# Patient Record
Sex: Female | Born: 1970
Health system: Southern US, Community
[De-identification: ages and names within clinical notes are randomized; demographics above are authoritative.]

## PROBLEM LIST (undated history)

## (undated) ENCOUNTER — Emergency Department: Payer: Commercial Managed Care - PPO

## (undated) DIAGNOSIS — G8929 Other chronic pain: Secondary | ICD-10-CM

## (undated) DIAGNOSIS — G35D Multiple sclerosis, unspecified: Secondary | ICD-10-CM

## (undated) DIAGNOSIS — R635 Abnormal weight gain: Secondary | ICD-10-CM

## (undated) DIAGNOSIS — R5383 Other fatigue: Secondary | ICD-10-CM

## (undated) DIAGNOSIS — J302 Other seasonal allergic rhinitis: Secondary | ICD-10-CM

## (undated) DIAGNOSIS — I499 Cardiac arrhythmia, unspecified: Secondary | ICD-10-CM

## (undated) DIAGNOSIS — G43909 Migraine, unspecified, not intractable, without status migrainosus: Secondary | ICD-10-CM

## (undated) DIAGNOSIS — R0683 Snoring: Secondary | ICD-10-CM

## (undated) DIAGNOSIS — I10 Essential (primary) hypertension: Secondary | ICD-10-CM

## (undated) DIAGNOSIS — R519 Headache, unspecified: Secondary | ICD-10-CM

## (undated) DIAGNOSIS — G35 Multiple sclerosis: Secondary | ICD-10-CM

## (undated) DIAGNOSIS — Z780 Asymptomatic menopausal state: Secondary | ICD-10-CM

## (undated) HISTORY — DX: Snoring: R06.83

## (undated) HISTORY — DX: Other chronic pain: G89.29

## (undated) HISTORY — PX: TONSILLECTOMY: SUR1361

## (undated) HISTORY — DX: Asymptomatic menopausal state: Z78.0

## (undated) HISTORY — DX: Headache, unspecified: R51.9

## (undated) HISTORY — DX: Other fatigue: R53.83

## (undated) HISTORY — DX: Abnormal weight gain: R63.5

## (undated) HISTORY — DX: Multiple sclerosis, unspecified: G35.D

## (undated) HISTORY — DX: Multiple sclerosis: G35

## (undated) HISTORY — PX: TONSILLECTOMY AND ADENOIDECTOMY: SHX28

## (undated) HISTORY — DX: Cardiac arrhythmia, unspecified: I49.9

---

## 2001-08-25 ENCOUNTER — Emergency Department (HOSPITAL_COMMUNITY): Admission: EM | Admit: 2001-08-25 | Discharge: 2001-08-25 | Payer: Self-pay

## 2001-09-17 ENCOUNTER — Emergency Department (HOSPITAL_COMMUNITY): Admission: EM | Admit: 2001-09-17 | Discharge: 2001-09-18 | Payer: Self-pay | Admitting: Emergency Medicine

## 2002-04-02 ENCOUNTER — Other Ambulatory Visit: Admission: RE | Admit: 2002-04-02 | Discharge: 2002-04-02 | Payer: Self-pay | Admitting: Family Medicine

## 2006-12-10 ENCOUNTER — Emergency Department (HOSPITAL_COMMUNITY): Admission: EM | Admit: 2006-12-10 | Discharge: 2006-12-10 | Payer: Self-pay | Admitting: *Deleted

## 2007-09-17 ENCOUNTER — Ambulatory Visit: Payer: Self-pay | Admitting: Gastroenterology

## 2007-09-23 ENCOUNTER — Encounter: Payer: Self-pay | Admitting: Internal Medicine

## 2007-09-23 ENCOUNTER — Ambulatory Visit: Payer: Self-pay | Admitting: Internal Medicine

## 2007-09-30 ENCOUNTER — Ambulatory Visit: Payer: Self-pay | Admitting: Gastroenterology

## 2007-10-08 ENCOUNTER — Ambulatory Visit: Payer: Self-pay | Admitting: Gastroenterology

## 2007-10-15 ENCOUNTER — Ambulatory Visit: Payer: Self-pay | Admitting: Gastroenterology

## 2007-10-23 ENCOUNTER — Ambulatory Visit: Payer: Self-pay | Admitting: Gastroenterology

## 2007-11-06 ENCOUNTER — Ambulatory Visit: Payer: Self-pay | Admitting: Gastroenterology

## 2007-12-30 ENCOUNTER — Ambulatory Visit: Payer: Self-pay | Admitting: Gastroenterology

## 2008-04-06 ENCOUNTER — Emergency Department (HOSPITAL_COMMUNITY): Admission: EM | Admit: 2008-04-06 | Discharge: 2008-04-06 | Payer: Self-pay | Admitting: Family Medicine

## 2008-08-16 ENCOUNTER — Emergency Department (HOSPITAL_COMMUNITY): Admission: EM | Admit: 2008-08-16 | Discharge: 2008-08-16 | Payer: Self-pay | Admitting: Emergency Medicine

## 2011-04-25 LAB — POCT I-STAT CREATININE: Creatinine, Ser: 0.9

## 2011-04-25 LAB — I-STAT 8, (EC8 V) (CONVERTED LAB)
Acid-Base Excess: 2
Bicarbonate: 26.2 — ABNORMAL HIGH
HCT: 47 — ABNORMAL HIGH
Operator id: 208821
TCO2: 27
pCO2, Ven: 39.1 — ABNORMAL LOW

## 2011-06-06 ENCOUNTER — Other Ambulatory Visit: Payer: Self-pay | Admitting: Family Medicine

## 2011-06-06 ENCOUNTER — Ambulatory Visit
Admission: RE | Admit: 2011-06-06 | Discharge: 2011-06-06 | Disposition: A | Discharge status: Home / Self Care | Payer: 59 | Source: Ambulatory Visit | Attending: Family Medicine | Admitting: Family Medicine

## 2011-06-06 DIAGNOSIS — M79673 Pain in unspecified foot: Secondary | ICD-10-CM

## 2012-05-11 ENCOUNTER — Other Ambulatory Visit: Payer: Self-pay | Admitting: Family Medicine

## 2012-05-11 DIAGNOSIS — Z1231 Encounter for screening mammogram for malignant neoplasm of breast: Secondary | ICD-10-CM

## 2012-06-12 ENCOUNTER — Ambulatory Visit
Admission: RE | Admit: 2012-06-12 | Discharge: 2012-06-12 | Disposition: A | Discharge status: Home / Self Care | Payer: 59 | Source: Ambulatory Visit | Attending: Family Medicine | Admitting: Family Medicine

## 2012-06-12 DIAGNOSIS — Z1231 Encounter for screening mammogram for malignant neoplasm of breast: Secondary | ICD-10-CM

## 2013-07-29 ENCOUNTER — Encounter (HOSPITAL_BASED_OUTPATIENT_CLINIC_OR_DEPARTMENT_OTHER): Payer: Self-pay | Admitting: Emergency Medicine

## 2013-07-29 ENCOUNTER — Emergency Department (HOSPITAL_BASED_OUTPATIENT_CLINIC_OR_DEPARTMENT_OTHER)
Admission: EM | Admit: 2013-07-29 | Discharge: 2013-07-29 | Disposition: A | Discharge status: Home / Self Care | Payer: 59 | Attending: Emergency Medicine | Admitting: Emergency Medicine

## 2013-07-29 DIAGNOSIS — G43909 Migraine, unspecified, not intractable, without status migrainosus: Secondary | ICD-10-CM | POA: Insufficient documentation

## 2013-07-29 DIAGNOSIS — Z8709 Personal history of other diseases of the respiratory system: Secondary | ICD-10-CM | POA: Insufficient documentation

## 2013-07-29 HISTORY — DX: Other seasonal allergic rhinitis: J30.2

## 2013-07-29 HISTORY — DX: Migraine, unspecified, not intractable, without status migrainosus: G43.909

## 2013-07-29 MED ORDER — DEXAMETHASONE SODIUM PHOSPHATE 10 MG/ML IJ SOLN
10.0000 mg | Freq: Once | INTRAMUSCULAR | Status: AC
  Administered 2013-07-29: 10 mg via INTRAVENOUS
  Filled 2013-07-29: qty 1

## 2013-07-29 MED ORDER — METOCLOPRAMIDE HCL 5 MG/ML IJ SOLN
10.0000 mg | Freq: Once | INTRAMUSCULAR | Status: AC
  Administered 2013-07-29: 10 mg via INTRAVENOUS
  Filled 2013-07-29: qty 2

## 2013-07-29 MED ORDER — SODIUM CHLORIDE 0.9 % IV BOLUS (SEPSIS)
1000.0000 mL | Freq: Once | INTRAVENOUS | Status: AC
  Administered 2013-07-29: 1000 mL via INTRAVENOUS

## 2013-07-29 MED ORDER — DIPHENHYDRAMINE HCL 50 MG/ML IJ SOLN
25.0000 mg | Freq: Once | INTRAMUSCULAR | Status: AC
  Administered 2013-07-29: 25 mg via INTRAVENOUS
  Filled 2013-07-29: qty 1

## 2013-07-29 NOTE — ED Provider Notes (Addendum)
CSN: [id number]     Arrival date & time 07/29/13  2007 History  This chart was scribed for Blanchie Dessert, MD by Zettie Pho, ED Scribe. This patient was seen in room MH05/MH05 and the patient's care was started at 8:33 PM.    Chief Complaint  Patient presents with  . Migraine   The history is provided by the patient. No language interpreter was used.   HPI Comments: Jamie Watson is a 43 y.o. Female with a history of migraines who presents to the Emergency Department complaining of a constant headache to the left side of her head with associated nausea and multiple episodes of emesis onset about 8.5 hours ago. She states her current symptoms feel similar to her past migraines, but is not as severe. She reports taking Excedrin at home, which she states is usually effective, but was not able to provide relief today. She denies photophobia,  fever, congestion, numbness, paresthesias. Patient has no other pertinent medical history.   Past Medical History  Diagnosis Date  . Migraine   . Seasonal allergies    Past Surgical History  Procedure Laterality Date  . Tonsillectomy     No family history on file. History  Substance Use Topics  . Smoking status: Never Smoker   . Smokeless tobacco: Not on file  . Alcohol Use: Yes   OB History   Grav Para Term Preterm Abortions TAB SAB Ect Mult Living                 Review of Systems  A complete 10 system review of systems was obtained and all systems are negative except as noted in the HPI and PMH.   Allergies  Review of patient's allergies indicates no known allergies.  Home Medications  No current outpatient prescriptions on file.  Triage Vitals: BP 151/111  Pulse 91  Temp(Src) 98.8 F (37.1 C) (Oral)  Resp 20  Ht 5\' 6"  (1.676 m)  Wt 190 lb (86.183 kg)  BMI 30.68 kg/m2  SpO2 100%  LMP 04/28/2013  Physical Exam  Nursing note and vitals reviewed. Constitutional: She is oriented to person, place, and time. She appears  well-developed and well-nourished. No distress.  HENT:  Head: Normocephalic and atraumatic.  Eyes: Conjunctivae and EOM are normal. Pupils are equal, round, and reactive to light.  Mild photophobia.   Neck: Normal range of motion. Neck supple.  Pulmonary/Chest: Effort normal. No respiratory distress.  Abdominal: She exhibits no distension.  Musculoskeletal: Normal range of motion.  Neurological: She is alert and oriented to person, place, and time.  Skin: Skin is warm and dry.  Psychiatric: She has a normal mood and affect. Her behavior is normal.    ED Course  Procedures (including critical care time)  DIAGNOSTIC STUDIES: Oxygen Saturation is 100% on room air, normal by my interpretation.    COORDINATION OF CARE: 8:35 PM- Will order a migraine cocktail (IV fluids, Reglan, Benadryl, and Decadron) to manage symptoms. Discussed treatment plan with patient at bedside and patient verbalized agreement.     Labs Review Labs Reviewed - No data to display Imaging Review No results found.  EKG Interpretation   None       MDM   1. Migraine     Pt with typical migraine HA without sx suggestive of SAH(sudden onset, worst of life, or deficits), infection, or cavernous vein thrombosis.  Normal neuro exam and vital signs. Will give HA cocktail and on re-eval.  9:54 PM Pt feeling much  better and ready to go home. I personally performed the services described in this documentation, which was scribed in my presence.  The recorded information has been reviewed and considered.     Blanchie Dessert, MD 07/29/13 0370  Blanchie Dessert, MD 07/29/13 2155

## 2013-07-29 NOTE — Discharge Instructions (Signed)
Migraine Headache A migraine headache is an intense, throbbing pain on one or both sides of your head. A migraine can last for 30 minutes to several hours. CAUSES  The exact cause of a migraine headache is not always known. However, a migraine may be caused when nerves in the brain become irritated and release chemicals that cause inflammation. This causes pain. Certain things may also trigger migraines, such as:  Alcohol.  Smoking.  Stress.  Menstruation.  Aged cheeses.  Foods or drinks that contain nitrates, glutamate, aspartame, or tyramine.  Lack of sleep.  Chocolate.  Caffeine.  Hunger.  Physical exertion.  Fatigue.  Medicines used to treat chest pain (nitroglycerine), birth control pills, estrogen, and some blood pressure medicines. SIGNS AND SYMPTOMS  Pain on one or both sides of your head.  Pulsating or throbbing pain.  Severe pain that prevents daily activities.  Pain that is aggravated by any physical activity.  Nausea, vomiting, or both.  Dizziness.  Pain with exposure to bright lights, loud noises, or activity.  General sensitivity to bright lights, loud noises, or smells. Before you get a migraine, you may get warning signs that a migraine is coming (aura). An aura may include:  Seeing flashing lights.  Seeing bright spots, halos, or zig-zag lines.  Having tunnel vision or blurred vision.  Having feelings of numbness or tingling.  Having trouble talking.  Having muscle weakness. DIAGNOSIS  A migraine headache is often diagnosed based on:  Symptoms.  Physical exam.  A CT scan or MRI of your head. These imaging tests cannot diagnose migraines, but they can help rule out other causes of headaches. TREATMENT Medicines may be given for pain and nausea. Medicines can also be given to help prevent recurrent migraines.  HOME CARE INSTRUCTIONS  Only take over-the-counter or prescription medicines for pain or discomfort as directed by your  health care provider. The use of long-term narcotics is not recommended.  Lie down in a dark, quiet room when you have a migraine.  Keep a journal to find out what may trigger your migraine headaches. For example, write down:  What you eat and drink.  How much sleep you get.  Any change to your diet or medicines.  Limit alcohol consumption.  Quit smoking if you smoke.  Get 7 9 hours of sleep, or as recommended by your health care provider.  Limit stress.  Keep lights dim if bright lights bother you and make your migraines worse. SEEK IMMEDIATE MEDICAL CARE IF:   Your migraine becomes severe.  You have a fever.  You have a stiff neck.  You have vision loss.  You have muscular weakness or loss of muscle control.  You start losing your balance or have trouble walking.  You feel faint or pass out.  You have severe symptoms that are different from your first symptoms. MAKE SURE YOU:   Understand these instructions.  Will watch your condition.  Will get help right away if you are not doing well or get worse. Document Released: 06/24/2005 Document Revised: 04/14/2013 Document Reviewed: 03/01/2013 [employer] Patient Information 2014 [employer].  

## 2013-07-29 NOTE — ED Notes (Signed)
Migraine, vomiting since 12pm

## 2014-03-07 ENCOUNTER — Other Ambulatory Visit: Payer: Self-pay

## 2014-03-07 DIAGNOSIS — Z1231 Encounter for screening mammogram for malignant neoplasm of breast: Secondary | ICD-10-CM

## 2014-03-16 ENCOUNTER — Ambulatory Visit
Admission: RE | Admit: 2014-03-16 | Discharge: 2014-03-16 | Disposition: A | Discharge status: Home / Self Care | Payer: 59 | Source: Ambulatory Visit

## 2014-03-16 DIAGNOSIS — Z1231 Encounter for screening mammogram for malignant neoplasm of breast: Secondary | ICD-10-CM

## 2014-03-18 ENCOUNTER — Other Ambulatory Visit: Payer: Self-pay | Admitting: Obstetrics and Gynecology

## 2014-03-18 DIAGNOSIS — R928 Other abnormal and inconclusive findings on diagnostic imaging of breast: Secondary | ICD-10-CM

## 2014-03-30 ENCOUNTER — Other Ambulatory Visit: Payer: 59

## 2014-04-07 ENCOUNTER — Ambulatory Visit
Admission: RE | Admit: 2014-04-07 | Discharge: 2014-04-07 | Disposition: A | Discharge status: Home / Self Care | Payer: 59 | Source: Ambulatory Visit | Attending: Obstetrics and Gynecology | Admitting: Obstetrics and Gynecology

## 2014-04-07 DIAGNOSIS — R928 Other abnormal and inconclusive findings on diagnostic imaging of breast: Secondary | ICD-10-CM

## 2015-04-07 ENCOUNTER — Encounter (HOSPITAL_COMMUNITY): Payer: Self-pay

## 2015-04-07 ENCOUNTER — Emergency Department (HOSPITAL_COMMUNITY): Payer: 59

## 2015-04-07 ENCOUNTER — Emergency Department (HOSPITAL_COMMUNITY)
Admission: EM | Admit: 2015-04-07 | Discharge: 2015-04-07 | Disposition: A | Discharge status: Home / Self Care | Payer: 59 | Attending: Emergency Medicine | Admitting: Emergency Medicine

## 2015-04-07 DIAGNOSIS — S29019D Strain of muscle and tendon of unspecified wall of thorax, subsequent encounter: Secondary | ICD-10-CM

## 2015-04-07 DIAGNOSIS — S29012D Strain of muscle and tendon of back wall of thorax, subsequent encounter: Secondary | ICD-10-CM | POA: Diagnosis not present

## 2015-04-07 DIAGNOSIS — Z8679 Personal history of other diseases of the circulatory system: Secondary | ICD-10-CM | POA: Insufficient documentation

## 2015-04-07 DIAGNOSIS — X58XXXD Exposure to other specified factors, subsequent encounter: Secondary | ICD-10-CM | POA: Insufficient documentation

## 2015-04-07 DIAGNOSIS — M542 Cervicalgia: Secondary | ICD-10-CM | POA: Diagnosis not present

## 2015-04-07 DIAGNOSIS — M546 Pain in thoracic spine: Secondary | ICD-10-CM | POA: Diagnosis present

## 2015-04-07 MED ORDER — METHOCARBAMOL 500 MG PO TABS
1000.0000 mg | ORAL_TABLET | Freq: Once | ORAL | Status: AC
  Administered 2015-04-07: 1000 mg via ORAL
  Filled 2015-04-07: qty 2

## 2015-04-07 MED ORDER — TRAMADOL HCL 50 MG PO TABS: 50.0000 mg | ORAL_TABLET | Freq: Four times a day (QID) | ORAL | Status: DC | PRN

## 2015-04-07 MED ORDER — METHOCARBAMOL 500 MG PO TABS: 1000.0000 mg | ORAL_TABLET | Freq: Three times a day (TID) | ORAL | Status: DC | PRN

## 2015-04-07 MED ORDER — KETOROLAC TROMETHAMINE 60 MG/2ML IM SOLN
60.0000 mg | Freq: Once | INTRAMUSCULAR | Status: AC
  Administered 2015-04-07: 60 mg via INTRAMUSCULAR
  Filled 2015-04-07: qty 2

## 2015-04-07 NOTE — ED Notes (Signed)
Per Pt she started having severe back and neck pain two weeks ago; pt states she has seen PCP and was given muscle relaxers and Motrin; Pt states it has not helped; pt denies injury; Pt a&o on arrival

## 2015-04-07 NOTE — ED Provider Notes (Signed)
CSN: [id number]     Arrival date & time 04/07/15  0504 History   First MD Initiated Contact with Patient 04/07/15 0515     Chief Complaint  Patient presents with  . Back Pain  . Neck Pain     (Consider location/radiation/quality/duration/timing/severity/associated sxs/prior Treatment) HPI Patient presents with several weeks of thoracic back pain. States she has had pain in the right trapezius muscle which is improved but now is having pain medial to the right scapula. Pain is worse with movement and palpation. Describes spasms. She denies any known heavy lifting or injury. Has been seen by her primary physician's who is treated her with ibuprofen and a muscle relaxant but is of little help. No fever or chills. No shortness of breath. No weakness or numbness. Past Medical History  Diagnosis Date  . Migraine   . Seasonal allergies    Past Surgical History  Procedure Laterality Date  . Tonsillectomy     No family history on file. Social History  Substance Use Topics  . Smoking status: Never Smoker   . Smokeless tobacco: None  . Alcohol Use: Yes   OB History    No data available     Review of Systems  Constitutional: Negative for fever and chills.  Respiratory: Negative for shortness of breath.   Cardiovascular: Negative for chest pain.  Gastrointestinal: Negative for nausea, vomiting and abdominal pain.  Musculoskeletal: Positive for myalgias, back pain and neck pain. Negative for neck stiffness.  Skin: Negative for rash and wound.  Neurological: Negative for dizziness, weakness, light-headedness, numbness and headaches.  All other systems reviewed and are negative.     Allergies  Review of patient's allergies indicates no known allergies.  Home Medications   Prior to Admission medications   Medication Sig Start Date End Date Taking? Authorizing Provider  methocarbamol (ROBAXIN) 500 MG tablet Take 2 tablets (1,000 mg total) by mouth every 8 (eight) hours as needed  for muscle spasms. 04/07/15   Julianne Rice, MD  traMADol (ULTRAM) 50 MG tablet Take 1 tablet (50 mg total) by mouth every 6 (six) hours as needed. 04/07/15   Julianne Rice, MD   BP 154/107 mmHg  Pulse 84  Temp(Src) 98 F (36.7 C) (Oral)  Resp 17  Ht 5\' 6"  (1.676 m)  Wt 200 lb (90.719 kg)  BMI 32.30 kg/m2  SpO2 98% Physical Exam  Constitutional: She is oriented to person, place, and time. She appears well-developed and well-nourished. No distress.  HENT:  Head: Normocephalic and atraumatic.  Mouth/Throat: Oropharynx is clear and moist.  Eyes: EOM are normal. Pupils are equal, round, and reactive to light.  Neck: Normal range of motion. Neck supple.  No midline cervical tenderness to palpation.  Cardiovascular: Normal rate and regular rhythm.   Pulmonary/Chest: Effort normal and breath sounds normal. No respiratory distress. She has no wheezes. She has no rales.  Abdominal: Soft. Bowel sounds are normal.  Musculoskeletal: Normal range of motion. She exhibits tenderness. She exhibits no edema.  Patient with tenderness to palpation over the right trapezius and right rhomboid muscles. No midline thoracic tenderness  Neurological: She is alert and oriented to person, place, and time.  5/5 grip strength bilaterally. Sensation is fully intact.  Skin: Skin is warm and dry. No rash noted. No erythema.  Psychiatric: She has a normal mood and affect. Her behavior is normal.  Nursing note and vitals reviewed.   ED Course  Procedures (including critical care time) Labs Review Labs Reviewed - No  data to display  Imaging Review Dg Thoracic Spine 2 View  04/07/2015   CLINICAL DATA:  Thoracic back pain for 2 weeks.  No known injury.  EXAM: THORACIC SPINE 2 VIEWS  COMPARISON:  None.  FINDINGS: No evidence of fracture, subluxation, endplate erosion, or focal bone lesion. No notable degenerative change.  IMPRESSION: Negative.   Electronically Signed   By: Monte Fantasia M.D.   On: 04/07/2015  06:07   I have personally reviewed and evaluated these images and lab results as part of my medical decision-making.   EKG Interpretation None      MDM   Final diagnoses:  Thoracic myofascial strain, subsequent encounter    Patient with ongoing musculoskeletal thoracic back pain. No definite injuries. Concern that thoracic strain may be due to large breast size. Advised more supportive undergarments and consult with general surgery as needed.    Julianne Rice, MD 04/07/15 [phone]

## 2015-04-07 NOTE — Discharge Instructions (Signed)

## 2015-04-07 NOTE — ED Notes (Signed)
Patient transported to X-ray 

## 2015-04-07 NOTE — ED Notes (Signed)
MD at bedside. 

## 2015-08-11 DIAGNOSIS — J329 Chronic sinusitis, unspecified: Secondary | ICD-10-CM | POA: Diagnosis not present

## 2015-08-13 DIAGNOSIS — B9689 Other specified bacterial agents as the cause of diseases classified elsewhere: Secondary | ICD-10-CM | POA: Diagnosis not present

## 2015-08-13 DIAGNOSIS — J22 Unspecified acute lower respiratory infection: Secondary | ICD-10-CM | POA: Diagnosis not present

## 2015-08-13 DIAGNOSIS — J019 Acute sinusitis, unspecified: Secondary | ICD-10-CM | POA: Diagnosis not present

## 2015-11-13 DIAGNOSIS — Z1231 Encounter for screening mammogram for malignant neoplasm of breast: Secondary | ICD-10-CM | POA: Diagnosis not present

## 2015-11-13 DIAGNOSIS — Z3A34 34 weeks gestation of pregnancy: Secondary | ICD-10-CM | POA: Diagnosis not present

## 2015-11-13 DIAGNOSIS — Z01419 Encounter for gynecological examination (general) (routine) without abnormal findings: Secondary | ICD-10-CM | POA: Diagnosis not present

## 2015-11-13 DIAGNOSIS — Z1151 Encounter for screening for human papillomavirus (HPV): Secondary | ICD-10-CM | POA: Diagnosis not present

## 2015-12-20 DIAGNOSIS — H524 Presbyopia: Secondary | ICD-10-CM | POA: Diagnosis not present

## 2015-12-20 DIAGNOSIS — D3122 Benign neoplasm of left retina: Secondary | ICD-10-CM | POA: Diagnosis not present

## 2015-12-20 DIAGNOSIS — D3121 Benign neoplasm of right retina: Secondary | ICD-10-CM | POA: Diagnosis not present

## 2016-01-16 DIAGNOSIS — J209 Acute bronchitis, unspecified: Secondary | ICD-10-CM | POA: Diagnosis not present

## 2016-01-16 DIAGNOSIS — R05 Cough: Secondary | ICD-10-CM | POA: Diagnosis not present

## 2016-01-16 MED FILL — AZITHROMYCIN 250 MG TABLET: 250 | 5 days supply | Qty: 6 | Fill #0

## 2016-04-18 DIAGNOSIS — R3915 Urgency of urination: Secondary | ICD-10-CM | POA: Diagnosis not present

## 2016-06-01 DIAGNOSIS — J069 Acute upper respiratory infection, unspecified: Secondary | ICD-10-CM | POA: Diagnosis not present

## 2016-08-22 DIAGNOSIS — M542 Cervicalgia: Secondary | ICD-10-CM | POA: Diagnosis not present

## 2016-08-22 DIAGNOSIS — E785 Hyperlipidemia, unspecified: Secondary | ICD-10-CM | POA: Diagnosis not present

## 2016-08-22 DIAGNOSIS — I10 Essential (primary) hypertension: Secondary | ICD-10-CM | POA: Diagnosis not present

## 2016-08-22 MED FILL — traMADol HCL 50 MG TABS: 50 | 30 days supply | Qty: 30 | Fill #0

## 2016-10-08 DIAGNOSIS — R05 Cough: Secondary | ICD-10-CM | POA: Diagnosis not present

## 2016-10-08 DIAGNOSIS — J028 Acute pharyngitis due to other specified organisms: Secondary | ICD-10-CM | POA: Diagnosis not present

## 2016-10-08 MED FILL — AZITHROMYCIN 250 MG TABLET: 250 | 5 days supply | Qty: 6 | Fill #0

## 2017-03-31 DIAGNOSIS — R05 Cough: Secondary | ICD-10-CM | POA: Diagnosis not present

## 2017-03-31 DIAGNOSIS — J988 Other specified respiratory disorders: Secondary | ICD-10-CM | POA: Diagnosis not present

## 2017-05-27 DIAGNOSIS — Z1151 Encounter for screening for human papillomavirus (HPV): Secondary | ICD-10-CM | POA: Diagnosis not present

## 2017-05-27 DIAGNOSIS — Z1231 Encounter for screening mammogram for malignant neoplasm of breast: Secondary | ICD-10-CM | POA: Diagnosis not present

## 2017-05-27 DIAGNOSIS — Z01419 Encounter for gynecological examination (general) (routine) without abnormal findings: Secondary | ICD-10-CM | POA: Diagnosis not present

## 2017-05-27 DIAGNOSIS — Z6833 Body mass index (BMI) 33.0-33.9, adult: Secondary | ICD-10-CM | POA: Diagnosis not present

## 2017-10-08 MED FILL — TINIDAZOLE 500 MG TABS: 500 | 2 days supply | Qty: 8 | Fill #0

## 2018-03-02 DIAGNOSIS — D3121 Benign neoplasm of right retina: Secondary | ICD-10-CM | POA: Diagnosis not present

## 2018-03-02 DIAGNOSIS — D3122 Benign neoplasm of left retina: Secondary | ICD-10-CM | POA: Diagnosis not present

## 2018-03-02 DIAGNOSIS — H5203 Hypermetropia, bilateral: Secondary | ICD-10-CM | POA: Diagnosis not present

## 2018-05-15 DIAGNOSIS — Z79899 Other long term (current) drug therapy: Secondary | ICD-10-CM | POA: Diagnosis not present

## 2018-05-15 DIAGNOSIS — I10 Essential (primary) hypertension: Secondary | ICD-10-CM | POA: Diagnosis not present

## 2018-05-15 DIAGNOSIS — N912 Amenorrhea, unspecified: Secondary | ICD-10-CM | POA: Diagnosis not present

## 2018-06-02 MED FILL — TINIDAZOLE 500 MG TABLET: 500 | 2 days supply | Qty: 8 | Fill #0

## 2018-07-28 DIAGNOSIS — Z6836 Body mass index (BMI) 36.0-36.9, adult: Secondary | ICD-10-CM | POA: Diagnosis not present

## 2018-07-28 DIAGNOSIS — Z01419 Encounter for gynecological examination (general) (routine) without abnormal findings: Secondary | ICD-10-CM | POA: Diagnosis not present

## 2018-07-28 DIAGNOSIS — Z1231 Encounter for screening mammogram for malignant neoplasm of breast: Secondary | ICD-10-CM | POA: Diagnosis not present

## 2018-07-28 DIAGNOSIS — Z1151 Encounter for screening for human papillomavirus (HPV): Secondary | ICD-10-CM | POA: Diagnosis not present

## 2018-07-28 DIAGNOSIS — N898 Other specified noninflammatory disorders of vagina: Secondary | ICD-10-CM | POA: Diagnosis not present

## 2018-08-03 DIAGNOSIS — I10 Essential (primary) hypertension: Secondary | ICD-10-CM | POA: Diagnosis not present

## 2018-08-03 DIAGNOSIS — Z0130 Encounter for examination of blood pressure without abnormal findings: Secondary | ICD-10-CM | POA: Diagnosis not present

## 2018-10-10 DIAGNOSIS — R197 Diarrhea, unspecified: Secondary | ICD-10-CM | POA: Diagnosis not present

## 2018-10-10 DIAGNOSIS — R1084 Generalized abdominal pain: Secondary | ICD-10-CM | POA: Diagnosis not present

## 2018-10-10 DIAGNOSIS — Z9189 Other specified personal risk factors, not elsewhere classified: Secondary | ICD-10-CM | POA: Diagnosis not present

## 2018-12-08 DIAGNOSIS — N915 Oligomenorrhea, unspecified: Secondary | ICD-10-CM | POA: Diagnosis not present

## 2018-12-08 DIAGNOSIS — Z0000 Encounter for general adult medical examination without abnormal findings: Secondary | ICD-10-CM | POA: Diagnosis not present

## 2018-12-08 DIAGNOSIS — R0602 Shortness of breath: Secondary | ICD-10-CM | POA: Diagnosis not present

## 2018-12-08 DIAGNOSIS — E559 Vitamin D deficiency, unspecified: Secondary | ICD-10-CM | POA: Diagnosis not present

## 2018-12-08 DIAGNOSIS — Z114 Encounter for screening for human immunodeficiency virus [HIV]: Secondary | ICD-10-CM | POA: Diagnosis not present

## 2018-12-08 DIAGNOSIS — M129 Arthropathy, unspecified: Secondary | ICD-10-CM | POA: Diagnosis not present

## 2018-12-08 DIAGNOSIS — R5383 Other fatigue: Secondary | ICD-10-CM | POA: Diagnosis not present

## 2018-12-09 ENCOUNTER — Other Ambulatory Visit: Payer: Self-pay | Admitting: Nurse Practitioner

## 2018-12-09 ENCOUNTER — Other Ambulatory Visit: Payer: Self-pay | Admitting: Specialist

## 2018-12-09 ENCOUNTER — Other Ambulatory Visit (HOSPITAL_COMMUNITY): Payer: Self-pay | Admitting: Nurse Practitioner

## 2018-12-09 DIAGNOSIS — M7989 Other specified soft tissue disorders: Secondary | ICD-10-CM

## 2018-12-09 DIAGNOSIS — E049 Nontoxic goiter, unspecified: Secondary | ICD-10-CM

## 2018-12-16 DIAGNOSIS — Z7251 High risk heterosexual behavior: Secondary | ICD-10-CM | POA: Diagnosis not present

## 2018-12-16 DIAGNOSIS — M129 Arthropathy, unspecified: Secondary | ICD-10-CM | POA: Diagnosis not present

## 2018-12-16 DIAGNOSIS — R0602 Shortness of breath: Secondary | ICD-10-CM | POA: Diagnosis not present

## 2018-12-16 DIAGNOSIS — R9431 Abnormal electrocardiogram [ECG] [EKG]: Secondary | ICD-10-CM | POA: Diagnosis not present

## 2018-12-23 ENCOUNTER — Ambulatory Visit (HOSPITAL_COMMUNITY): Payer: 59 | Attending: Nurse Practitioner

## 2018-12-23 ENCOUNTER — Encounter (HOSPITAL_COMMUNITY): Payer: Self-pay

## 2018-12-23 ENCOUNTER — Ambulatory Visit (HOSPITAL_COMMUNITY): Admission: RE | Admit: 2018-12-23 | Payer: 59 | Source: Ambulatory Visit

## 2019-01-12 ENCOUNTER — Other Ambulatory Visit: Payer: Self-pay | Admitting: *Deleted

## 2019-01-12 DIAGNOSIS — R7303 Prediabetes: Secondary | ICD-10-CM | POA: Diagnosis not present

## 2019-01-12 DIAGNOSIS — R945 Abnormal results of liver function studies: Secondary | ICD-10-CM | POA: Diagnosis not present

## 2019-01-12 DIAGNOSIS — E559 Vitamin D deficiency, unspecified: Secondary | ICD-10-CM | POA: Diagnosis not present

## 2019-01-12 DIAGNOSIS — Z79899 Other long term (current) drug therapy: Secondary | ICD-10-CM | POA: Diagnosis not present

## 2019-01-12 DIAGNOSIS — F419 Anxiety disorder, unspecified: Secondary | ICD-10-CM | POA: Diagnosis not present

## 2019-01-12 DIAGNOSIS — M109 Gout, unspecified: Secondary | ICD-10-CM | POA: Diagnosis not present

## 2019-01-12 DIAGNOSIS — Z20822 Contact with and (suspected) exposure to covid-19: Secondary | ICD-10-CM

## 2019-01-12 DIAGNOSIS — I10 Essential (primary) hypertension: Secondary | ICD-10-CM | POA: Diagnosis not present

## 2019-01-16 NOTE — Addendum Note (Signed)
Addended by: Brigitte Pulse on: 01/16/2019 10:05 AM   Modules accepted: Orders

## 2019-01-25 ENCOUNTER — Ambulatory Visit (HOSPITAL_COMMUNITY)
Admission: RE | Admit: 2019-01-25 | Discharge: 2019-01-25 | Disposition: A | Discharge status: Home / Self Care | Payer: 59 | Source: Ambulatory Visit | Attending: Nurse Practitioner | Admitting: Nurse Practitioner

## 2019-01-25 ENCOUNTER — Other Ambulatory Visit: Payer: Self-pay

## 2019-01-25 DIAGNOSIS — R2230 Localized swelling, mass and lump, unspecified upper limb: Secondary | ICD-10-CM | POA: Insufficient documentation

## 2019-01-25 DIAGNOSIS — E049 Nontoxic goiter, unspecified: Secondary | ICD-10-CM

## 2019-01-25 DIAGNOSIS — E0789 Other specified disorders of thyroid: Secondary | ICD-10-CM | POA: Diagnosis not present

## 2019-01-25 DIAGNOSIS — R2231 Localized swelling, mass and lump, right upper limb: Secondary | ICD-10-CM | POA: Diagnosis not present

## 2019-01-25 DIAGNOSIS — M7989 Other specified soft tissue disorders: Secondary | ICD-10-CM

## 2019-01-28 DIAGNOSIS — R945 Abnormal results of liver function studies: Secondary | ICD-10-CM | POA: Diagnosis not present

## 2019-01-28 DIAGNOSIS — M79642 Pain in left hand: Secondary | ICD-10-CM | POA: Diagnosis not present

## 2019-02-23 DIAGNOSIS — R7303 Prediabetes: Secondary | ICD-10-CM | POA: Diagnosis not present

## 2019-02-23 DIAGNOSIS — I10 Essential (primary) hypertension: Secondary | ICD-10-CM | POA: Diagnosis not present

## 2019-02-23 DIAGNOSIS — R945 Abnormal results of liver function studies: Secondary | ICD-10-CM | POA: Diagnosis not present

## 2019-02-23 DIAGNOSIS — E559 Vitamin D deficiency, unspecified: Secondary | ICD-10-CM | POA: Diagnosis not present

## 2019-03-12 ENCOUNTER — Other Ambulatory Visit: Payer: Self-pay | Admitting: Nurse Practitioner

## 2019-03-25 ENCOUNTER — Other Ambulatory Visit: Payer: Self-pay | Admitting: Nurse Practitioner

## 2019-03-25 DIAGNOSIS — M7989 Other specified soft tissue disorders: Secondary | ICD-10-CM

## 2019-04-12 ENCOUNTER — Encounter (HOSPITAL_BASED_OUTPATIENT_CLINIC_OR_DEPARTMENT_OTHER): Payer: Self-pay | Admitting: Emergency Medicine

## 2019-04-12 ENCOUNTER — Emergency Department (HOSPITAL_BASED_OUTPATIENT_CLINIC_OR_DEPARTMENT_OTHER)
Admission: EM | Admit: 2019-04-12 | Discharge: 2019-04-12 | Disposition: A | Discharge status: Home / Self Care | Payer: 59 | Attending: Emergency Medicine | Admitting: Emergency Medicine

## 2019-04-12 ENCOUNTER — Emergency Department (HOSPITAL_BASED_OUTPATIENT_CLINIC_OR_DEPARTMENT_OTHER): Payer: 59

## 2019-04-12 ENCOUNTER — Other Ambulatory Visit: Payer: Self-pay

## 2019-04-12 DIAGNOSIS — M25562 Pain in left knee: Secondary | ICD-10-CM

## 2019-04-12 DIAGNOSIS — S80212A Abrasion, left knee, initial encounter: Secondary | ICD-10-CM | POA: Diagnosis not present

## 2019-04-12 NOTE — ED Provider Notes (Signed)
[facility] Provider Note   CSN: [id number] Arrival date & time: 04/12/19  1053     History   Chief Complaint Chief Complaint  Patient presents with  . Knee Injury    HPI Jamie Watson is a 48 y.o. female who presents to the ED today planing of gradual onset, constant, achy, left knee pain status post mechanical fall that occurred 2 weeks ago.  Per triage report patient states that her fall was this morning but she is adamant that she fell 2 weeks ago while taking a walk.  She states she landed on both of her knees but is only been having continued pain to the left knee.  Has a small abrasion to the left knee which she has been applying Neosporin to.  Denies erythema, edema, drainage.  She is not a diabetic.  She has been taking over-the-counter Excedrin with mild relief is applying ice and elevating her knee.  No previous injuries to the knee.  No previous surgeries to the knee.  No other complaints at this time.        Past Medical History:  Diagnosis Date  . Migraine   . Seasonal allergies     There are no active problems to display for this patient.   Past Surgical History:  Procedure Laterality Date  . TONSILLECTOMY       OB History   No obstetric history on file.      Home Medications    Prior to Admission medications   Medication Sig Start Date End Date Taking? Authorizing Provider  methocarbamol (ROBAXIN) 500 MG tablet Take 2 tablets (1,000 mg total) by mouth every 8 (eight) hours as needed for muscle spasms. 04/07/15   Julianne Rice, MD  traMADol (ULTRAM) 50 MG tablet Take 1 tablet (50 mg total) by mouth every 6 (six) hours as needed. 04/07/15   Julianne Rice, MD    Family History No family history on file.  Social History Social History   Tobacco Use  . Smoking status: Never Smoker  Substance Use Topics  . Alcohol use: Yes  . Drug use: No     Allergies   Patient has no known allergies.   Review of  Systems Review of Systems  Constitutional: Negative for chills and fever.  Musculoskeletal: Positive for arthralgias.  Skin: Positive for wound.  Neurological: Negative for weakness and numbness.     Physical Exam Updated Vital Signs BP (!) 142/93 (BP Location: Left Arm)   Temp 98.4 F (36.9 C) (Oral)   Resp 17   Ht 5\' 7"  (1.702 m)   Wt 93 kg   SpO2 100%   BMI 32.11 kg/m   Physical Exam Vitals signs and nursing note reviewed.  Constitutional:      Appearance: She is not ill-appearing.  HENT:     Head: Normocephalic and atraumatic.  Eyes:     Conjunctiva/sclera: Conjunctivae normal.  Cardiovascular:     Rate and Rhythm: Normal rate and regular rhythm.     Pulses: Normal pulses.  Pulmonary:     Effort: Pulmonary effort is normal.     Breath sounds: Normal breath sounds. No wheezing, rhonchi or rales.  Musculoskeletal:     Comments: No obvious swelling noted to left knee compared to right.  Mild abrasion noted to the lateral aspect of the knee.  No erythema, edema, drainage around the abrasion.  Patient does have tenderness to palpation along the lateral aspect of her knee as well as  the patellar tendon.  Mild crepitus range of motion.  Range of motion is intact with knee flexion and knee extension.  Strength 5 out of 5 bilaterally.  Sensation intact throughout.  No tenderness to the left hip or the left ankle.  2+ PT and DP pulse.  Negative anterior and posterior drawer test.  No varus or valgus laxity.   Skin:    General: Skin is warm and dry.     Coloration: Skin is not jaundiced.  Neurological:     Mental Status: She is alert.      ED Treatments / Results  Labs (all labs ordered are listed, but only abnormal results are displayed) Labs Reviewed - No data to display  EKG None  Radiology Dg Knee Complete 4 Views Left  Result Date: 04/12/2019 CLINICAL DATA:  Patient states that she fell 2 weeks ago, having left anterior knee pains with abrasion, no other  complaints EXAM: LEFT KNEE - COMPLETE 4+ VIEW COMPARISON:  None. FINDINGS: No evidence of fracture, dislocation, or joint effusion. Normal mineralization. No evidence of arthropathy or other focal bone abnormality. Soft tissues are unremarkable. IMPRESSION: Negative left knee radiographs. Electronically Signed   By: Audie Pinto M.D.   On: 04/12/2019 11:50    Procedures Procedures (including critical care time)  Medications Ordered in ED Medications - No data to display   Initial Impression / Assessment and Plan / ED Course  I have reviewed the triage vital signs and the nursing notes.  Pertinent labs & imaging results that were available during my care of the patient were reviewed by me and considered in my medical decision making (see chart for details).    48 year old female who presents to the ED today complaining of left knee pain status post mechanical fall that occurred 2 weeks ago.  No obvious swelling noted to the knee but patient does have some tenderness to palpation.  No Ligamentous injury present on exam.  Will obtain x-ray at this time reevaluate.  Patient will likely need to follow-up with sports medicine.   X ray negative at this time. Have ordered knee sleeve for patient for comfort. She is advised to wear while she is on her feet. RICE therapy discussed. Pt advised to take Ibuprofen and Tylenol PRN for pain. She is to follow up with sports medicine for further evaluation. Strict return precautions have been discussed. Pt is in agreement with plan at this time and stable for discharge home.   This note was prepared using Dragon voice recognition software and may include unintentional dictation errors due to the inherent limitations of voice recognition software.       Final Clinical Impressions(s) / ED Diagnoses   Final diagnoses:  Acute pain of left knee    ED Discharge Orders    None       Eustaquio Maize, PA-C 04/12/19 1232    Virgel Manifold, MD  04/12/19 1406

## 2019-04-12 NOTE — Discharge Instructions (Signed)
Your xray was negative for any breaks today Please wear knee sleeve for comfort. While at home I recommend resting it, icing the knee, and elevating to reduce swelling/inflammation You can take Ibuprofen and Tylenol as needed for the pain. I would recommend 600 mg Ibuprofen every 6-8 hours and 1,000 mg Tylenol every 8 hours.  Please follow up with Dr. Raeford Razor with sports medicine for further evaluation.

## 2019-04-12 NOTE — ED Triage Notes (Addendum)
Pt presents with c/o left knee pain after a fall this morning. Pt was out on morning walk when she tripped and feel landing on both knees. Pt reports pain to left shin and left inner thigh. Abrasion to left knee. No difficulty walking.

## 2019-04-16 ENCOUNTER — Ambulatory Visit
Admission: RE | Admit: 2019-04-16 | Discharge: 2019-04-16 | Disposition: A | Discharge status: Home / Self Care | Payer: 59 | Source: Ambulatory Visit | Attending: Nurse Practitioner | Admitting: Nurse Practitioner

## 2019-04-16 ENCOUNTER — Other Ambulatory Visit: Payer: Self-pay

## 2019-04-16 DIAGNOSIS — M7989 Other specified soft tissue disorders: Secondary | ICD-10-CM

## 2019-04-19 ENCOUNTER — Other Ambulatory Visit: Payer: Self-pay

## 2019-04-19 ENCOUNTER — Ambulatory Visit (INDEPENDENT_AMBULATORY_CARE_PROVIDER_SITE_OTHER): Payer: 59 | Admitting: Family Medicine

## 2019-04-19 ENCOUNTER — Encounter: Payer: Self-pay | Admitting: Family Medicine

## 2019-04-19 ENCOUNTER — Ambulatory Visit: Payer: Self-pay

## 2019-04-19 VITALS — BP 125/90 | HR 98 | Ht 66.0 in | Wt 205.0 lb

## 2019-04-19 DIAGNOSIS — M25562 Pain in left knee: Secondary | ICD-10-CM

## 2019-04-19 DIAGNOSIS — M79601 Pain in right arm: Secondary | ICD-10-CM

## 2019-04-19 HISTORY — DX: Pain in right arm: M79.601

## 2019-04-19 HISTORY — DX: Pain in left knee: M25.562

## 2019-04-19 NOTE — Assessment & Plan Note (Signed)
Received a flu shot and had a nodule appreciated in this area.  Appears to be a change in the subcutaneous fat.  Does not appear to be involving the muscle and no vascular uptake in this area. -Can monitor. -Has a MRI scheduled by her primary care.

## 2019-04-19 NOTE — Assessment & Plan Note (Signed)
Findings on ultrasound would suggest a medial plateau fracture or a proximal medial tibia fracture.  Appears nondisplaced and was observed on x-ray.  No significant effusion in the joint. -Counseled on nonweightbearing. -MRI to evaluate for fracture.  Open MRI as she does not tolerate closed MRI. -Counseled on vitamin D, calcium, and vitamin K2. -Rollator. -Would consider physical therapy at follow-up.

## 2019-04-19 NOTE — Patient Instructions (Signed)
Nice to meet you Please try ice and tylenol  Please try to avoid weightbearing  Please try vitamin K2 Please take vitamin D and calcium  Please send me a message in MyChart with any questions or updates.  We will schedule an appointment after the MRI.   --Dr. Raeford Razor

## 2019-04-19 NOTE — Progress Notes (Signed)
Jamie Watson - 48 y.o. female MRN [id number]  Date of birth: 1970/07/18  SUBJECTIVE:  Including CC & ROS.  Chief Complaint  Patient presents with  . Knee Injury    left knee x 3 weeks ago    Jamie Watson is a 48 y.o. female that is presenting with acute left knee pain.  She fell about 3 weeks ago onto each knee.  She denies much improvement up into this point.  She is having significant pain to touch in the medial joint space.  Denies any mechanical symptoms.  The pain is worse at the end of the day.  The pain is severe.  Seems to be localized to the knee.  Has not had any improvement with medications.  No prior history of knee injury or surgery.  Denies any numbness or tingling.  Pain is becoming more constant..  Independent review of the left knee x-ray from 10/5 shows no acute abnormality.   Review of Systems  Constitutional: Negative for fever.  HENT: Negative for congestion.   Respiratory: Negative for cough.   Cardiovascular: Negative for chest pain.  Gastrointestinal: Negative for abdominal pain.  Musculoskeletal: Positive for gait problem.  Skin: Negative for color change.  Neurological: Negative for weakness.  Hematological: Negative for adenopathy.    HISTORY: Past Medical, Surgical, Social, and Family History Reviewed & Updated per EMR.   Pertinent Historical Findings include:  Past Medical History:  Diagnosis Date  . Migraine   . Seasonal allergies     Past Surgical History:  Procedure Laterality Date  . TONSILLECTOMY      No Known Allergies  No family history on file.   Social History   Socioeconomic History  . Marital status: Legally Separated    Spouse name: Not on file  . Number of children: Not on file  . Years of education: Not on file  . Highest education level: Not on file  Occupational History  . Not on file  Social Needs  . Financial resource strain: Not on file  . Food insecurity    Worry: Not on file    Inability: Not on file  .  Transportation needs    Medical: Not on file    Non-medical: Not on file  Tobacco Use  . Smoking status: Never Smoker  . Smokeless tobacco: Never Used  Substance and Sexual Activity  . Alcohol use: Yes  . Drug use: No  . Sexual activity: Not on file  Lifestyle  . Physical activity    Days per week: Not on file    Minutes per session: Not on file  . Stress: Not on file  Relationships  . Social Herbalist on phone: Not on file    Gets together: Not on file    Attends religious service: Not on file    Active member of club or organization: Not on file    Attends meetings of clubs or organizations: Not on file    Relationship status: Not on file  . Intimate partner violence    Fear of current or ex partner: Not on file    Emotionally abused: Not on file    Physically abused: Not on file    Forced sexual activity: Not on file  Other Topics Concern  . Not on file  Social History Narrative  . Not on file     PHYSICAL EXAM:  VS: BP 125/90   Pulse 98   Ht 5\' 6"  (1.676 m)  Wt 205 lb (93 kg)   BMI 33.09 kg/m  Physical Exam Gen: NAD, alert, cooperative with exam, well-appearing ENT: normal lips, normal nasal mucosa,  Eye: normal EOM, normal conjunctiva and lids CV:  no edema, +2 pedal pulses   Resp: no accessory muscle use, non-labored,  GI: no masses or tenderness, no hernia  Skin: no rashes, no areas of induration  Neuro: normal tone, normal sensation to touch Psych:  normal insight, alert and oriented MSK:  Left knee: No obvious swelling. Tenderness to palpation over the medial joint line and proximal tibia. Normal range of motion. No instability with valgus or varus stress testing. No pain with patellar grind. Negative McMurray's test. Neurovascular intact  Limited ultrasound: Left knee, right arm:  Left knee: Normal appearing quadricep and patellar tendon. No effusion within the suprapatellar pouch. Normal joint space of the medial joint line  with mild degenerative changes of the medial meniscus. There is significant vascular uptake in the medial tibial plateau which would suggest a fracture.  Right arm: There is a finding suggestive of a loculation with homogeneous tissue at the site where she sees her flu shot.  No vascularity of this area.  No involvement of the muscle or fascia  Summary: Findings suggestive of medial plateau fracture.  Right arm appears to be fat necrosis from a flu shot.  Ultrasound and interpretation by Clearance Coots, MD      ASSESSMENT & PLAN:   Acute pain of left knee Findings on ultrasound would suggest a medial plateau fracture or a proximal medial tibia fracture.  Appears nondisplaced and was observed on x-ray.  No significant effusion in the joint. -Counseled on nonweightbearing. -MRI to evaluate for fracture.  Open MRI as she does not tolerate closed MRI. -Counseled on vitamin D, calcium, and vitamin K2. -Rollator. -Would consider physical therapy at follow-up.  Right arm pain Received a flu shot and had a nodule appreciated in this area.  Appears to be a change in the subcutaneous fat.  Does not appear to be involving the muscle and no vascular uptake in this area. -Can monitor. -Has a MRI scheduled by her primary care.

## 2019-04-21 MED FILL — VIT D2 1.25 MG (50,000 UNIT: 1.25 MG | 84 days supply | Qty: 12 | Fill #0

## 2019-04-26 ENCOUNTER — Other Ambulatory Visit: Payer: Self-pay | Admitting: Nurse Practitioner

## 2019-04-26 ENCOUNTER — Telehealth: Payer: Self-pay | Admitting: Family Medicine

## 2019-04-26 DIAGNOSIS — M7989 Other specified soft tissue disorders: Secondary | ICD-10-CM

## 2019-04-26 MED ORDER — DIAZEPAM 5 MG PO TABS: ORAL_TABLET | ORAL | 0 refills | Status: DC

## 2019-04-26 MED FILL — diazePAM 5 MG TABS: 5 | 1 days supply | Qty: 2 | Fill #0

## 2019-04-26 NOTE — Telephone Encounter (Signed)
Provided valium for MRI.   Rosemarie Ax, MD [facility] 04/26/2019, 1:25 PM

## 2019-04-26 NOTE — Telephone Encounter (Signed)
Patient is requesting medication to take while getting her MRI done to help with her claustrophobia

## 2019-05-06 MED FILL — diazePAM 5 MG TABS: 5 | 1 days supply | Qty: 2 | Fill #0

## 2019-05-07 MED FILL — HYDROCHLOROTHIAZIDE 25 MG T: 25 | 90 days supply | Qty: 90 | Fill #0

## 2019-05-14 ENCOUNTER — Ambulatory Visit
Admission: RE | Admit: 2019-05-14 | Discharge: 2019-05-14 | Disposition: A | Discharge status: Home / Self Care | Payer: 59 | Source: Ambulatory Visit | Attending: Family Medicine | Admitting: Family Medicine

## 2019-05-14 ENCOUNTER — Ambulatory Visit
Admission: RE | Admit: 2019-05-14 | Discharge: 2019-05-14 | Disposition: A | Discharge status: Home / Self Care | Payer: 59 | Source: Ambulatory Visit | Attending: Nurse Practitioner | Admitting: Nurse Practitioner

## 2019-05-14 ENCOUNTER — Other Ambulatory Visit: Payer: Self-pay

## 2019-05-14 DIAGNOSIS — S8002XA Contusion of left knee, initial encounter: Secondary | ICD-10-CM | POA: Diagnosis not present

## 2019-05-14 DIAGNOSIS — M7989 Other specified soft tissue disorders: Secondary | ICD-10-CM

## 2019-05-14 DIAGNOSIS — R2231 Localized swelling, mass and lump, right upper limb: Secondary | ICD-10-CM | POA: Diagnosis not present

## 2019-05-14 DIAGNOSIS — M25562 Pain in left knee: Secondary | ICD-10-CM

## 2019-05-14 MED ORDER — GADOBENATE DIMEGLUMINE 529 MG/ML IV SOLN
19.0000 mL | Freq: Once | INTRAVENOUS | Status: AC | PRN
  Administered 2019-05-14: 19 mL via INTRAVENOUS

## 2019-05-17 ENCOUNTER — Other Ambulatory Visit: Payer: Self-pay

## 2019-05-17 ENCOUNTER — Ambulatory Visit (INDEPENDENT_AMBULATORY_CARE_PROVIDER_SITE_OTHER): Payer: 59 | Admitting: Family Medicine

## 2019-05-17 DIAGNOSIS — M25562 Pain in left knee: Secondary | ICD-10-CM | POA: Diagnosis not present

## 2019-05-17 NOTE — Progress Notes (Signed)
Virtual Visit via Video Note  I connected with ADELLA PINKOS on 05/17/19 at  1:30 PM EST by a video enabled telemedicine application and verified that I am speaking with the correct person using two identifiers.   I discussed the limitations of evaluation and management by telemedicine and the availability of in person appointments. The patient expressed understanding and agreed to proceed.  History of Present Illness:  Ms. Germani is a 48 year old female that is following up for her left knee MRI.  She is still experiencing pain.  She initially fell on her knee.  The MRI was demonstrating an osseous contusion at the mid anterior tibia which is likely the source of her pain.  There are other degenerative changes within the knee that may also be contributing.  She still has pain but it has improved since being seen previously.   Observations/Objective:  Gen: NAD, alert, cooperative with exam, well-appearing ENT: normal lips, normal nasal mucosa,  Eye: normal EOM, normal conjunctiva and lids Resp: no accessory muscle use, non-labored,  Psych:  normal insight, alert and oriented  Assessment and Plan:  Left knee pain:  Pain has improved but still ongoing.  MRI was revealing for likely a contusion which is likely the main source of her pain.  She does have degenerative changes and cystic changes observed on the MRI. -Counseled on home exercise therapy and supportive care. -Could consider injection or physical therapy.  Follow Up Instructions:    I discussed the assessment and treatment plan with the patient. The patient was provided an opportunity to ask questions and all were answered. The patient agreed with the plan and demonstrated an understanding of the instructions.   The patient was advised to call back or seek an in-person evaluation if the symptoms worsen or if the condition fails to improve as anticipated.   Clearance Coots, MD

## 2019-05-17 NOTE — Assessment & Plan Note (Signed)
Pain has improved but still ongoing.  MRI was revealing for likely a contusion which is likely the main source of her pain.  She does have degenerative changes and cystic changes observed on the MRI. -Counseled on home exercise therapy and supportive care. -Could consider injection or physical therapy.

## 2019-05-25 ENCOUNTER — Telehealth: Payer: Self-pay | Admitting: Family Medicine

## 2019-05-25 NOTE — Telephone Encounter (Signed)
Left VM for patient. If she calls back please have her speak with a nurse/CMA and ask when is good to call her back. I am trying to fill out her FMLA and have some questions.   If any questions then please take the best time and phone number to call and I will try to call her back.   Rosemarie Ax, MD [facility] 05/25/2019, 10:37 AM

## 2019-05-26 ENCOUNTER — Telehealth: Payer: Self-pay | Admitting: Family Medicine

## 2019-05-26 DIAGNOSIS — M25562 Pain in left knee: Secondary | ICD-10-CM | POA: Diagnosis not present

## 2019-05-26 NOTE — Telephone Encounter (Signed)
Patient requesting letter stating she can return to work on November 23rd including any restrictions that may be needed.

## 2019-05-27 NOTE — Telephone Encounter (Signed)
Notes completed.   Rosemarie Ax, MD [facility] 05/27/2019, 10:41 AM

## 2019-05-27 NOTE — Telephone Encounter (Signed)
Patient was informed that notes have been completed and faxed as requested

## 2019-06-11 DIAGNOSIS — Z03818 Encounter for observation for suspected exposure to other biological agents ruled out: Secondary | ICD-10-CM | POA: Diagnosis not present

## 2019-09-06 DIAGNOSIS — H5203 Hypermetropia, bilateral: Secondary | ICD-10-CM | POA: Diagnosis not present

## 2019-09-06 DIAGNOSIS — D3121 Benign neoplasm of right retina: Secondary | ICD-10-CM | POA: Diagnosis not present

## 2019-09-06 DIAGNOSIS — D3122 Benign neoplasm of left retina: Secondary | ICD-10-CM | POA: Diagnosis not present

## 2019-09-08 ENCOUNTER — Encounter: Payer: Self-pay | Admitting: Cardiology

## 2019-09-08 DIAGNOSIS — R065 Mouth breathing: Secondary | ICD-10-CM | POA: Diagnosis not present

## 2019-09-08 DIAGNOSIS — R42 Dizziness and giddiness: Secondary | ICD-10-CM | POA: Diagnosis not present

## 2019-09-08 DIAGNOSIS — R635 Abnormal weight gain: Secondary | ICD-10-CM | POA: Diagnosis not present

## 2019-09-08 DIAGNOSIS — Z833 Family history of diabetes mellitus: Secondary | ICD-10-CM | POA: Diagnosis not present

## 2019-09-08 MED FILL — VALSARTAN-HCTZ 80-12.5 MG T: 80-12.5 | 90 days supply | Qty: 90 | Fill #0

## 2019-09-08 MED FILL — CITALOPRAM HBR 10 MG TABLET: 10 | 30 days supply | Qty: 55 | Fill #0

## 2019-09-14 ENCOUNTER — Telehealth: Payer: Self-pay | Admitting: *Deleted

## 2019-09-14 ENCOUNTER — Other Ambulatory Visit: Payer: Self-pay | Admitting: *Deleted

## 2019-09-14 DIAGNOSIS — R002 Palpitations: Secondary | ICD-10-CM

## 2019-09-14 DIAGNOSIS — R42 Dizziness and giddiness: Secondary | ICD-10-CM

## 2019-09-14 NOTE — Telephone Encounter (Signed)
Patient enrolled for [employer] to ship a 30 day Cardiac event monitor to her home.  Instructions sent to patient via My Chart message and will also be included in her monitor kit.

## 2019-09-15 MED FILL — POTASSIUM CHLORIDE CRYS ER: 20 | 30 days supply | Qty: 30 | Fill #0

## 2019-09-27 ENCOUNTER — Encounter: Payer: Self-pay | Admitting: Cardiology

## 2019-10-11 ENCOUNTER — Ambulatory Visit: Payer: 59 | Admitting: Internal Medicine

## 2019-10-14 ENCOUNTER — Ambulatory Visit (INDEPENDENT_AMBULATORY_CARE_PROVIDER_SITE_OTHER): Payer: 59

## 2019-10-14 DIAGNOSIS — R42 Dizziness and giddiness: Secondary | ICD-10-CM | POA: Diagnosis not present

## 2019-10-14 DIAGNOSIS — R002 Palpitations: Secondary | ICD-10-CM

## 2019-10-21 ENCOUNTER — Encounter: Payer: Self-pay | Admitting: Cardiology

## 2019-10-21 ENCOUNTER — Other Ambulatory Visit: Payer: Self-pay

## 2019-10-21 ENCOUNTER — Ambulatory Visit: Payer: 59 | Admitting: Cardiology

## 2019-10-21 VITALS — BP 126/88 | HR 92 | Ht 66.0 in | Wt 209.4 lb

## 2019-10-21 DIAGNOSIS — G4719 Other hypersomnia: Secondary | ICD-10-CM

## 2019-10-21 DIAGNOSIS — R42 Dizziness and giddiness: Secondary | ICD-10-CM

## 2019-10-21 DIAGNOSIS — R002 Palpitations: Secondary | ICD-10-CM | POA: Diagnosis not present

## 2019-10-21 NOTE — Addendum Note (Signed)
Addended by: Antonieta Iba on: 10/21/2019 03:27 PM   Modules accepted: Orders

## 2019-10-21 NOTE — Progress Notes (Addendum)
Cardiology Consult Note    Date:  10/21/2019   ID:  CHAO HOWDESHELL, DOB 06-05-1971, MRN [id number]  PCP:  Lucianne Lei, MD  Cardiologist:  Fransico Him, MD   Chief Complaint  Patient presents with  . New Patient (Initial Visit)    palpitations    History of Present Illness:  Jamie Watson is a 49 y.o. female who is being seen today for the evaluation of Palpitations at the request of Lucianne Lei, MD.  This is a 49yo AAF with a hx of chronic HAs, fatigue and seasonal allergies.  She recently has been complaining of dizziness and is now referred by her PCP. She tells me that she had an episode where she thinks she was moving too fast and tripped and caught herself but then tripped again and fell and fractured her tibia. She denies having any lightheadeness or syncope and says that she only felt a little woozy and off balance.  She has had issues recently with frequent stumbling but no lightheadedness or syncope.    She also has been having problems with palpitations for the past few years which really are about the same.  They occur 1-2 times monthly and only last a minutes and are not associated with any other symptoms.  She denies any chest pain but dose have some mild dyspnea at times but she says that usually occurs with her panic attacks and if she can talk herself down from a panic attack she the SOB resolves.  She denies any LE edema except from when she broker her leg.  She denies any PND, orthopnea.  She used to smoke but quit 7-8 yrs ago.  There is no family hx of CAD or SCD.   Her PCP documented in her OV notes that there was possible QT prolongation on her EKG but the tracing sent is poor and EKG done today showed NSR with normal QTc at 435msec and PVCs.      Past Medical History:  Diagnosis Date  . Acute pain of left knee 04/19/2019  . Chronic headaches   . Fatigue   . Irregular heart beat   . Migraine   . Right arm pain 04/19/2019  . Seasonal allergies   . Snoring     . Weight gain     Past Surgical History:  Procedure Laterality Date  . TONSILLECTOMY      Current Medications: Current Meds  Medication Sig  . valsartan-hydrochlorothiazide (DIOVAN-HCT) 80-12.5 MG tablet Take 1 tablet by mouth daily.    Allergies:   Patient has no known allergies.   Social History   Socioeconomic History  . Marital status: Legally Separated    Spouse name: Not on file  . Number of children: Not on file  . Years of education: Not on file  . Highest education level: Not on file  Occupational History  . Not on file  Tobacco Use  . Smoking status: Never Smoker  . Smokeless tobacco: Never Used  Substance and Sexual Activity  . Alcohol use: Yes  . Drug use: No  . Sexual activity: Not on file  Other Topics Concern  . Not on file  Social History Narrative  . Not on file   Social Determinants of Health   Financial Resource Strain:   . Difficulty of Paying Living Expenses:   Food Insecurity:   . Worried About Charity fundraiser in the Last Year:   . [place] in the Last Year:  Transportation Needs:   . Film/video editor (Medical):   Marland Kitchen Lack of Transportation (Non-Medical):   Physical Activity:   . Days of Exercise per Week:   . Minutes of Exercise per Session:   Stress:   . Feeling of Stress :   Social Connections:   . Frequency of Communication with Friends and Family:   . Frequency of Social Gatherings with Friends and Family:   . Attends Religious Services:   . Active Member of Clubs or Organizations:   . Attends Archivist Meetings:   Marland Kitchen Marital Status:      Family History:  The patient's family history is not on file.   ROS:   Please see the history of present illness.    ROS All other systems reviewed and are negative.  No flowsheet data found.     PHYSICAL EXAM:   VS:  BP 126/88   Pulse 92   Ht 5\' 6"  (1.676 m)   Wt 209 lb 6.4 oz (95 kg)   BMI 33.80 kg/m    GEN: Well nourished, well developed, in no  acute distress  HEENT: normal  Neck: no JVD, carotid bruits, or masses Cardiac: RRR; no murmurs, rubs, or gallops,no edema.  Intact distal pulses bilaterally.  Respiratory:  clear to auscultation bilaterally, normal work of breathing GI: soft, nontender, nondistended, + BS MS: no deformity or atrophy  Skin: warm and dry, no rash Neuro:  Alert and Oriented x 3, Strength and sensation are intact Psych: euthymic mood, full affect  Wt Readings from Last 3 Encounters:  10/21/19 209 lb 6.4 oz (95 kg)  04/19/19 205 lb (93 kg)  04/12/19 205 lb (93 kg)      Studies/Labs Reviewed:   EKG:  EKG is not ordered today.   Recent Labs: No results found for requested labs within last 8760 hours.   Lipid Panel No results found for: CHOL, TRIG, HDL, CHOLHDL, VLDL, LDLCALC, LDLDIRECT  Additional studies/ records that were reviewed today include:  OV notes from PCP    ASSESSMENT:    1. Dizziness   2. Palpitations   3. Excessive daytime sleepiness      PLAN:  In order of problems listed above:  1. Dizziness -the patient does not really describe dizziness and says she does not feel lightheaded or like she is going to pass out - it is more of a woozy and clumsy feeling -she is wearing a heart monitor to rule out arrhythmia -Neuro exam by PCP was abnormal and she has been referred to Neuro -check 2D echo  2.  Palpitations -unclear etiology but suspect PVCs which were noted on her EKG today -PCP reported prolonged QTc on EKG done in her office but EKG today is normal -she is wearing an event monitor -I will try to get another better quality copy of the EKG from PCP  3. Excessive daytime fatigue -sleep study is pending    Medication Adjustments/Labs and Tests Ordered: Current medicines are reviewed at length with the patient today.  Concerns regarding medicines are outlined above.  Medication changes, Labs and Tests ordered today are listed in the Patient Instructions  below.  There are no Patient Instructions on file for this visit.   Signed, Fransico Him, MD  10/21/2019 3:16 PM    [facility] [address], [place], [place]  [address] Phone: [phone]; Fax: [phone]

## 2019-10-21 NOTE — Patient Instructions (Signed)
Medication Instructions:  Your physician recommends that you continue on your current medications as directed. Please refer to the Current Medication list given to you today.  *If you need a refill on your cardiac medications before your next appointment, please call your pharmacy*  Testing/Procedures: Your physician has requested that you have an echocardiogram. Echocardiography is a painless test that uses sound waves to create images of your heart. It provides your doctor with information about the size and shape of your heart and how well your heart's chambers and valves are working. This procedure takes approximately one hour. There are no restrictions for this procedure.    Follow-Up: At [facility], you and your health needs are our priority.  As part of our continuing mission to provide you with exceptional heart care, we have created designated Provider Care Teams.  These Care Teams include your primary Cardiologist (physician) and Advanced Practice Providers (APPs -  Physician Assistants and Nurse Practitioners) who all work together to provide you with the care you need, when you need it.  We recommend signing up for the patient portal called "MyChart".  Sign up information is provided on this After Visit Summary.  MyChart is used to connect with patients for Virtual Visits (Telemedicine).  Patients are able to view lab/test results, encounter notes, upcoming appointments, etc.  Non-urgent messages can be sent to your provider as well.   To learn more about what you can do with MyChart, go to [email or web address].    Follow up as needed based on the results of testing.

## 2019-10-26 ENCOUNTER — Telehealth: Payer: Self-pay

## 2019-10-26 NOTE — Telephone Encounter (Signed)
Attempted to call pt's PCP. No answer.

## 2019-10-26 NOTE — Telephone Encounter (Signed)
-----   Message from Sueanne Margarita, MD sent at 10/21/2019  7:12 PM EDT ----- Please see if you can get a better quality copy of the EKG done at her PCP office  Traci

## 2019-11-18 ENCOUNTER — Other Ambulatory Visit (HOSPITAL_COMMUNITY): Payer: 59

## 2019-12-08 ENCOUNTER — Other Ambulatory Visit (HOSPITAL_COMMUNITY): Payer: 59

## 2019-12-15 ENCOUNTER — Telehealth: Payer: Self-pay

## 2019-12-15 NOTE — Telephone Encounter (Signed)
Left message for patient to call back and reschedule her echocardiogram.

## 2019-12-16 ENCOUNTER — Encounter (HOSPITAL_COMMUNITY): Payer: Self-pay | Admitting: Internal Medicine

## 2019-12-27 ENCOUNTER — Telehealth (HOSPITAL_COMMUNITY): Payer: Self-pay | Admitting: Internal Medicine

## 2019-12-27 NOTE — Telephone Encounter (Signed)
Just an FYI. We have made several attempts to contact this patient including sending a letter to schedule or reschedule their echocardiogram. We will be removing the patient from the echo WQ.  Mailed letter  12/16/19 [facility] to reschedule NS Echo @ 10:35/[facility]W  12/13/2019 Called pt and LVM to call office @ 10am/LBW  12/08/19 Pt No Showed echo      Thank you

## 2019-12-31 NOTE — Telephone Encounter (Signed)
Spoke with the patient and she does not wish to proceed with echocardiogram at this time.

## 2020-01-20 DIAGNOSIS — R635 Abnormal weight gain: Secondary | ICD-10-CM | POA: Diagnosis not present

## 2020-01-20 DIAGNOSIS — F411 Generalized anxiety disorder: Secondary | ICD-10-CM | POA: Diagnosis not present

## 2020-01-20 DIAGNOSIS — E782 Mixed hyperlipidemia: Secondary | ICD-10-CM | POA: Diagnosis not present

## 2020-01-20 DIAGNOSIS — I10 Essential (primary) hypertension: Secondary | ICD-10-CM | POA: Diagnosis not present

## 2020-01-20 DIAGNOSIS — Z833 Family history of diabetes mellitus: Secondary | ICD-10-CM | POA: Diagnosis not present

## 2020-01-20 DIAGNOSIS — Z72821 Inadequate sleep hygiene: Secondary | ICD-10-CM | POA: Diagnosis not present

## 2020-01-20 MED FILL — CITALOPRAM HBR 10 MG TABLET: 10 | 30 days supply | Qty: 55 | Fill #0

## 2020-01-20 MED FILL — VALSARTAN-HCTZ 80-12.5 MG T: 80-12.5 | 90 days supply | Qty: 90 | Fill #0

## 2020-02-03 DIAGNOSIS — E782 Mixed hyperlipidemia: Secondary | ICD-10-CM | POA: Diagnosis not present

## 2020-02-03 DIAGNOSIS — F411 Generalized anxiety disorder: Secondary | ICD-10-CM | POA: Diagnosis not present

## 2020-02-03 DIAGNOSIS — I10 Essential (primary) hypertension: Secondary | ICD-10-CM | POA: Diagnosis not present

## 2020-02-03 DIAGNOSIS — E669 Obesity, unspecified: Secondary | ICD-10-CM | POA: Diagnosis not present

## 2020-02-03 MED FILL — CITALOPRAM HBR 20 MG TABLET: 20 | 90 days supply | Qty: 90 | Fill #0

## 2020-02-03 MED FILL — AMLODIPINE BESYLATE 5 MG TA: 5 | 30 days supply | Qty: 30 | Fill #0

## 2020-02-18 DIAGNOSIS — Z1231 Encounter for screening mammogram for malignant neoplasm of breast: Secondary | ICD-10-CM | POA: Diagnosis not present

## 2020-06-05 ENCOUNTER — Other Ambulatory Visit (HOSPITAL_COMMUNITY): Payer: Self-pay | Admitting: Nurse Practitioner

## 2020-06-05 DIAGNOSIS — I10 Essential (primary) hypertension: Secondary | ICD-10-CM | POA: Diagnosis not present

## 2020-06-05 DIAGNOSIS — E782 Mixed hyperlipidemia: Secondary | ICD-10-CM | POA: Diagnosis not present

## 2020-06-05 DIAGNOSIS — R635 Abnormal weight gain: Secondary | ICD-10-CM | POA: Diagnosis not present

## 2020-06-05 DIAGNOSIS — E669 Obesity, unspecified: Secondary | ICD-10-CM | POA: Diagnosis not present

## 2020-06-05 DIAGNOSIS — Z7189 Other specified counseling: Secondary | ICD-10-CM | POA: Diagnosis not present

## 2020-06-05 DIAGNOSIS — F411 Generalized anxiety disorder: Secondary | ICD-10-CM | POA: Diagnosis not present

## 2020-06-05 MED FILL — VALSARTAN-HCTZ 80-12.5 MG T: 80-12.5 | 90 days supply | Qty: 90 | Fill #0

## 2020-06-05 MED FILL — AMLODIPINE 2.5 MG TABLET: 2.5 | 90 days supply | Qty: 90 | Fill #0

## 2020-09-18 DIAGNOSIS — M25572 Pain in left ankle and joints of left foot: Secondary | ICD-10-CM | POA: Diagnosis not present

## 2020-09-18 DIAGNOSIS — M25571 Pain in right ankle and joints of right foot: Secondary | ICD-10-CM | POA: Diagnosis not present

## 2020-09-18 DIAGNOSIS — M79671 Pain in right foot: Secondary | ICD-10-CM | POA: Diagnosis not present

## 2020-09-18 DIAGNOSIS — M65872 Other synovitis and tenosynovitis, left ankle and foot: Secondary | ICD-10-CM | POA: Diagnosis not present

## 2020-09-18 DIAGNOSIS — M65871 Other synovitis and tenosynovitis, right ankle and foot: Secondary | ICD-10-CM | POA: Diagnosis not present

## 2020-09-18 DIAGNOSIS — M21611 Bunion of right foot: Secondary | ICD-10-CM | POA: Diagnosis not present

## 2020-09-18 DIAGNOSIS — M79672 Pain in left foot: Secondary | ICD-10-CM | POA: Diagnosis not present

## 2020-09-18 DIAGNOSIS — M21612 Bunion of left foot: Secondary | ICD-10-CM | POA: Diagnosis not present

## 2020-09-19 ENCOUNTER — Other Ambulatory Visit (HOSPITAL_COMMUNITY): Payer: Self-pay | Admitting: Podiatry

## 2020-10-13 ENCOUNTER — Other Ambulatory Visit (HOSPITAL_BASED_OUTPATIENT_CLINIC_OR_DEPARTMENT_OTHER): Payer: Self-pay

## 2020-10-13 ENCOUNTER — Emergency Department (HOSPITAL_BASED_OUTPATIENT_CLINIC_OR_DEPARTMENT_OTHER)
Admission: EM | Admit: 2020-10-13 | Discharge: 2020-10-13 | Disposition: A | Discharge status: Home / Self Care | Payer: 59 | Attending: Emergency Medicine | Admitting: Emergency Medicine

## 2020-10-13 ENCOUNTER — Other Ambulatory Visit (HOSPITAL_COMMUNITY): Payer: Self-pay

## 2020-10-13 ENCOUNTER — Other Ambulatory Visit: Payer: Self-pay

## 2020-10-13 ENCOUNTER — Encounter (HOSPITAL_BASED_OUTPATIENT_CLINIC_OR_DEPARTMENT_OTHER): Payer: Self-pay | Admitting: Emergency Medicine

## 2020-10-13 DIAGNOSIS — I10 Essential (primary) hypertension: Secondary | ICD-10-CM | POA: Insufficient documentation

## 2020-10-13 DIAGNOSIS — J3489 Other specified disorders of nose and nasal sinuses: Secondary | ICD-10-CM | POA: Insufficient documentation

## 2020-10-13 DIAGNOSIS — Z20822 Contact with and (suspected) exposure to covid-19: Secondary | ICD-10-CM | POA: Diagnosis not present

## 2020-10-13 DIAGNOSIS — R059 Cough, unspecified: Secondary | ICD-10-CM | POA: Diagnosis not present

## 2020-10-13 DIAGNOSIS — Q752 Hypertelorism: Secondary | ICD-10-CM

## 2020-10-13 DIAGNOSIS — R519 Headache, unspecified: Secondary | ICD-10-CM | POA: Diagnosis present

## 2020-10-13 LAB — SARS CORONAVIRUS 2 (TAT 6-24 HRS): SARS Coronavirus 2: NEGATIVE

## 2020-10-13 MED ORDER — IBUPROFEN 400 MG PO TABS
400.0000 mg | ORAL_TABLET | Freq: Four times a day (QID) | ORAL | 0 refills | Status: DC | PRN
  Filled 2020-10-13 (×2): qty 12, 3d supply, fill #0

## 2020-10-13 MED ORDER — IBUPROFEN 800 MG PO TABS
800.0000 mg | ORAL_TABLET | Freq: Once | ORAL | Status: AC
  Administered 2020-10-13: 800 mg via ORAL
  Filled 2020-10-13: qty 1

## 2020-10-13 MED ORDER — ACETAMINOPHEN 500 MG PO TABS
1000.0000 mg | ORAL_TABLET | Freq: Once | ORAL | Status: AC
  Administered 2020-10-13: 1000 mg via ORAL
  Filled 2020-10-13: qty 2

## 2020-10-13 MED ORDER — HYDROCHLOROTHIAZIDE 25 MG PO TABS
25.0000 mg | ORAL_TABLET | Freq: Once | ORAL | Status: AC
  Administered 2020-10-13: 25 mg via ORAL
  Filled 2020-10-13: qty 1

## 2020-10-13 MED ORDER — PREDNISONE 20 MG PO TABS
ORAL_TABLET | ORAL | 0 refills | Status: DC
Start: 2020-10-13 — End: 2022-01-28
  Filled 2020-10-13: qty 6, 3d supply, fill #0

## 2020-10-13 MED ORDER — BENZONATATE 100 MG PO CAPS
100.0000 mg | ORAL_CAPSULE | Freq: Three times a day (TID) | ORAL | 0 refills | Status: DC
Start: 2020-10-13 — End: 2022-01-28
  Filled 2020-10-13 (×2): qty 21, 7d supply, fill #0

## 2020-10-13 NOTE — ED Provider Notes (Signed)
[facility] DEPART[facility] Provider Note   CSN: [id number] Arrival date & time: 10/13/20  4098     History Chief Complaint  Patient presents with  . Facial Pain  . Nasal Congestion    Jamie Watson is a 50 y.o. female.  The history is provided by the patient.  URI Presenting symptoms: congestion, cough, facial pain and rhinorrhea   Presenting symptoms: no fatigue and no fever   Presenting symptoms comment:  Sneezing Severity:  Moderate Onset quality:  Gradual Duration:  2 days Timing:  Constant Progression:  Worsening Chronicity:  New Relieved by:  Nothing Worsened by:  Nothing Ineffective treatments:  None tried Associated symptoms: sneezing   Associated symptoms: no arthralgias   Risk factors: not elderly, no chronic cardiac disease, no chronic kidney disease, no chronic respiratory disease, no diabetes mellitus, no immunosuppression, no recent illness, no recent travel and no sick contacts   Patient with HTN and seasonal allergies presents with URI symptoms for 2 days.  No f/c/r.  No HA.  Patient reports she takes zyrtec for allergies but cannot take nasal sprays.      Past Medical History:  Diagnosis Date  . Acute pain of left knee 04/19/2019  . Chronic headaches   . Fatigue   . Irregular heart beat   . Migraine   . Right arm pain 04/19/2019  . Seasonal allergies   . Snoring   . Weight gain     Patient Active Problem List   Diagnosis Date Noted  . Acute pain of left knee 04/19/2019  . Right arm pain 04/19/2019    Past Surgical History:  Procedure Laterality Date  . TONSILLECTOMY       OB History   No obstetric history on file.     History reviewed. No pertinent family history.  Social History   Tobacco Use  . Smoking status: Never Smoker  . Smokeless tobacco: Never Used  Vaping Use  . Vaping Use: Never used  Substance Use Topics  . Alcohol use: Yes  . Drug use: No    Home Medications Prior to Admission medications    Medication Sig Start Date End Date Taking? Authorizing Provider  benzonatate (TESSALON) 100 MG capsule Take 1 capsule (100 mg total) by mouth every 8 (eight) hours. 10/13/20  Yes Kadejah Sandiford, MD  ibuprofen (ADVIL) 400 MG tablet Take 1 tablet (400 mg total) by mouth every 6 (six) hours as needed. 10/13/20  Yes Kimbra Marcelino, MD  predniSONE (DELTASONE) 20 MG tablet 2 tabs po daily x 3 days 10/13/20  Yes Monty Mccarrell, MD  valsartan-hydrochlorothiazide (DIOVAN-HCT) 80-12.5 MG tablet Take 1 tablet by mouth daily. 09/08/19   [provider]    Allergies    Patient has no known allergies.  Review of Systems   Review of Systems  Constitutional: Negative for fatigue and fever.  HENT: Positive for congestion, rhinorrhea and sneezing.   Eyes: Negative for visual disturbance.  Respiratory: Positive for cough. Negative for shortness of breath.   Cardiovascular: Negative for chest pain.  Gastrointestinal: Negative for abdominal pain.  Genitourinary: Negative for difficulty urinating.  Musculoskeletal: Negative for arthralgias.  Neurological: Negative for facial asymmetry.  Psychiatric/Behavioral: Negative for agitation.  All other systems reviewed and are negative.   Physical Exam Updated Vital Signs BP (!) 164/115 (BP Location: Right Arm)   Pulse 90   Temp 98.2 F (36.8 C) (Oral)   Ht 5\' 6"  (1.676 m)   Wt 96.2 kg   SpO2  100%   BMI 34.22 kg/m   Physical Exam Vitals and nursing note reviewed.  Constitutional:      General: She is not in acute distress.    Appearance: Normal appearance. She is not toxic-appearing or diaphoretic.  HENT:     Head: Normocephalic and atraumatic.     Nose: Nose normal.  Eyes:     Extraocular Movements: Extraocular movements intact.     Conjunctiva/sclera: Conjunctivae normal.     Pupils: Pupils are equal, round, and reactive to light.  Cardiovascular:     Rate and Rhythm: Normal rate and regular rhythm.     Pulses: Normal pulses.     Heart  sounds: Normal heart sounds.  Pulmonary:     Effort: Pulmonary effort is normal.     Breath sounds: Normal breath sounds.  Abdominal:     General: Abdomen is flat. Bowel sounds are normal.     Palpations: Abdomen is soft.     Tenderness: There is no abdominal tenderness. There is no guarding.  Musculoskeletal:        General: Normal range of motion.     Cervical back: Normal range of motion and neck supple.  Skin:    General: Skin is warm and dry.     Capillary Refill: Capillary refill takes less than 2 seconds.  Neurological:     General: No focal deficit present.     Mental Status: She is alert and oriented to person, place, and time.     Deep Tendon Reflexes: Reflexes normal.  Psychiatric:        Mood and Affect: Mood normal.        Behavior: Behavior normal.     ED Results / Procedures / Treatments   Labs (all labs ordered are listed, but only abnormal results are displayed) Labs Reviewed  SARS CORONAVIRUS 2 (TAT 6-24 HRS)    EKG None  Radiology No results found.  Procedures Procedures   Medications Ordered in ED Medications  acetaminophen (TYLENOL) tablet 1,000 mg (has no administration in time range)  ibuprofen (ADVIL) tablet 800 mg (has no administration in time range)  hydrochlorothiazide (HYDRODIURIL) tablet 25 mg (has no administration in time range)    ED Course  I have reviewed the triage vital signs and the nursing notes.  Pertinent labs & imaging results that were available during my care of the patient were reviewed by me and considered in my medical decision making (see chart for details).   This is not a bacterial sinus infection and does not require antibiotics.  There is no fever, no nasal drainage and symptoms have only been going on for 2 days.  I am concerned for covid given the cough and nasal symptoms.  I have sent symptomatic relief to the patient's pharmacy, you may also take tylenol for pain as well.  COVID swab obtained.  Please check  mychart in 24 hours for results.  Please quarantine strictly pending results and for 7 days if positive.  Patient is given BP medication in the ED as she has not had her morning medication.    Jamie Watson was evaluated in Emergency Department on 10/13/2020 for the symptoms described in the history of present illness. She was evaluated in the context of the global COVID-19 pandemic, which necessitated consideration that the patient might be at risk for infection with the SARS-CoV-2 virus that causes COVID-19. Institutional protocols and algorithms that pertain to the evaluation of patients at risk for COVID-19 are in a  state of rapid change based on information released by regulatory bodies including the CDC and federal and state organizations. These policies and algorithms were followed during the patient's care in the ED.  Final Clinical Impression(s) / ED Diagnoses Final diagnoses:  Person under investigation for COVID-19  Hypertelorism   Return for intractable cough, coughing up blood, fevers >100.4 unrelieved by medication, shortness of breath, intractable vomiting, chest pain, shortness of breath, weakness, numbness, changes in speech, facial asymmetry, abdominal pain, passing out, Inability to tolerate liquids or food, cough, altered mental status or any concerns. No signs of systemic illness or infection. The patient is nontoxic-appearing on exam and vital signs are within normal limits.  I have reviewed the triage vital signs and the nursing notes. Pertinent labs & imaging results that were available during my care of the patient were reviewed by me and considered in my medical decision making (see chart for details). After history, exam, and medical workup I feel the patient has been appropriately medically screened and is safe for discharge home. Pertinent diagnoses were discussed with the patient. Patient was given return precautions. Rx / DC Orders ED Discharge Orders         Ordered     benzonatate (TESSALON) 100 MG capsule  Every 8 hours        10/13/20 0633    predniSONE (DELTASONE) 20 MG tablet        10/13/20 0633    ibuprofen (ADVIL) 400 MG tablet  Every 6 hours PRN        10/13/20 0633           Cloteal Isaacson, MD 10/13/20 [id number]

## 2020-10-13 NOTE — ED Triage Notes (Signed)
Pt via POV. Patient reports coughing, facial pain, congestion and sinus problems x2 days. A&Ox4. Respirations regular/unlabored. Patient able to speak full sentences. MD at bedside.

## 2020-10-13 NOTE — Discharge Instructions (Signed)
Person Under Monitoring Name: Jamie Watson  Location: [address] [place] [address]   Infection Prevention Recommendations for Individuals Confirmed to have, or Being Evaluated for, 2019 Novel Coronavirus (COVID-19) Infection Who Receive Care at Home  Individuals who are confirmed to have, or are being evaluated for, COVID-19 should follow the prevention steps below until a healthcare provider or local or state health department says they can return to normal activities.  Stay home except to get medical care You should restrict activities outside your home, except for getting medical care. Do not go to work, school, or public areas, and do not use public transportation or taxis.  Call ahead before visiting your doctor Before your medical appointment, call the healthcare provider and tell them that you have, or are being evaluated for, COVID-19 infection. This will help the healthcare provider's office take steps to keep other people from getting infected. Ask your healthcare provider to call the local or state health department.  Monitor your symptoms Seek prompt medical attention if your illness is worsening (e.g., difficulty breathing). Before going to your medical appointment, call the healthcare provider and tell them that you have, or are being evaluated for, COVID-19 infection. Ask your healthcare provider to call the local or state health department.  Wear a facemask You should wear a facemask that covers your nose and mouth when you are in the same room with other people and when you visit a healthcare provider. People who live with or visit you should also wear a facemask while they are in the same room with you.  Separate yourself from other people in your home As much as possible, you should stay in a different room from other people in your home. Also, you should use a separate bathroom, if available.  Avoid sharing household items You  should not share dishes, drinking glasses, cups, eating utensils, towels, bedding, or other items with other people in your home. After using these items, you should wash them thoroughly with soap and water.  Cover your coughs and sneezes Cover your mouth and nose with a tissue when you cough or sneeze, or you can cough or sneeze into your sleeve. Throw used tissues in a lined trash can, and immediately wash your hands with soap and water for at least 20 seconds or use an alcohol-based hand rub.  Wash your [facility] your hands often and thoroughly with soap and water for at least 20 seconds. You can use an alcohol-based hand sanitizer if soap and water are not available and if your hands are not visibly dirty. Avoid touching your eyes, nose, and mouth with unwashed hands.   Prevention Steps for Caregivers and Household Members of Individuals Confirmed to have, or Being Evaluated for, COVID-19 Infection Being Cared for in the Home  If you live with, or provide care at home for, a person confirmed to have, or being evaluated for, COVID-19 infection please follow these guidelines to prevent infection:  Follow healthcare provider's instructions Make sure that you understand and can help the patient follow any healthcare provider instructions for all care.  Provide for the patient's basic needs You should help the patient with basic needs in the home and provide support for getting groceries, prescriptions, and other personal needs.  Monitor the patient's symptoms If they are getting sicker, call his or her medical provider and tell them that the patient has, or is being evaluated for, COVID-19 infection. This will help the  healthcare provider's office take steps to keep other people from getting infected. Ask the healthcare provider to call the local or state health department.  Limit the number of people who have contact with the patient If possible, have only one caregiver for the  patient. Other household members should stay in another home or place of residence. If this is not possible, they should stay in another room, or be separated from the patient as much as possible. Use a separate bathroom, if available. Restrict visitors who do not have an essential need to be in the home.  Keep older adults, very young children, and other sick people away from the patient Keep older adults, very young children, and those who have compromised immune systems or chronic health conditions away from the patient. This includes people with chronic heart, lung, or kidney conditions, diabetes, and cancer.  Ensure good ventilation Make sure that shared spaces in the home have good air flow, such as from an air conditioner or an opened window, weather permitting.  Wash your hands often Wash your hands often and thoroughly with soap and water for at least 20 seconds. You can use an alcohol based hand sanitizer if soap and water are not available and if your hands are not visibly dirty. Avoid touching your eyes, nose, and mouth with unwashed hands. Use disposable paper towels to dry your hands. If not available, use dedicated cloth towels and replace them when they become wet.  Wear a facemask and gloves Wear a disposable facemask at all times in the room and gloves when you touch or have contact with the patient's blood, body fluids, and/or secretions or excretions, such as sweat, saliva, sputum, nasal mucus, vomit, urine, or feces.  Ensure the mask fits over your nose and mouth tightly, and do not touch it during use. Throw out disposable facemasks and gloves after using them. Do not reuse. Wash your hands immediately after removing your facemask and gloves. If your personal clothing becomes contaminated, carefully remove clothing and launder. Wash your hands after handling contaminated clothing. Place all used disposable facemasks, gloves, and other waste in a lined container before  disposing them with other household waste. Remove gloves and wash your hands immediately after handling these items.  Do not share dishes, glasses, or other household items with the patient Avoid sharing household items. You should not share dishes, drinking glasses, cups, eating utensils, towels, bedding, or other items with a patient who is confirmed to have, or being evaluated for, COVID-19 infection. After the person uses these items, you should wash them thoroughly with soap and water.  Wash laundry thoroughly Immediately remove and wash clothes or bedding that have blood, body fluids, and/or secretions or excretions, such as sweat, saliva, sputum, nasal mucus, vomit, urine, or feces, on them. Wear gloves when handling laundry from the patient. Read and follow directions on labels of laundry or clothing items and detergent. In general, wash and dry with the warmest temperatures recommended on the label.  Clean all areas the individual has used often Clean all touchable surfaces, such as counters, tabletops, doorknobs, bathroom fixtures, toilets, phones, keyboards, tablets, and bedside tables, every day. Also, clean any surfaces that may have blood, body fluids, and/or secretions or excretions on them. Wear gloves when cleaning surfaces the patient has come in contact with. Use a diluted bleach solution (e.g., dilute bleach with 1 part bleach and 10 parts water) or a household disinfectant with a label that says EPA-registered for coronaviruses. To  make a bleach solution at home, add 1 tablespoon of bleach to 1 quart (4 cups) of water. For a larger supply, add  cup of bleach to 1 gallon (16 cups) of water. Read labels of cleaning products and follow recommendations provided on product labels. Labels contain instructions for safe and effective use of the cleaning product including precautions you should take when applying the product, such as wearing gloves or eye protection and making sure you  have good ventilation during use of the product. Remove gloves and wash hands immediately after cleaning.  Monitor yourself for signs and symptoms of illness Caregivers and household members are considered close contacts, should monitor their health, and will be asked to limit movement outside of the home to the extent possible. Follow the monitoring steps for close contacts listed on the symptom monitoring form.   ? If you have additional questions, contact your local health department or call the epidemiologist on call at [phone] (available 24/7). ? This guidance is subject to change. For the most up-to-date guidance from [facility], please refer to their website: YouBlogs.pl

## 2020-11-11 ENCOUNTER — Other Ambulatory Visit (HOSPITAL_COMMUNITY): Payer: Self-pay

## 2020-11-11 DIAGNOSIS — E782 Mixed hyperlipidemia: Secondary | ICD-10-CM | POA: Diagnosis not present

## 2020-11-11 DIAGNOSIS — Z72821 Inadequate sleep hygiene: Secondary | ICD-10-CM | POA: Diagnosis not present

## 2020-11-11 DIAGNOSIS — R7303 Prediabetes: Secondary | ICD-10-CM | POA: Diagnosis not present

## 2020-11-11 DIAGNOSIS — F411 Generalized anxiety disorder: Secondary | ICD-10-CM | POA: Diagnosis not present

## 2020-11-11 DIAGNOSIS — I10 Essential (primary) hypertension: Secondary | ICD-10-CM | POA: Diagnosis not present

## 2020-11-11 DIAGNOSIS — E669 Obesity, unspecified: Secondary | ICD-10-CM | POA: Diagnosis not present

## 2020-11-11 DIAGNOSIS — Z7189 Other specified counseling: Secondary | ICD-10-CM | POA: Diagnosis not present

## 2020-11-11 MED ORDER — VALSARTAN-HYDROCHLOROTHIAZIDE 160-12.5 MG PO TABS
1.0000 | ORAL_TABLET | Freq: Every day | ORAL | 0 refills | Status: DC
  Filled 2020-11-11: qty 90, 90d supply, fill #0

## 2020-11-11 MED ORDER — METOPROLOL SUCCINATE ER 25 MG PO TB24
25.0000 mg | ORAL_TABLET | Freq: Every morning | ORAL | 0 refills | Status: DC
  Filled 2020-11-11: qty 30, 30d supply, fill #0

## 2020-11-11 MED ORDER — AMLODIPINE BESYLATE 5 MG PO TABS
5.0000 mg | ORAL_TABLET | Freq: Every day | ORAL | 0 refills | Status: DC
  Filled 2020-11-11: qty 90, 90d supply, fill #0

## 2020-11-13 ENCOUNTER — Other Ambulatory Visit (HOSPITAL_COMMUNITY): Payer: Self-pay

## 2020-11-29 ENCOUNTER — Other Ambulatory Visit (HOSPITAL_COMMUNITY): Payer: Self-pay

## 2020-11-29 DIAGNOSIS — Z01419 Encounter for gynecological examination (general) (routine) without abnormal findings: Secondary | ICD-10-CM | POA: Diagnosis not present

## 2020-11-29 DIAGNOSIS — Z780 Asymptomatic menopausal state: Secondary | ICD-10-CM | POA: Diagnosis not present

## 2020-11-29 DIAGNOSIS — N952 Postmenopausal atrophic vaginitis: Secondary | ICD-10-CM | POA: Diagnosis not present

## 2020-11-29 DIAGNOSIS — N898 Other specified noninflammatory disorders of vagina: Secondary | ICD-10-CM | POA: Diagnosis not present

## 2020-11-29 MED ORDER — ESTRADIOL 10 MCG VA TABS
1.0000 | ORAL_TABLET | VAGINAL | 11 refills | Status: DC
  Filled 2020-11-29: qty 30, 74d supply, fill #0

## 2020-12-06 ENCOUNTER — Other Ambulatory Visit (HOSPITAL_COMMUNITY): Payer: Self-pay

## 2020-12-06 DIAGNOSIS — R7303 Prediabetes: Secondary | ICD-10-CM | POA: Diagnosis not present

## 2020-12-06 DIAGNOSIS — I10 Essential (primary) hypertension: Secondary | ICD-10-CM | POA: Diagnosis not present

## 2020-12-06 DIAGNOSIS — Z7189 Other specified counseling: Secondary | ICD-10-CM | POA: Diagnosis not present

## 2020-12-06 DIAGNOSIS — F411 Generalized anxiety disorder: Secondary | ICD-10-CM | POA: Diagnosis not present

## 2020-12-06 DIAGNOSIS — R065 Mouth breathing: Secondary | ICD-10-CM | POA: Diagnosis not present

## 2020-12-06 DIAGNOSIS — Z72821 Inadequate sleep hygiene: Secondary | ICD-10-CM | POA: Diagnosis not present

## 2020-12-06 DIAGNOSIS — E782 Mixed hyperlipidemia: Secondary | ICD-10-CM | POA: Diagnosis not present

## 2020-12-06 DIAGNOSIS — E669 Obesity, unspecified: Secondary | ICD-10-CM | POA: Diagnosis not present

## 2020-12-08 ENCOUNTER — Other Ambulatory Visit (HOSPITAL_COMMUNITY): Payer: Self-pay

## 2020-12-15 IMAGING — CR DG KNEE COMPLETE 4+V*L*
4 series · 4 of 4 positions shown · non-contrast
Comparison: None.

CLINICAL DATA: Patient states that she fell 2 weeks ago, having
left anterior knee pains with abrasion, no other complaints

EXAM:
LEFT KNEE - COMPLETE 4+ VIEW

[t knee ap left]
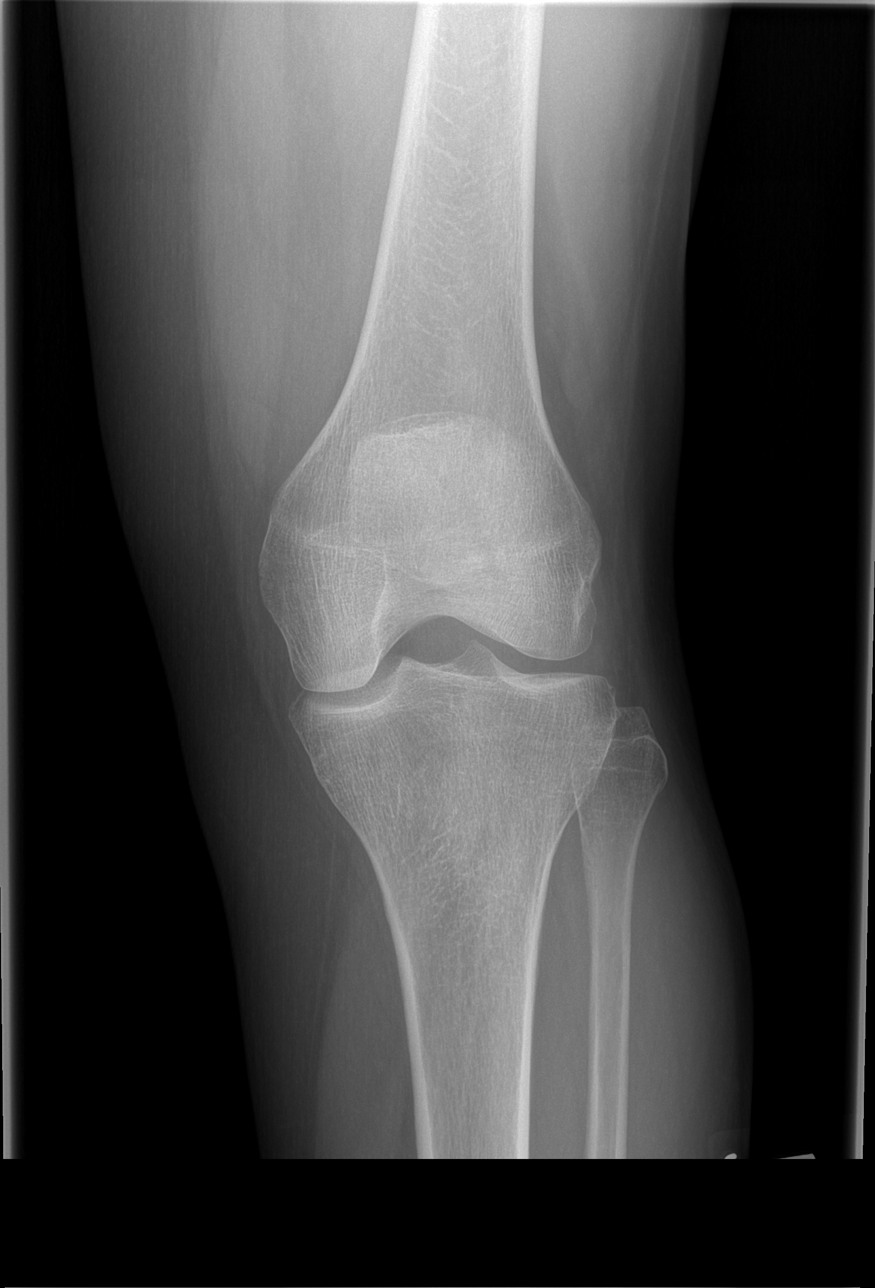

[t knee oblique left (1 of 2)]
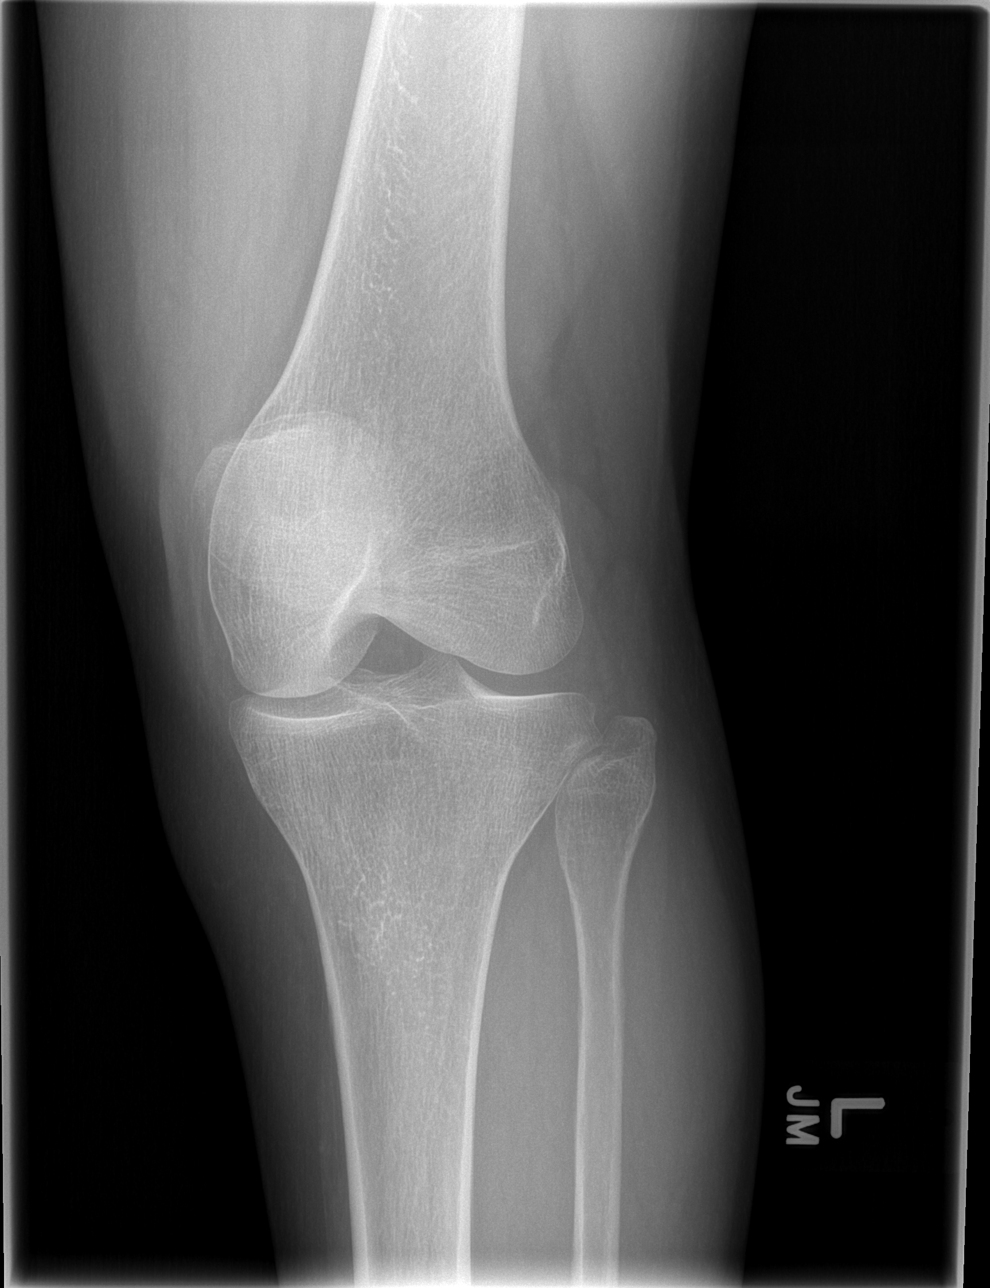

[t knee oblique left (2 of 2)]
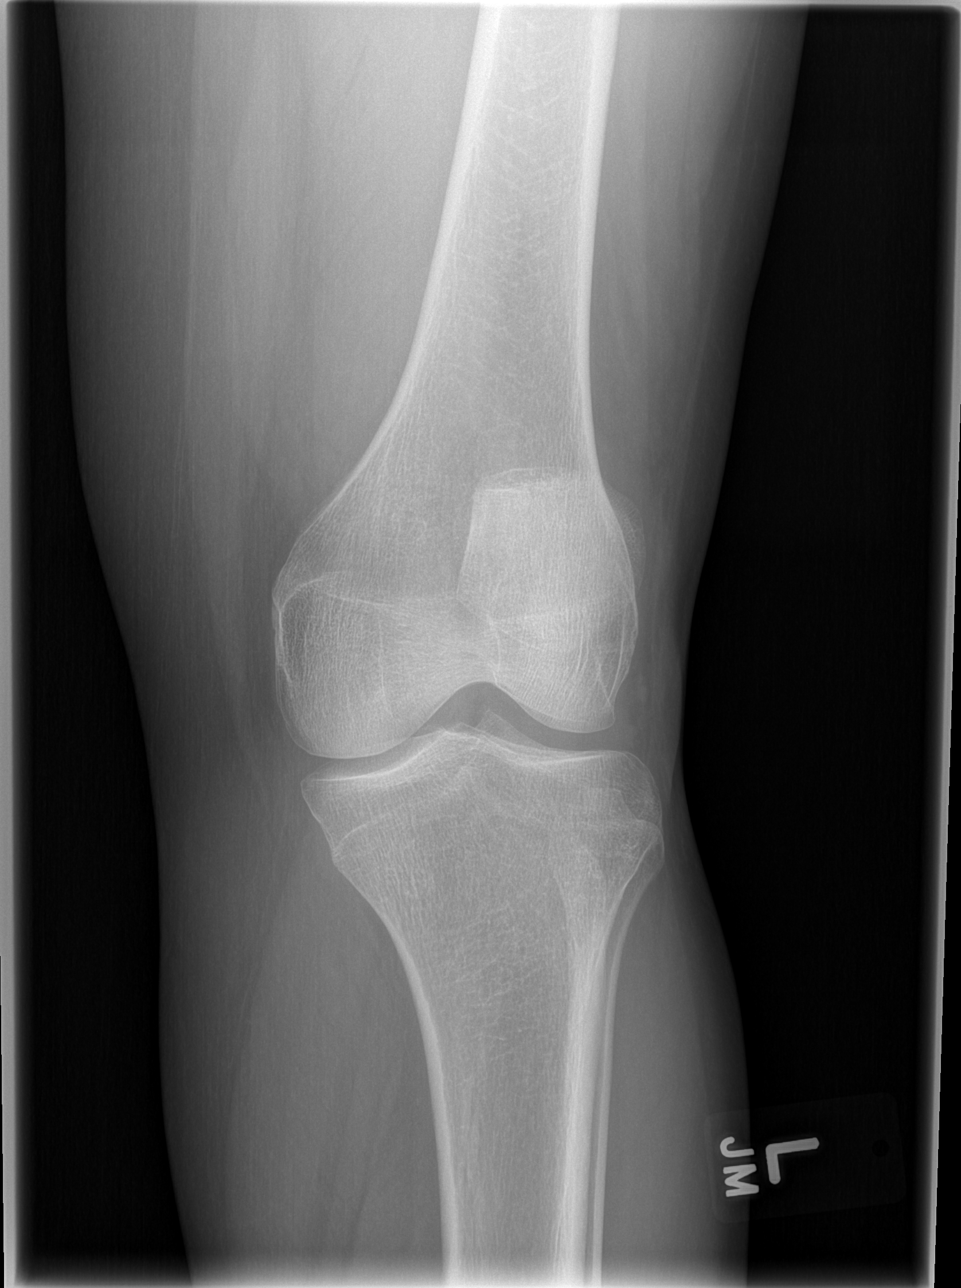

[t knee lat left]
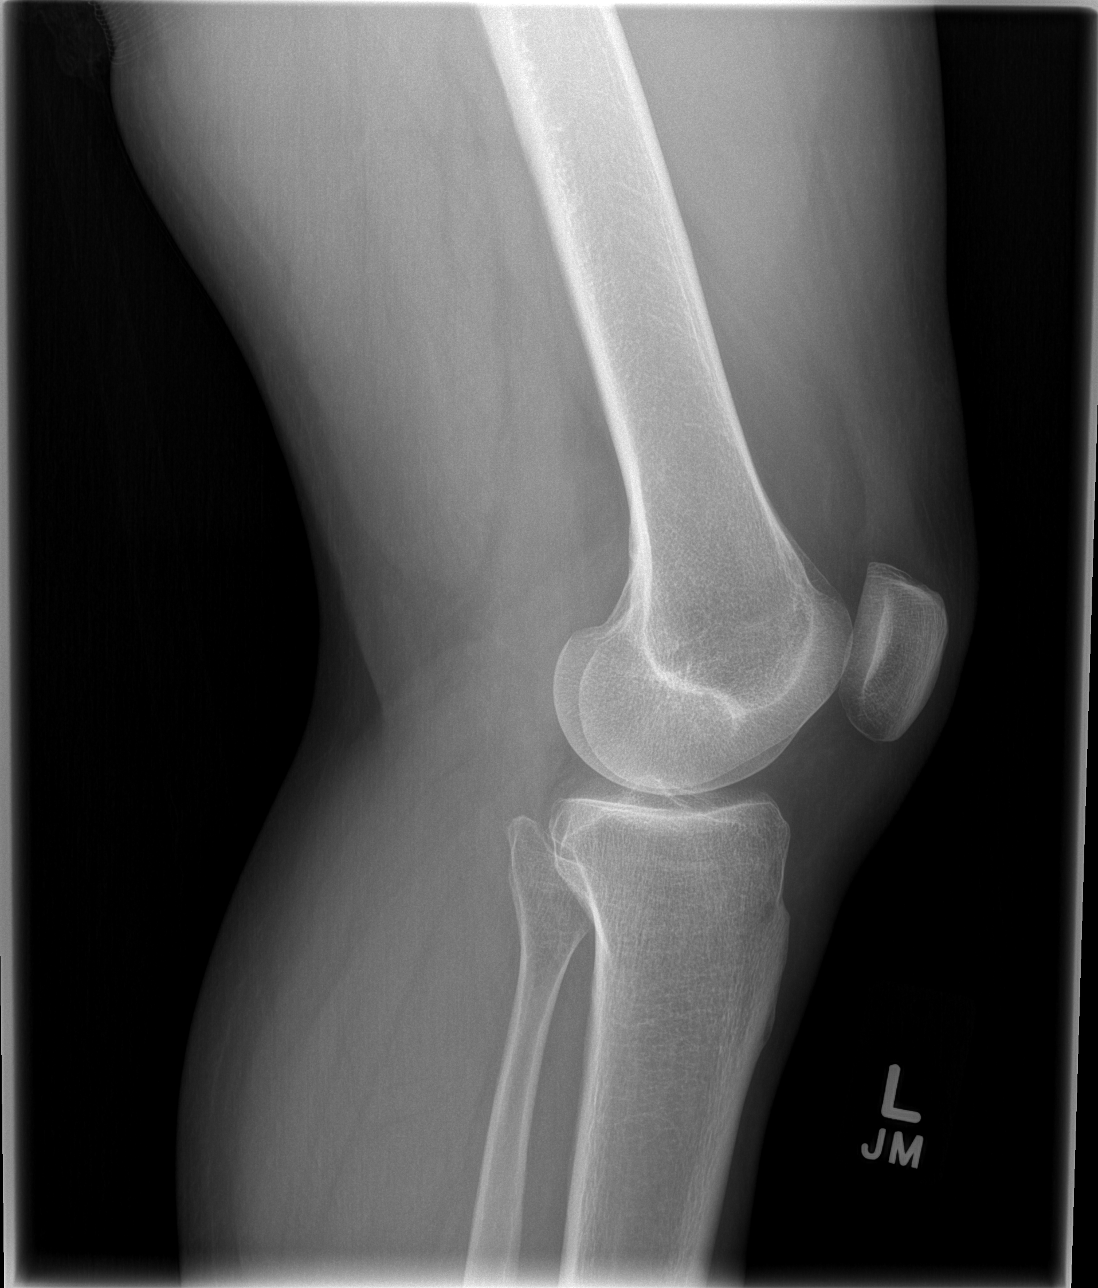

[4 of 4 positions shown; findings below may reference images not displayed]

FINDINGS: No evidence of fracture, dislocation, or joint effusion. Normal
mineralization. No evidence of arthropathy or other focal bone
abnormality. Soft tissues are unremarkable.
IMPRESSION: Negative left knee radiographs.

## 2021-01-16 IMAGING — MR MR HUMERUS*R* WO/W CM
5 of 10 series · 21 of 40 positions shown · IV contrast (multihance)
Comparison: None.

CLINICAL DATA: Soft tissue mass along the right upper arm. Areas
marked with skin markers and confirmed by the patient.

EXAM:
MRI OF THE RIGHT HUMERUS WITHOUT AND WITH CONTRAST
TECHNIQUE: Multiplanar, multisequence MR imaging of the right humerus was
performed before and after the administration of intravenous
contrast.
CONTRAST:  19mL MULTIHANCE GADOBENATE DIMEGLUMINE 529 MG/ML IV SOLN

[Series 5: T1 · axial · right · 4.0mm · 0.40mm/px · z∈[-21,+189]mm · 6 of 53 slices shown (1 of 2)]
[im 1/53]
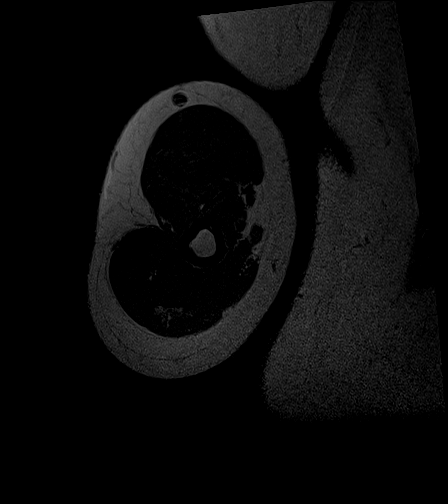
[im 11/53]
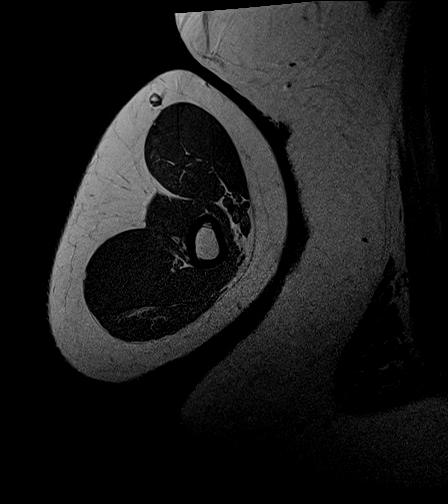
[im 21/53]
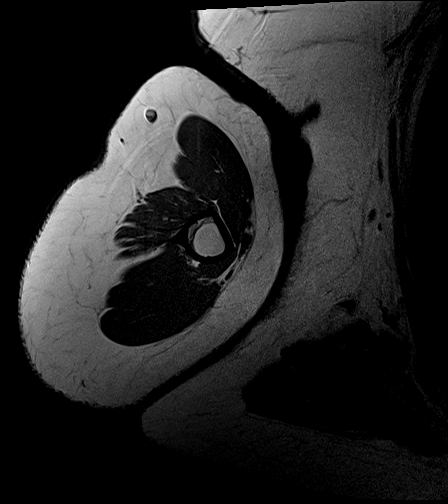
[im 32/53]
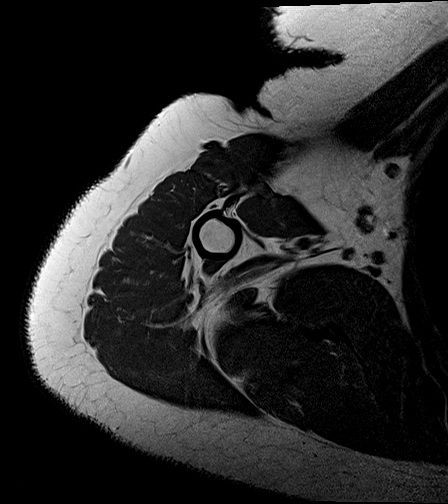
[im 42/53]
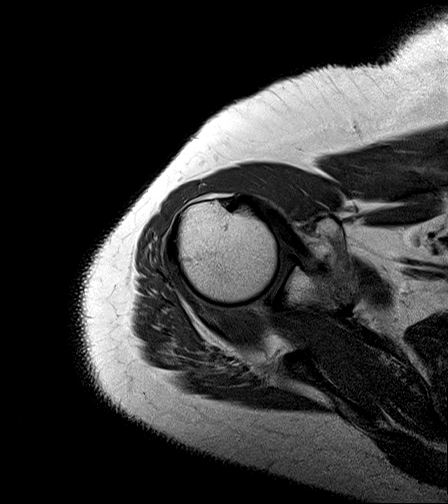
[im 53/53]
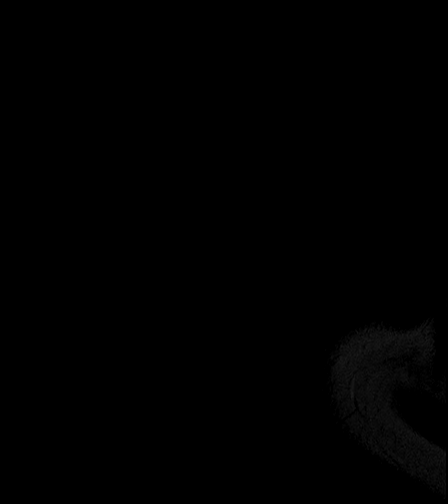

[Series 6: T2 fat-sat · axial · right · 4.0mm · 0.56mm/px · z∈[-21,+189]mm · 6 of 53 slices shown (1 of 2)]
[im 1/53]
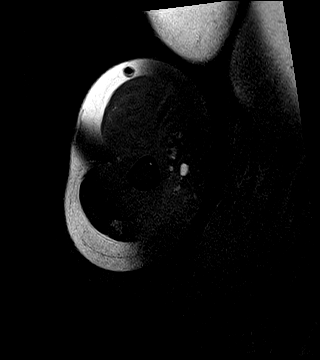
[im 11/53]
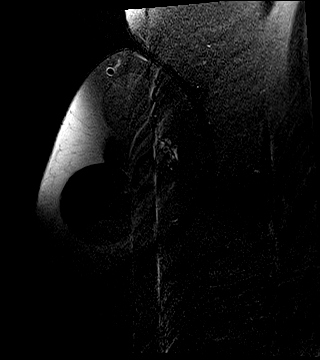
[im 21/53]
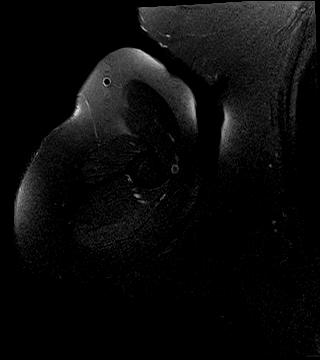
[im 32/53]
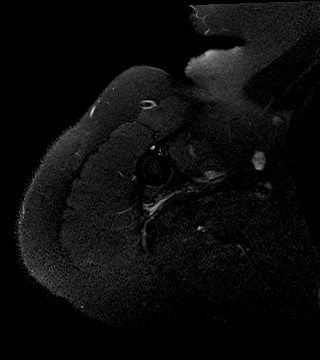
[im 42/53]
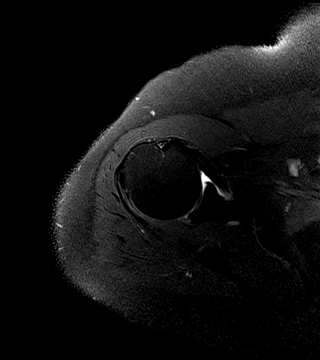
[im 53/53]
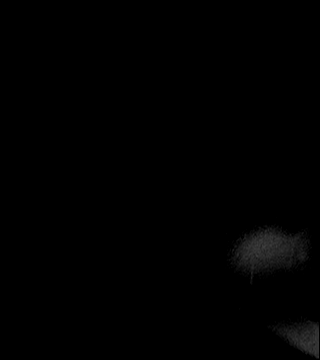

[Series 7: T1 fat-sat · axial · right · 4.0mm · 0.40mm/px · z∈[-21,+189]mm · 5 of 53 slices shown]
[im 1/53]
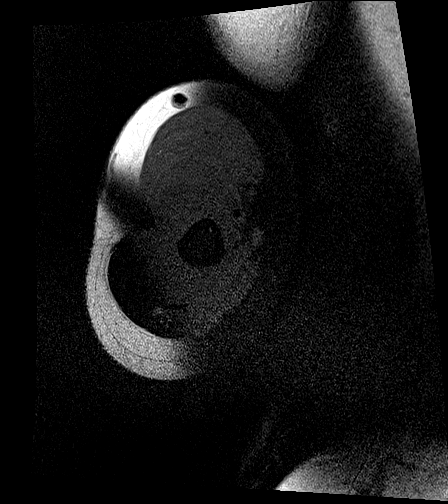
[im 14/53]
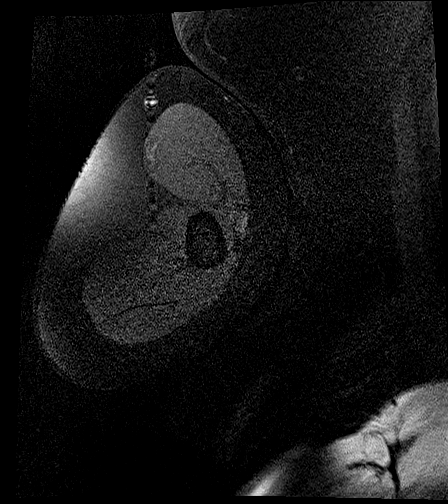
[im 27/53]
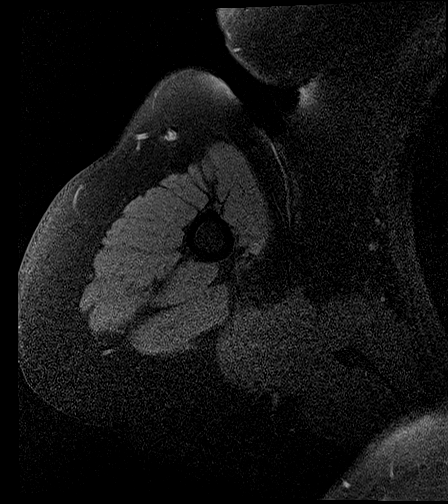
[im 40/53]
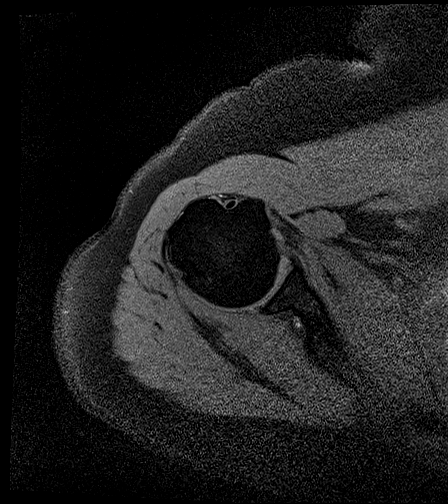
[im 53/53]
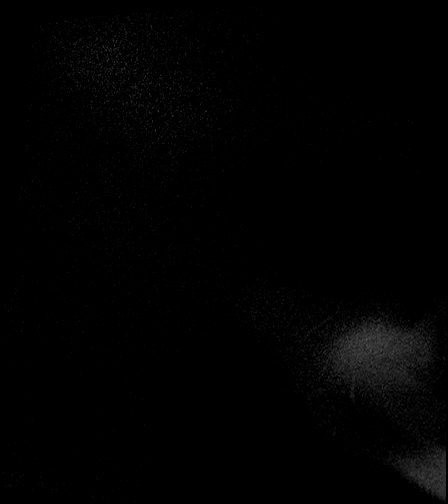

[Series 8: T1 · oblique · right · 4.0mm · 0.29mm/px · 1 of 35 slices shown (2 of 2)]
[im 1/35]
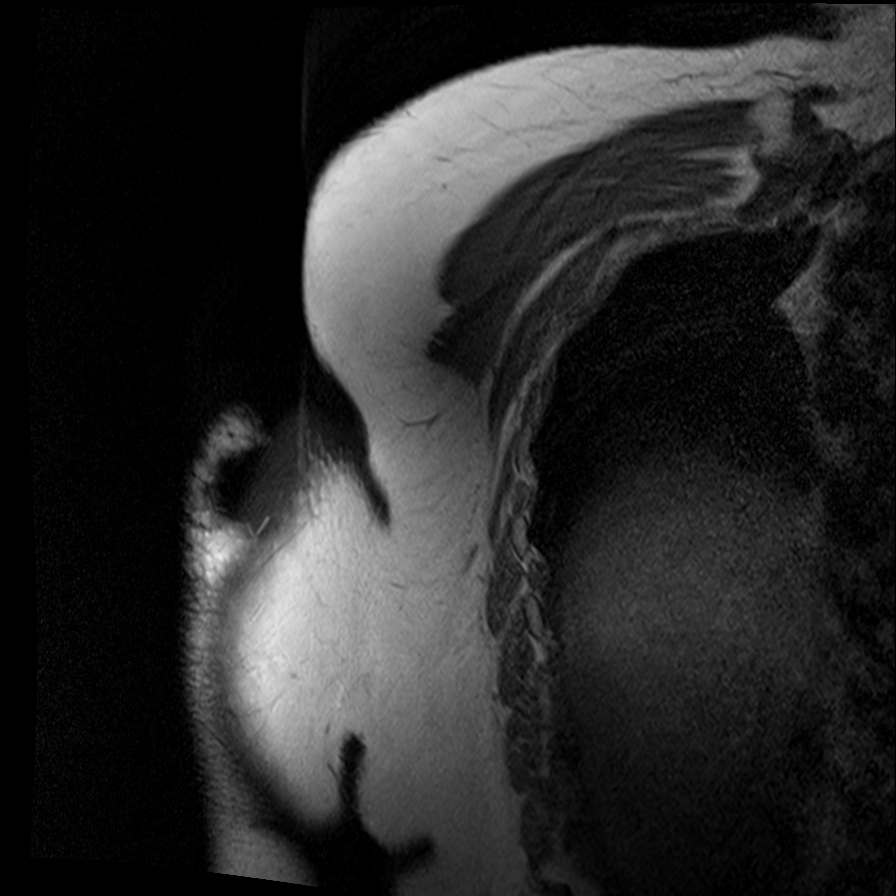

[Series 9: T2 fat-sat · oblique · right · 4.0mm · 0.58mm/px · 3 of 35 slices shown (2 of 2)]
[im 1/35]
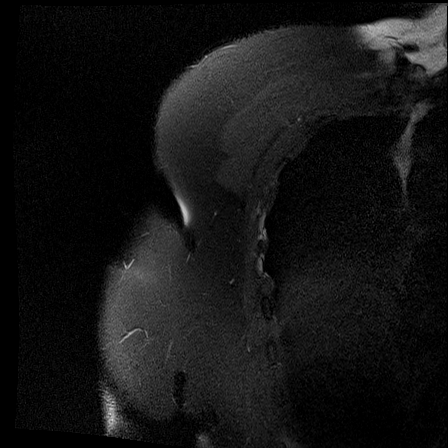
[im 18/35]
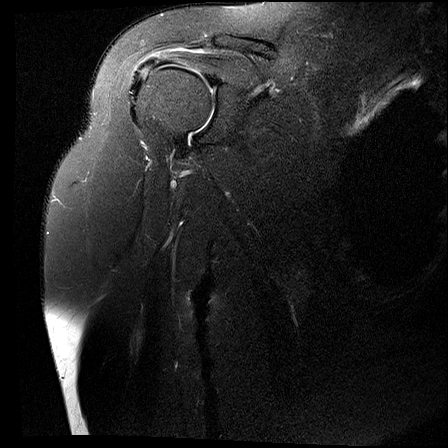
[im 35/35]
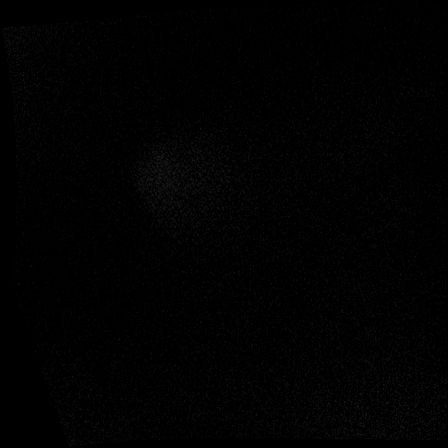

[21 of 40 positions shown; findings below may reference images not displayed]

FINDINGS: Bones/Joint/Cartilage

No marrow signal abnormality. No fracture or dislocation. Normal
alignment. No joint effusion.

Ligaments, Muscles and Tendons
Muscles are normal. No intramuscular fluid collection or hematoma.
Severe tendinosis of the supraspinatus tendon with a insertional
interstitial tear with possible articular surface extension. Mild
tendinosis of the infraspinatus tendon.

Soft tissue
No fluid collection or hematoma.

Irregularly marginated T1 intermediate signal, mildly T2
hyperintense with central T2 hypointensity, and enhancement on
postcontrast imaging with the overall area measuring approximately
13 x 8 x 12 mm within the subcutaneous fat overlying the deltoid
muscle most consistent with an area of fat necrosis. Slightly more
posterior to this area there is a 8 x 8 x 9 mm T1 mildly
hyperintense well-circumscribed mass with complete signal dropout on
fat saturated images and a small area of low signal posteriorly most
consistent with a secondary a fat necrosis or oil cyst.

No other soft tissue mass.
IMPRESSION: 1. At the site of clinical palpable abnormality in the upper lateral
right arm there there is a soft tissue area of abnormality most
consistent with an area of fat necrosis. There is a second area
slightly more posteriorly which may reflect an adjacent area of fat
necrosis versus an oil cyst.
2. Severe tendinosis of the supraspinatus tendon with a insertional
interstitial tear with possible articular surface extension.
3. Mild tendinosis of the infraspinatus tendon.

## 2021-03-08 ENCOUNTER — Other Ambulatory Visit (HOSPITAL_COMMUNITY): Payer: Self-pay

## 2021-03-08 DIAGNOSIS — E782 Mixed hyperlipidemia: Secondary | ICD-10-CM | POA: Diagnosis not present

## 2021-03-08 DIAGNOSIS — R7303 Prediabetes: Secondary | ICD-10-CM | POA: Diagnosis not present

## 2021-03-08 DIAGNOSIS — Z72821 Inadequate sleep hygiene: Secondary | ICD-10-CM | POA: Diagnosis not present

## 2021-03-08 DIAGNOSIS — Z7189 Other specified counseling: Secondary | ICD-10-CM | POA: Diagnosis not present

## 2021-03-08 DIAGNOSIS — E669 Obesity, unspecified: Secondary | ICD-10-CM | POA: Diagnosis not present

## 2021-03-08 DIAGNOSIS — I10 Essential (primary) hypertension: Secondary | ICD-10-CM | POA: Diagnosis not present

## 2021-03-08 DIAGNOSIS — F411 Generalized anxiety disorder: Secondary | ICD-10-CM | POA: Diagnosis not present

## 2021-03-08 MED ORDER — VALSARTAN-HYDROCHLOROTHIAZIDE 160-12.5 MG PO TABS
1.0000 | ORAL_TABLET | Freq: Every day | ORAL | 0 refills | Status: DC
  Filled 2021-03-08: qty 27, 27d supply, fill #0
  Filled 2021-03-08: qty 63, 63d supply, fill #0
  Filled 2021-05-10: qty 90, 90d supply, fill #0

## 2021-03-16 ENCOUNTER — Other Ambulatory Visit (HOSPITAL_COMMUNITY): Payer: Self-pay

## 2021-04-10 DIAGNOSIS — M2042 Other hammer toe(s) (acquired), left foot: Secondary | ICD-10-CM | POA: Diagnosis not present

## 2021-04-10 DIAGNOSIS — M65872 Other synovitis and tenosynovitis, left ankle and foot: Secondary | ICD-10-CM | POA: Diagnosis not present

## 2021-04-10 DIAGNOSIS — M25572 Pain in left ankle and joints of left foot: Secondary | ICD-10-CM | POA: Diagnosis not present

## 2021-05-10 ENCOUNTER — Other Ambulatory Visit (HOSPITAL_COMMUNITY): Payer: Self-pay

## 2021-05-15 DIAGNOSIS — Z1231 Encounter for screening mammogram for malignant neoplasm of breast: Secondary | ICD-10-CM | POA: Diagnosis not present

## 2021-05-15 LAB — HM MAMMOGRAPHY

## 2021-09-11 ENCOUNTER — Telehealth: Payer: 59 | Admitting: Physician Assistant

## 2021-09-11 ENCOUNTER — Other Ambulatory Visit (HOSPITAL_COMMUNITY): Payer: Self-pay

## 2021-09-11 DIAGNOSIS — I10 Essential (primary) hypertension: Secondary | ICD-10-CM

## 2021-09-11 DIAGNOSIS — U071 COVID-19: Secondary | ICD-10-CM | POA: Diagnosis not present

## 2021-09-11 MED ORDER — VALSARTAN-HYDROCHLOROTHIAZIDE 160-12.5 MG PO TABS
1.0000 | ORAL_TABLET | Freq: Every day | ORAL | 0 refills | Status: DC
  Filled 2021-09-11: qty 90, 90d supply, fill #0

## 2021-09-11 MED ORDER — CYCLOBENZAPRINE HCL 10 MG PO TABS
5.0000 mg | ORAL_TABLET | Freq: Three times a day (TID) | ORAL | 0 refills | Status: DC | PRN
  Filled 2021-09-11: qty 30, 10d supply, fill #0

## 2021-09-11 MED ORDER — ONDANSETRON HCL 4 MG PO TABS
4.0000 mg | ORAL_TABLET | Freq: Three times a day (TID) | ORAL | 0 refills | Status: DC | PRN
  Filled 2021-09-11: qty 20, 7d supply, fill #0

## 2021-09-11 NOTE — Patient Instructions (Signed)
Maurine Minister, thank you for joining Mar Daring, PA-C for today's virtual visit.  While this provider is not your primary care provider (PCP), if your PCP is located in our provider database this encounter information will be shared with them immediately following your visit.  Consent: (Patient) Jamie Watson provided verbal consent for this virtual visit at the beginning of the encounter.  Current Medications:  Current Outpatient Medications:    cyclobenzaprine (FLEXERIL) 10 MG tablet, Take 0.5-1 tablets (5-10 mg total) by mouth 3 (three) times daily as needed for muscle spasms., Disp: 30 tablet, Rfl: 0   ondansetron (ZOFRAN) 4 MG tablet, Take 1 tablet (4 mg total) by mouth every 8 (eight) hours as needed for nausea or vomiting., Disp: 20 tablet, Rfl: 0   amLODipine (NORVASC) 2.5 MG tablet, TAKE 1 TABLET BY MOUTH AT BEDTIME, Disp: 90 tablet, Rfl: 0   amLODipine (NORVASC) 5 MG tablet, Take 1 tablet (5 mg total) by mouth at bedtime., Disp: 90 tablet, Rfl: 0   benzonatate (TESSALON) 100 MG capsule, Take 1 capsule (100 mg total) by mouth every 8 (eight) hours., Disp: 21 capsule, Rfl: 0   Estradiol 10 MCG TABS vaginal tablet, Insert 1 Tablet as directed  vaginally daily for two weeks then twice a week, Disp: 30 tablet, Rfl: 11   ibuprofen (ADVIL) 400 MG tablet, Take 1 tablet (400 mg total) by mouth every 6 (six) hours as needed., Disp: 12 tablet, Rfl: 0   metoprolol succinate (TOPROL-XL) 25 MG 24 hr tablet, Take 1 tablet (25 mg total) by mouth every morning., Disp: 30 tablet, Rfl: 0   naproxen (NAPROSYN) 500 MG tablet, TAKE 1 TABLET BY MOUTH TWICE DAILY, Disp: 60 tablet, Rfl: 0   predniSONE (DELTASONE) 20 MG tablet, take 2 tablets by mouth daily for 3 days, Disp: 6 tablet, Rfl: 0   valsartan-hydrochlorothiazide (DIOVAN-HCT) 160-12.5 MG tablet, Take 1 tablet by mouth daily., Disp: 90 tablet, Rfl: 0   Medications ordered in this encounter:  Meds ordered this encounter  Medications    ondansetron (ZOFRAN) 4 MG tablet    Sig: Take 1 tablet (4 mg total) by mouth every 8 (eight) hours as needed for nausea or vomiting.    Dispense:  20 tablet    Refill:  0    Order Specific Question:   Supervising Provider    Answer:   MILLER, BRIAN [[id number]]   valsartan-hydrochlorothiazide (DIOVAN-HCT) 160-12.5 MG tablet    Sig: Take 1 tablet by mouth daily.    Dispense:  90 tablet    Refill:  0    Order Specific Question:   Supervising Provider    Answer:   [name] Heck, BRIAN [[id number]]   cyclobenzaprine (FLEXERIL) 10 MG tablet    Sig: Take 0.5-1 tablets (5-10 mg total) by mouth 3 (three) times daily as needed for muscle spasms.    Dispense:  30 tablet    Refill:  0    Order Specific Question:   Supervising Provider    Answer:   [name] Heck, BRIAN [[id number]]     *If you need refills on other medications prior to your next appointment, please contact your pharmacy*  Follow-Up: Call back or seek an in-person evaluation if the symptoms worsen or if the condition fails to improve as anticipated.  Other Instructions COVID-19: Quarantine and Isolation Quarantine If you were exposed Quarantine and stay away from others when you have been in close contact with someone who has COVID-19. Isolate If you are sick or test positive  Isolate when you are sick or when you have COVID-19, even if you don't have symptoms. When to stay home Calculating quarantine The date of your exposure is considered day 0. Day 1 is the first full day after your last contact with a person who has had COVID-19. Stay home and away from other people for at least 5 days. Learn why CDC updated guidance for the general public. IF YOU were exposed to COVID-19 and are NOT  up to dateIF YOU were exposed to COVID-19 and are NOT on COVID-19 vaccinations Quarantine for at least 5 days Stay home Stay home and quarantine for at least 5 full days. Wear a well-fitting mask if you must be around others in your home. Do not travel. Get  tested Even if you don't develop symptoms, get tested at least 5 days after you last had close contact with someone with COVID-19. After quarantine Watch for symptoms Watch for symptoms until 10 days after you last had close contact with someone with COVID-19. Avoid travel It is best to avoid travel until a full 10 days after you last had close contact with someone with COVID-19. If you develop symptoms Isolate immediately and get tested. Continue to stay home until you know the results. Wear a well-fitting mask around others. Take precautions until day 10 Wear a well-fitting mask Wear a well-fitting mask for 10 full days any time you are around others inside your home or in public. Do not go to places where you are unable to wear a well-fitting mask. If you must travel during days 6-10, take precautions. Avoid being around people who are more likely to get very sick from COVID-19. IF YOU were exposed to COVID-19 and are  up to dateIF YOU were exposed to COVID-19 and are on COVID-19 vaccinations No quarantine You do not need to stay home unless you develop symptoms. Get tested Even if you don't develop symptoms, get tested at least 5 days after you last had close contact with someone with COVID-19. Watch for symptoms Watch for symptoms until 10 days after you last had close contact with someone with COVID-19. If you develop symptoms Isolate immediately and get tested. Continue to stay home until you know the results. Wear a well-fitting mask around others. Take precautions until day 10 Wear a well-fitting mask Wear a well-fitting mask for 10 full days any time you are around others inside your home or in public. Do not go to places where you are unable to wear a well-fitting mask. Take precautions if traveling Avoid being around people who are more likely to get very sick from COVID-19. IF YOU were exposed to COVID-19 and had confirmed COVID-19 within the past 90 days (you tested  positive using a viral test) No quarantine You do not need to stay home unless you develop symptoms. Watch for symptoms Watch for symptoms until 10 days after you last had close contact with someone with COVID-19. If you develop symptoms Isolate immediately and get tested. Continue to stay home until you know the results. Wear a well-fitting mask around others. Take precautions until day 10 Wear a well-fitting mask Wear a well-fitting mask for 10 full days any time you are around others inside your home or in public. Do not go to places where you are unable to wear a well-fitting mask. Take precautions if traveling Avoid being around people who are more likely to get very sick from COVID-19. Calculating isolation Day 0 is your first day of symptoms  or a positive viral test. Day 1 is the first full day after your symptoms developed or your test specimen was collected. If you have COVID-19 or have symptoms, isolate for at least 5 days. IF YOU tested positive for COVID-19 or have symptoms, regardless of vaccination status Stay home for at least 5 days Stay home for 5 days and isolate from others in your home. Wear a well-fitting mask if you must be around others in your home. Do not travel. Ending isolation if you had symptoms End isolation after 5 full days if you are fever-free for 24 hours (without the use of fever-reducing medication) and your symptoms are improving. Ending isolation if you did NOT have symptoms End isolation after at least 5 full days after your positive test. If you got very sick from COVID-19 or have a weakened immune system You should isolate for at least 10 days. Consult your doctor before ending isolation. Take precautions until day 10 Wear a well-fitting mask Wear a well-fitting mask for 10 full days any time you are around others inside your home or in public. Do not go to places where you are unable to wear a well-fitting mask. Do not travel Do not travel until  a full 10 days after your symptoms started or the date your positive test was taken if you had no symptoms. Avoid being around people who are more likely to get very sick from COVID-19. Definitions Exposure Contact with someone infected with SARS-CoV-2, the virus that causes COVID-19, in a way that increases the likelihood of getting infected with the virus. Close contact A close contact is someone who was less than 6 feet away from an infected person (laboratory-confirmed or a clinical diagnosis) for a cumulative total of 15 minutes or more over a 24-hour period. For example, three individual 5-minute exposures for a total of 15 minutes. People who are exposed to someone with COVID-19 after they completed at least 5 days of isolation are not considered close contacts. Julio Sicks is a strategy used to prevent transmission of COVID-19 by keeping people who have been in close contact with someone with COVID-19 apart from others. Who does not need to quarantine? If you had close contact with someone with COVID-19 and you are in one of the following groups, you do not need to quarantine. You are up to date with your COVID-19 vaccines. You had confirmed COVID-19 within the last 90 days (meaning you tested positive using a viral test). If you are up to date with COVID-19 vaccines, you should wear a well-fitting mask around others for 10 days from the date of your last close contact with someone with COVID-19 (the date of last close contact is considered day 0). Get tested at least 5 days after you last had close contact with someone with COVID-19. If you test positive or develop COVID-19 symptoms, isolate from other people and follow recommendations in the Isolation section below. If you tested positive for COVID-19 with a viral test within the previous 90 days and subsequently recovered and remain without COVID-19 symptoms, you do not need to quarantine or get tested after close contact. You  should wear a well-fitting mask around others for 10 days from the date of your last close contact with someone with COVID-19 (the date of last close contact is considered day 0). If you have COVID-19 symptoms, get tested and isolate from other people and follow recommendations in the Isolation section below. Who should quarantine? If you come into close  contact with someone with COVID-19, you should quarantine if you are not up to date on COVID-19 vaccines. This includes people who are not vaccinated. What to do for quarantine Stay home and away from other people for at least 5 days (day 0 through day 5) after your last contact with a person who has COVID-19. The date of your exposure is considered day 0. Wear a well-fitting mask when around others at home, if possible. For 10 days after your last close contact with someone with COVID-19, watch for fever (100.67F or greater), cough, shortness of breath, or other COVID-19 symptoms. If you develop symptoms, get tested immediately and isolate until you receive your test results. If you test positive, follow isolation recommendations. If you do not develop symptoms, get tested at least 5 days after you last had close contact with someone with COVID-19. If you test negative, you can leave your home, but continue to wear a well-fitting mask when around others at home and in public until 10 days after your last close contact with someone with COVID-19. If you test positive, you should isolate for at least 5 days from the date of your positive test (if you do not have symptoms). If you do develop COVID-19 symptoms, isolate for at least 5 days from the date your symptoms began (the date the symptoms started is day 0). Follow recommendations in the isolation section below. If you are unable to get a test 5 days after last close contact with someone with COVID-19, you can leave your home after day 5 if you have been without COVID-19 symptoms throughout the 5-day  period. Wear a well-fitting mask for 10 days after your date of last close contact when around others at home and in public. Avoid people who are have weakened immune systems or are more likely to get very sick from COVID-19, and nursing homes and other high-risk settings, until after at least 10 days. If possible, stay away from people you live with, especially people who are at higher risk for getting very sick from COVID-19, as well as others outside your home throughout the full 10 days after your last close contact with someone with COVID-19. If you are unable to quarantine, you should wear a well-fitting mask for 10 days when around others at home and in public. If you are unable to wear a mask when around others, you should continue to quarantine for 10 days. Avoid people who have weakened immune systems or are more likely to get very sick from COVID-19, and nursing homes and other high-risk settings, until after at least 10 days. See additional information about travel. Do not go to places where you are unable to wear a mask, such as restaurants and some gyms, and avoid eating around others at home and at work until after 10 days after your last close contact with someone with COVID-19. After quarantine Watch for symptoms until 10 days after your last close contact with someone with COVID-19. If you have symptoms, isolate immediately and get tested. Quarantine in high-risk congregate settings In certain congregate settings that have high risk of secondary transmission (such as Systems analyst and detention facilities, homeless shelters, or cruise ships), CDC recommends a 10-day quarantine for residents, regardless of vaccination and booster status. During periods of critical staffing shortages, facilities may consider shortening the quarantine period for staff to ensure continuity of operations. Decisions to shorten quarantine in these settings should be made in consultation with state, local,  tribal, or territorial health  departments and should take into consideration the context and characteristics of the facility. CDC's setting-specific guidance provides additional recommendations for these settings. Isolation Isolation is used to separate people with confirmed or suspected COVID-19 from those without COVID-19. People who are in isolation should stay home until it's safe for them to be around others. At home, anyone sick or infected should separate from others, or wear a well-fitting mask when they need to be around others. People in isolation should stay in a specific "sick room" or area and use a separate bathroom if available. Everyone who has presumed or confirmed COVID-19 should stay home and isolate from other people for at least 5 full days (day 0 is the first day of symptoms or the date of the day of the positive viral test for asymptomatic persons). They should wear a mask when around others at home and in public for an additional 5 days. People who are confirmed to have COVID-19 or are showing symptoms of COVID-19 need to isolate regardless of their vaccination status. This includes: People who have a positive viral test for COVID-19, regardless of whether or not they have symptoms. People with symptoms of COVID-19, including people who are awaiting test results or have not been tested. People with symptoms should isolate even if they do not know if they have been in close contact with someone with COVID-19. What to do for isolation Monitor your symptoms. If you have an emergency warning sign (including trouble breathing), seek emergency medical care immediately. Stay in a separate room from other household members, if possible. Use a separate bathroom, if possible. Take steps to improve ventilation at home, if possible. Avoid contact with other members of the household and pets. Don't share personal household items, like cups, towels, and utensils. Wear a well-fitting mask when  you need to be around other people. Learn more about what to do if you are sick and how to notify your contacts. Ending isolation for people who had COVID-19 and had symptoms If you had COVID-19 and had symptoms, isolate for at least 5 days. To calculate your 5-day isolation period, day 0 is your first day of symptoms. Day 1 is the first full day after your symptoms developed. You can leave isolation after 5 full days. You can end isolation after 5 full days if you are fever-free for 24 hours without the use of fever-reducing medication and your other symptoms have improved (Loss of taste and smell may persist for weeks or months after recovery and need not delay the end of isolation). You should continue to wear a well-fitting mask around others at home and in public for 5 additional days (day 6 through day 10) after the end of your 5-day isolation period. If you are unable to wear a mask when around others, you should continue to isolate for a full 10 days. Avoid people who have weakened immune systems or are more likely to get very sick from COVID-19, and nursing homes and other high-risk settings, until after at least 10 days. If you continue to have fever or your other symptoms have not improved after 5 days of isolation, you should wait to end your isolation until you are fever-free for 24 hours without the use of fever-reducing medication and your other symptoms have improved. Continue to wear a well-fitting mask through day 10. Contact your healthcare provider if you have questions. See additional information about travel. Do not go to places where you are unable to wear a mask,  such as restaurants and some gyms, and avoid eating around others at home and at work until a full 10 days after your first day of symptoms. If an individual has access to a test and wants to test, the best approach is to use an antigen test1 towards the end of the 5-day isolation period. Collect the test sample only if you  are fever-free for 24 hours without the use of fever-reducing medication and your other symptoms have improved (loss of taste and smell may persist for weeks or months after recovery and need not delay the end of isolation). If your test result is positive, you should continue to isolate until day 10. If your test result is negative, you can end isolation, but continue to wear a well-fitting mask around others at home and in public until day 10. Follow additional recommendations for masking and avoiding travel as described above. 1As noted in the labeling for authorized over-the counter antigen tests: Negative results should be treated as presumptive. Negative results do not rule out SARS-CoV-2 infection and should not be used as the sole basis for treatment or patient management decisions, including infection control decisions. To improve results, antigen tests should be used twice over a three-day period with at least 24 hours and no more than 48 hours between tests. Note that these recommendations on ending isolation do not apply to people who are moderately ill or very sick from COVID-19 or have weakened immune systems. See section below for recommendations for when to end isolation for these groups. Ending isolation for people who tested positive for COVID-19 but had no symptoms If you test positive for COVID-19 and never develop symptoms, isolate for at least 5 days. Day 0 is the day of your positive viral test (based on the date you were tested) and day 1 is the first full day after the specimen was collected for your positive test. You can leave isolation after 5 full days. If you continue to have no symptoms, you can end isolation after at least 5 days. You should continue to wear a well-fitting mask around others at home and in public until day 10 (day 6 through day 10). If you are unable to wear a mask when around others, you should continue to isolate for 10 days. Avoid people who have weakened  immune systems or are more likely to get very sick from COVID-19, and nursing homes and other high-risk settings, until after at least 10 days. If you develop symptoms after testing positive, your 5-day isolation period should start over. Day 0 is your first day of symptoms. Follow the recommendations above for ending isolation for people who had COVID-19 and had symptoms. See additional information about travel. Do not go to places where you are unable to wear a mask, such as restaurants and some gyms, and avoid eating around others at home and at work until 10 days after the day of your positive test. If an individual has access to a test and wants to test, the best approach is to use an antigen test1 towards the end of the 5-day isolation period. If your test result is positive, you should continue to isolate until day 10. If your test result is positive, you can also choose to test daily and if your test result is negative, you can end isolation, but continue to wear a well-fitting mask around others at home and in public until day 10. Follow additional recommendations for masking and avoiding travel as described above.  1As noted in the labeling for authorized over-the counter antigen tests: Negative results should be treated as presumptive. Negative results do not rule out SARS-CoV-2 infection and should not be used as the sole basis for treatment or patient management decisions, including infection control decisions. To improve results, antigen tests should be used twice over a three-day period with at least 24 hours and no more than 48 hours between tests. Ending isolation for people who were moderately or very sick from COVID-19 or have a weakened immune system People who are moderately ill from COVID-19 (experiencing symptoms that affect the lungs like shortness of breath or difficulty breathing) should isolate for 10 days and follow all other isolation precautions. To calculate your 10-day isolation  period, day 0 is your first day of symptoms. Day 1 is the first full day after your symptoms developed. If you are unsure if your symptoms are moderate, talk to a healthcare provider for further guidance. People who are very sick from COVID-19 (this means people who were hospitalized or required intensive care or ventilation support) and people who have weakened immune systems might need to isolate at home longer. They may also require testing with a viral test to determine when they can be around others. CDC recommends an isolation period of at least 10 and up to 20 days for people who were very sick from COVID-19 and for people with weakened immune systems. Consult with your healthcare provider about when you can resume being around other people. If you are unsure if your symptoms are severe or if you have a weakened immune system, talk to a healthcare provider for further guidance. People who have a weakened immune system should talk to their healthcare provider about the potential for reduced immune responses to COVID-19 vaccines and the need to continue to follow current prevention measures (including wearing a well-fitting mask and avoiding crowds and poorly ventilated indoor spaces) to protect themselves against COVID-19 until advised otherwise by their healthcare provider. Close contacts of immunocompromised people--including household members--should also be encouraged to receive all recommended COVID-19 vaccine doses to help protect these people. Isolation in high-risk congregate settings In certain high-risk congregate settings that have high risk of secondary transmission and where it is not feasible to cohort people (such as Systems analyst and detention facilities, homeless shelters, and cruise ships), CDC recommends a 10-day isolation period for residents. During periods of critical staffing shortages, facilities may consider shortening the isolation period for staff to ensure continuity of  operations. Decisions to shorten isolation in these settings should be made in consultation with state, local, tribal, or territorial health departments and should take into consideration the context and characteristics of the facility. CDC's setting-specific guidance provides additional recommendations for these settings. This CDC guidance is meant to supplement--not replace--any federal, state, local, territorial, or tribal health and safety laws, rules, and regulations. Recommendations for specific settings These recommendations do not apply to healthcare professionals. For guidance specific to these settings, see Healthcare professionals: Interim Guidance for Optician, dispensing with SARS-CoV-2 Infection or Exposure to SARS-CoV-2 Patients, residents, and visitors to healthcare settings: Interim Infection Prevention and Control Recommendations for Healthcare Personnel During the [place] 2019 (COVID-19) Pandemic Additional setting-specific guidance and recommendations are available. These recommendations on quarantine and isolation do apply to [place] settings. Additional guidance is available here: Overview of COVID-19 Quarantine for [employer] Travelers: Travel information and recommendations Congregate facilities and other settings: [employer] for community, work, and school settings Ongoing COVID-19 exposure FAQs  I live with someone with COVID-19, but I cannot be separated from them. How do we manage quarantine in this situation? It is very important for people with COVID-19 to remain apart from other people, if possible, even if they are living together. If separation of the person with COVID-19 from others that they live with is not possible, the other people that they live with will have ongoing exposure, meaning they will be repeatedly exposed until that person is no longer able to spread the virus to other people. In this situation, there are precautions you can  take to limit the spread of COVID-19: The person with COVID-19 and everyone they live with should wear a well-fitting mask inside the home. If possible, one person should care for the person with COVID-19 to limit the number of people who are in close contact with the infected person. Take steps to protect yourself and others to reduce transmission in the home: Quarantine if you are not up to date with your COVID-19 vaccines. Isolate if you are sick or tested positive for COVID-19, even if you don't have symptoms. Learn more about the public health recommendations for testing, mask use and quarantine of close contacts, like yourself, who have ongoing exposure. These recommendations differ depending on your vaccination status. What should I do if I have ongoing exposure to COVID-19 from someone I live with? Recommendations for this situation depend on your vaccination status: If you are not up to date on COVID-19 vaccines and have ongoing exposure to COVID-19, you should: Begin quarantine immediately and continue to quarantine throughout the isolation period of the person with COVID-19. Continue to quarantine for an additional 5 days starting the day after the end of isolation for the person with COVID-19. Get tested at least 5 days after the end of isolation of the infected person that lives with them. If you test negative, you can leave the home but should continue to wear a well-fitting mask when around others at home and in public until 10 days after the end of isolation for the person with COVID-19. Isolate immediately if you develop symptoms of COVID-19 or test positive. If you are up to date with COVID-19 vaccines and have ongoing exposure to COVID-19, you should: Get tested at least 5 days after your first exposure. A person with COVID-19 is considered infectious starting 2 days before they develop symptoms, or 2 days before the date of their positive test if they do not have symptoms. Get  tested again at least 5 days after the end of isolation for the person with COVID-19. Wear a well-fitting mask when you are around the person with COVID-19, and do this throughout their isolation period. Wear a well-fitting mask around others for 10 days after the infected person's isolation period ends. Isolate immediately if you develop symptoms of COVID-19 or test positive. What should I do if multiple people I live with test positive for COVID-19 at different times? Recommendations for this situation depend on your vaccination status: If you are not up to date with your COVID-19 vaccines, you should: Quarantine throughout the isolation period of any infected person that you live with. Continue to quarantine until 5 days after the end of isolation date for the most recently infected person that lives with you. For example, if the last day of isolation of the person most recently infected with COVID-19 was June 30, the new 5-day quarantine period starts on July 1. Get tested at least 5 days after the end  of isolation for the most recently infected person that lives with you. Wear a well-fitting mask when you are around any person with COVID-19 while that person is in isolation. Wear a well-fitting mask when you are around other people until 10 days after your last close contact. Isolate immediately if you develop symptoms of COVID-19 or test positive. If you are up to date with your COVID-19 vaccines, you should: Get tested at least 5 days after your first exposure. A person with COVID-19 is considered infectious starting 2 days before they developed symptoms, or 2 days before the date of their positive test if they do not have symptoms. Get tested again at least 5 days after the end of isolation for the most recently infected person that lives with you. Wear a well-fitting mask when you are around any person with COVID-19 while that person is in isolation. Wear a well-fitting mask around others  for 10 days after the end of isolation for the most recently infected person that lives with you. For example, if the last day of isolation for the person most recently infected with COVID-19 was June 30, the new 10-day period to wear a well-fitting mask indoors in public starts on July 1. Isolate immediately if you develop symptoms of COVID-19 or test positive. I had COVID-19 and completed isolation. Do I have to quarantine or get tested if someone I live with gets COVID-19 shortly after I completed isolation? No. If you recently completed isolation and someone that lives with you tests positive for the virus that causes COVID-19 shortly after the end of your isolation period, you do not have to quarantine or get tested as long as you do not develop new symptoms. Once all of the people that live together have completed isolation or quarantine, refer to the guidance below for new exposures to COVID-19. If you had COVID-19 in the previous 90 days and then came into close contact with someone with COVID-19, you do not have to quarantine or get tested if you do not have symptoms. But you should: Wear a well-fitting mask indoors in public for 10 days after your last close contact. Monitor for COVID-19 symptoms for 10 days from the date of your last close contact. Isolate immediately and get tested if symptoms develop. If more than 90 days have passed since your recovery from infection, follow CDC's recommendations for close contacts. These recommendations will differ depending on your vaccination status. 10/04/2020 Content source: [facility] ([facility]), Division of Viral Diseases This information is not intended to replace advice given to you by your health care provider. Make sure you discuss any questions you have with your health care provider. Document Revised: 02/07/2021 Document Reviewed: 02/07/2021 Elsevier Patient Education  2022 [employer].    If  you have been instructed to have an in-person evaluation today at a local Urgent Care facility, please use the link below. It will take you to a list of all of our available [facility], including address, phone number and hours of operation. Please do not delay care.  [facility]  If you or a family member do not have a primary care provider, use the link below to schedule a visit and establish care. When you choose a [facility] primary care physician or advanced practice provider, you gain a long-term partner in health. Find a Primary Care Provider  Learn more about [facility] in-office and virtual care options: [facility] -  Get Care Now

## 2021-09-11 NOTE — Progress Notes (Signed)
?Virtual Visit Consent  ? ?Jamie Watson, you are scheduled for a virtual visit with a [facility] provider today.   ?  ?Just as with appointments in the office, your consent must be obtained to participate.  Your consent will be active for this visit and any virtual visit you may have with one of our providers in the next 365 days.   ?  ?If you have a MyChart account, a copy of this consent can be sent to you electronically.  All virtual visits are billed to your insurance company just like a traditional visit in the office.   ? ?As this is a virtual visit, video technology does not allow for your provider to perform a traditional examination.  This may limit your provider's ability to fully assess your condition.  If your provider identifies any concerns that need to be evaluated in person or the need to arrange testing (such as labs, EKG, etc.), we will make arrangements to do so.   ?  ?Although advances in technology are sophisticated, we cannot ensure that it will always work on either your end or our end.  If the connection with a video visit is poor, the visit may have to be switched to a telephone visit.  With either a video or telephone visit, we are not always able to ensure that we have a secure connection.    ? ?I need to obtain your verbal consent now.   Are you willing to proceed with your visit today?  ?  ?Jamie Watson has provided verbal consent on 09/11/2021 for a virtual visit (video or telephone). ?  ?Mar Daring, PA-C  ? ?Date: 09/11/2021 1:44 PM ? ? ?Virtual Visit via Video Note  ? ?Jamie Watson, connected with  Jamie Watson  ([id number], September 07, 1970) on 09/11/21 at  1:30 PM EST by a video-enabled telemedicine application and verified that I am speaking with the correct person using two identifiers. ? ?Location: ?Patient: Virtual Visit Location Patient: Home ?Provider: Virtual Visit Location Provider: Home Office ?  ?I discussed the limitations of evaluation and management by  telemedicine and the availability of in person appointments. The patient expressed understanding and agreed to proceed.   ? ?History of Present Illness: ?Jamie Watson is a 51 y.o. who identifies as a female who was assigned female at birth, and is being seen today for Covid 54. ? ?HPI: URI  ?This is a new problem. Episode onset: tested positive for Covid 19 on Sunday morning; Symptoms started Saturday night. The problem has been gradually worsening. The maximum temperature recorded prior to her arrival was 101 - 101.9 F (fluctuating; highest 101). The fever has been present for 1 to 2 days. Associated symptoms include coughing (improving), ear pain (last night), headaches (improving), nausea, sinus pain (first day or two, now improving) and a sore throat (last night). Pertinent negatives include no congestion, rhinorrhea or vomiting. Associated symptoms comments: Muscle cramps, chills. She has tried increased fluids, sleep and NSAIDs (ibuprofen '400mg'$ , Mucinex) for the symptoms. The treatment provided mild relief.   ? ?Requesting refill of BP medication until seen by new PCP. Her PCP left for palliative care. ? ?Problems:  ?Patient Active Problem List  ? Diagnosis Date Noted  ? Acute pain of left knee 04/19/2019  ? Right arm pain 04/19/2019  ?  ?Allergies: No Known Allergies ?Medications:  ?Current Outpatient Medications:  ?  cyclobenzaprine (FLEXERIL) 10 MG tablet, Take 0.5-1 tablets (5-10 mg total) by  mouth 3 (three) times daily as needed for muscle spasms., Disp: 30 tablet, Rfl: 0 ?  ondansetron (ZOFRAN) 4 MG tablet, Take 1 tablet (4 mg total) by mouth every 8 (eight) hours as needed for nausea or vomiting., Disp: 20 tablet, Rfl: 0 ?  amLODipine (NORVASC) 2.5 MG tablet, TAKE 1 TABLET BY MOUTH AT BEDTIME, Disp: 90 tablet, Rfl: 0 ?  amLODipine (NORVASC) 5 MG tablet, Take 1 tablet (5 mg total) by mouth at bedtime., Disp: 90 tablet, Rfl: 0 ?  benzonatate (TESSALON) 100 MG capsule, Take 1 capsule (100 mg total) by  mouth every 8 (eight) hours., Disp: 21 capsule, Rfl: 0 ?  Estradiol 10 MCG TABS vaginal tablet, Insert 1 Tablet as directed  vaginally daily for two weeks then twice a week, Disp: 30 tablet, Rfl: 11 ?  ibuprofen (ADVIL) 400 MG tablet, Take 1 tablet (400 mg total) by mouth every 6 (six) hours as needed., Disp: 12 tablet, Rfl: 0 ?  metoprolol succinate (TOPROL-XL) 25 MG 24 hr tablet, Take 1 tablet (25 mg total) by mouth every morning., Disp: 30 tablet, Rfl: 0 ?  naproxen (NAPROSYN) 500 MG tablet, TAKE 1 TABLET BY MOUTH TWICE DAILY, Disp: 60 tablet, Rfl: 0 ?  predniSONE (DELTASONE) 20 MG tablet, take 2 tablets by mouth daily for 3 days, Disp: 6 tablet, Rfl: 0 ?  valsartan-hydrochlorothiazide (DIOVAN-HCT) 160-12.5 MG tablet, Take 1 tablet by mouth daily., Disp: 90 tablet, Rfl: 0 ? ?Observations/Objective: ?Patient is well-developed, well-nourished in no acute distress.  ?Resting comfortably  at home.  ?Head is normocephalic, atraumatic.  ?No labored breathing.  ?Speech is clear and coherent with logical content.  ?Patient is alert and oriented at baseline.  ? ? ?Assessment and Plan: ?1. COVID-19 ?- ondansetron (ZOFRAN) 4 MG tablet; Take 1 tablet (4 mg total) by mouth every 8 (eight) hours as needed for nausea or vomiting.  Dispense: 20 tablet; Refill: 0 ?- cyclobenzaprine (FLEXERIL) 10 MG tablet; Take 0.5-1 tablets (5-10 mg total) by mouth 3 (three) times daily as needed for muscle spasms.  Dispense: 30 tablet; Refill: 0 ? ?2. Primary hypertension ?- valsartan-hydrochlorothiazide (DIOVAN-HCT) 160-12.5 MG tablet; Take 1 tablet by mouth daily.  Dispense: 90 tablet; Refill: 0 ? ?- Continue OTC symptomatic management of choice ?- Will send OTC vitamins and supplement information through AVS ?- Zofran and Flexeril prescribed ?- Patient enrolled in MyChart symptom monitoring ?- Push fluids ?- Rest as needed ?- Diovan refilled for patient has she is between PCP providers ?- Discussed return precautions and when to seek  in-person evaluation, sent via AVS as well ? ? ?Follow Up Instructions: ?I discussed the assessment and treatment plan with the patient. The patient was provided an opportunity to ask questions and all were answered. The patient agreed with the plan and demonstrated an understanding of the instructions.  A copy of instructions were sent to the patient via MyChart unless otherwise noted below.  ? ?The patient was advised to call back or seek an in-person evaluation if the symptoms worsen or if the condition fails to improve as anticipated. ? ?Time:  ?I spent 16 minutes with the patient via telehealth technology discussing the above problems/concerns.   ? ?Mar Daring, PA-C ?

## 2021-09-15 ENCOUNTER — Other Ambulatory Visit: Payer: Self-pay

## 2021-09-15 ENCOUNTER — Telehealth (HOSPITAL_COMMUNITY): Payer: Self-pay

## 2021-09-15 ENCOUNTER — Ambulatory Visit (HOSPITAL_COMMUNITY)
Admission: EM | Admit: 2021-09-15 | Discharge: 2021-09-15 | Disposition: A | Discharge status: Home / Self Care | Payer: 59 | Attending: Family Medicine | Admitting: Family Medicine

## 2021-09-15 ENCOUNTER — Ambulatory Visit (INDEPENDENT_AMBULATORY_CARE_PROVIDER_SITE_OTHER): Payer: 59

## 2021-09-15 ENCOUNTER — Encounter (HOSPITAL_COMMUNITY): Payer: Self-pay | Admitting: Emergency Medicine

## 2021-09-15 DIAGNOSIS — R509 Fever, unspecified: Secondary | ICD-10-CM | POA: Diagnosis not present

## 2021-09-15 DIAGNOSIS — U071 COVID-19: Secondary | ICD-10-CM | POA: Diagnosis not present

## 2021-09-15 DIAGNOSIS — R059 Cough, unspecified: Secondary | ICD-10-CM | POA: Diagnosis not present

## 2021-09-15 DIAGNOSIS — R051 Acute cough: Secondary | ICD-10-CM

## 2021-09-15 HISTORY — DX: Essential (primary) hypertension: I10

## 2021-09-15 MED ORDER — AZITHROMYCIN 250 MG PO TABS: 250.0000 mg | ORAL_TABLET | Freq: Every day | ORAL | 0 refills | Status: DC

## 2021-09-15 MED ORDER — PROMETHAZINE-DM 6.25-15 MG/5ML PO SYRP: 5.0000 mL | ORAL_SOLUTION | Freq: Four times a day (QID) | ORAL | 0 refills | Status: DC | PRN

## 2021-09-15 NOTE — ED Triage Notes (Signed)
09/08/2021 tested positive.  09/08/2021 is when symptoms started.  Patient has lingering chest congestion, chills, fever 100.9 continues and fatigue, nausea, no appetite.   ?

## 2021-09-15 NOTE — Telephone Encounter (Signed)
Prescriptions verbally gives to the Pharmacist as requested per Pharmacist due to problem with prescriptions crossing over.  ?

## 2021-09-17 NOTE — ED Provider Notes (Signed)
?[facility] ? ? ?[id number] ?09/15/21 Arrival Time: 4098 ? ?ASSESSMENT & PLAN: ? ?1. COVID-19 virus infection   ?2. Acute cough   ?3. Fever, unspecified fever cause   ? ?I have personally viewed the imaging studies ordered this visit. ?Ques subtle haziness over bases. ? ?Given prolonged symptoms and reported temp at home will begin: ? ?Discharge Medication List as of 09/15/2021  3:33 PM  ?  ? ?START taking these medications  ? Details  ?promethazine-dextromethorphan (PROMETHAZINE-DM) 6.25-15 MG/5ML syrup Take 5 mLs by mouth 4 (four) times daily as needed for cough., Starting Sat 09/15/2021, Normal  ?  ?azithromycin (ZITHROMAX) 250 MG tablet Take 1 tablet (250 mg total) by mouth daily. Take first 2 tablets together, then 1 every day until finished., Starting Sat 09/15/2021, Normal  ?  ?  ? ?No resp distress. ? ? Follow-up Information   ? ?  Urgent Care at [facility].   ?Specialty: Urgent Care ?Why: If worsening or failing to improve as anticipated. ?Contact information: ?[address] ?[place] [address] ?[phone] ? ?  ?  ? ?  ?  ? ?  ? ? ?Reviewed expectations re: course of current medical issues. Questions answered. ?Outlined signs and symptoms indicating need for more acute intervention. ?Understanding verbalized. ?After Visit Summary given. ? ? ?SUBJECTIVE: ?History from: Patient. ?Jamie Watson is a 51 y.o. female. Reports: COVID dx over one week ago. Still fatigued. T 100.9 at home today; coughing more. Denies: difficulty breathing. Normal PO intake without n/v/d. ? ?OBJECTIVE: ? ?Vitals:  ? 09/15/21 1435  ?BP: 111/76  ?Pulse: 95  ?Resp: 20  ?Temp: 98.2 ?F (36.8 ?C)  ?TempSrc: Oral  ?SpO2: 100%  ?  ?General appearance: alert; no distress ?Eyes: PERRLA; EOMI; conjunctiva normal ?HENT: [place]; AT; with nasal congestion ?Neck: supple  ?Lungs: speaks full sentences without difficulty; unlabored; coarse breath sounds bilat ?Extremities: no edema ?Skin: warm and  dry ?Neurologic: normal gait ?Psychological: alert and cooperative; normal mood and affect ? ? ?Imaging: ?DG Chest 2 View ? ?Result Date: 09/15/2021 ?CLINICAL DATA:  COVID positive.  Fever, chills and cough. EXAM: CHEST - 2 VIEW COMPARISON:  None. FINDINGS: The cardiomediastinal silhouette is unremarkable. Mild bibasilar opacities are noted which may represent atelectasis or pneumonia. There is no evidence of pulmonary edema, suspicious pulmonary nodule/mass, pleural effusion, or pneumothorax. No acute bony abnormalities are identified. IMPRESSION: Mild bibasilar opacities - question atelectasis versus pneumonia. Electronically Signed   By: Margarette Canada M.D.   On: 09/15/2021 14:52  ? ? ?No Known Allergies ? ?Past Medical History:  ?Diagnosis Date  ? Acute pain of left knee 04/19/2019  ? Chronic headaches   ? Fatigue   ? Hypertension   ? Irregular heart beat   ? Migraine   ? Right arm pain 04/19/2019  ? Seasonal allergies   ? Snoring   ? Weight gain   ? ?Social History  ? ?Socioeconomic History  ? Marital status: Legally Separated  ?  Spouse name: Not on file  ? Number of children: Not on file  ? Years of education: Not on file  ? Highest education level: Not on file  ?Occupational History  ? Not on file  ?Tobacco Use  ? Smoking status: Never  ? Smokeless tobacco: Never  ?Vaping Use  ? Vaping Use: Never used  ?Substance and Sexual Activity  ? Alcohol use: Yes  ? Drug use: No  ? Sexual activity: Not on file  ?Other Topics Concern  ?  Not on file  ?Social History Narrative  ? Not on file  ? ?Social Determinants of Health  ? ?Financial Resource Strain: Not on file  ?Food Insecurity: Not on file  ?Transportation Needs: Not on file  ?Physical Activity: Not on file  ?Stress: Not on file  ?Social Connections: Not on file  ?Intimate Partner Violence: Not on file  ? ?History reviewed. No pertinent family history. ?Past Surgical History:  ?Procedure Laterality Date  ? TONSILLECTOMY    ? ?  ?Vanessa Kick, MD ?09/17/21 [phone] ? ?

## 2021-11-29 ENCOUNTER — Other Ambulatory Visit (HOSPITAL_COMMUNITY): Payer: Self-pay

## 2021-11-29 MED ORDER — OMRON 3 SERIES BP MONITOR DEVI
0 refills | Status: DC
  Filled 2021-11-29: qty 1, 1d supply, fill #0

## 2021-12-24 DIAGNOSIS — L659 Nonscarring hair loss, unspecified: Secondary | ICD-10-CM | POA: Diagnosis not present

## 2021-12-24 DIAGNOSIS — Z01419 Encounter for gynecological examination (general) (routine) without abnormal findings: Secondary | ICD-10-CM | POA: Diagnosis not present

## 2021-12-24 DIAGNOSIS — Z780 Asymptomatic menopausal state: Secondary | ICD-10-CM | POA: Diagnosis not present

## 2021-12-24 DIAGNOSIS — I10 Essential (primary) hypertension: Secondary | ICD-10-CM | POA: Diagnosis not present

## 2021-12-24 DIAGNOSIS — L680 Hirsutism: Secondary | ICD-10-CM | POA: Diagnosis not present

## 2021-12-26 LAB — RESULTS CONSOLE HPV: CHL HPV: NEGATIVE

## 2021-12-26 LAB — HM PAP SMEAR: HM Pap smear: NEGATIVE

## 2022-01-09 ENCOUNTER — Other Ambulatory Visit (HOSPITAL_COMMUNITY): Payer: Self-pay

## 2022-01-09 MED ORDER — AMOXICILLIN 875 MG PO TABS
875.0000 mg | ORAL_TABLET | Freq: Two times a day (BID) | ORAL | 0 refills | Status: DC
  Filled 2022-01-09: qty 14, 7d supply, fill #0

## 2022-01-28 ENCOUNTER — Encounter: Payer: Self-pay | Admitting: Family Medicine

## 2022-01-28 ENCOUNTER — Ambulatory Visit: Payer: 59 | Admitting: Family Medicine

## 2022-01-28 ENCOUNTER — Other Ambulatory Visit (HOSPITAL_COMMUNITY): Payer: Self-pay

## 2022-01-28 VITALS — BP 124/88 | HR 72 | Temp 96.6°F | Ht 66.0 in | Wt 214.6 lb

## 2022-01-28 DIAGNOSIS — Z1211 Encounter for screening for malignant neoplasm of colon: Secondary | ICD-10-CM

## 2022-01-28 DIAGNOSIS — F1290 Cannabis use, unspecified, uncomplicated: Secondary | ICD-10-CM | POA: Insufficient documentation

## 2022-01-28 DIAGNOSIS — Z131 Encounter for screening for diabetes mellitus: Secondary | ICD-10-CM

## 2022-01-28 DIAGNOSIS — I10 Essential (primary) hypertension: Secondary | ICD-10-CM | POA: Insufficient documentation

## 2022-01-28 DIAGNOSIS — Z13220 Encounter for screening for lipoid disorders: Secondary | ICD-10-CM

## 2022-01-28 DIAGNOSIS — Z1159 Encounter for screening for other viral diseases: Secondary | ICD-10-CM | POA: Diagnosis not present

## 2022-01-28 MED ORDER — VALSARTAN-HYDROCHLOROTHIAZIDE 160-12.5 MG PO TABS
1.0000 | ORAL_TABLET | Freq: Every day | ORAL | 3 refills | Status: DC
  Filled 2022-01-28: qty 90, 90d supply, fill #0
  Filled 2022-07-18: qty 90, 90d supply, fill #1
  Filled 2023-01-01: qty 90, 90d supply, fill #2

## 2022-01-28 MED ORDER — AMLODIPINE BESYLATE 5 MG PO TABS
5.0000 mg | ORAL_TABLET | Freq: Every day | ORAL | 3 refills | Status: DC
  Filled 2022-01-28: qty 90, 90d supply, fill #0
  Filled 2022-07-18: qty 90, 90d supply, fill #1
  Filled 2023-01-01: qty 90, 90d supply, fill #2

## 2022-01-28 NOTE — Progress Notes (Signed)
[facility] [address] [place] [address] Dept: [phone] Dept Fax: [phone]  New Patient Office Visit  Subjective:    Patient ID: Jamie Watson, female    DOB: 10/29/1970, 51 y.o..   MRN: [id number]  Chief Complaint  Patient presents with   Establish Care    Np/ Establish Care. Fluctuating hypertension concerns. Under a lot of stress. Interested in amlodipine, previously taking it and had good results.    History of Present Illness:  Patient is in today to establish care. Ms. Haberkorn was born in [place]. She attended some school at [employer], [employer], and [employer], where she got a degree in communications/journalism. 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 She is single and has no children. She quit tobacco about 12 years ago. She only rarely drinks alcohol. She does admit to smoking marijuana multiple times daily. 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  Ms. Oakey has a history of hypertension. she notes that she is currently managed on valsartan-HCTZ 160-12.5 mg daily. She had previously been on amlodipine, but this was stopped by her previous PCP.  Ms. Hoelzer has noted some higher pressures at times, with diastolics > 387.  Past Medical History: Patient Active Problem List   Diagnosis Date Noted   Essential hypertension 01/28/2022   Marijuana use, continuous 01/28/2022   Past Surgical History:  Procedure Laterality Date   TONSILLECTOMY AND ADENOIDECTOMY     Family History  Problem Relation Age of Onset   Heart disease Mother    Atrial fibrillation Mother    Cancer Father        Brain   Diabetes Father    Stroke Maternal Aunt    Cancer Maternal Aunt        Stomach   Diabetes Maternal Uncle    Diabetes Maternal Uncle    Diabetes Maternal Uncle    Diabetes Maternal Grandmother    Stroke Maternal Grandmother     Heart disease Maternal Grandfather    Outpatient Medications Prior to Visit  Medication Sig Dispense Refill   Blood Pressure Monitoring (OMRON 3 SERIES BP MONITOR) DEVI USE AS DIRECTED 1 each 0   valsartan-hydrochlorothiazide (DIOVAN-HCT) 160-12.5 MG tablet Take 1 tablet by mouth daily. 90 tablet 0   amLODipine (NORVASC) 2.5 MG tablet TAKE 1 TABLET BY MOUTH AT BEDTIME 90 tablet 0   amLODipine (NORVASC) 5 MG tablet Take 1 tablet (5 mg total) by mouth at bedtime. (Patient not taking: Reported on 01/28/2022) 90 tablet 0   amoxicillin (AMOXIL) 875 MG tablet Take 1 tablet (875 mg total) by mouth 2 (two) times daily until gone (Patient not taking: Reported on 01/28/2022) 14 tablet 0   azithromycin (ZITHROMAX) 250 MG tablet Take 1 tablet (250 mg total) by mouth daily. Take first 2 tablets together, then 1 every day until finished. (Patient not taking: Reported on 01/28/2022) 6 tablet 0   benzonatate (TESSALON) 100 MG capsule Take 1 capsule (100 mg total) by mouth every 8 (eight) hours. (Patient not taking: Reported on 09/15/2021) 21 capsule 0   cyclobenzaprine (FLEXERIL) 10 MG tablet Take 0.5-1 tablets (5-10 mg total) by mouth 3 (three) times daily as needed for muscle spasms. (Patient not taking: Reported on 09/15/2021) 30 tablet 0   Estradiol 10 MCG TABS vaginal tablet Insert 1 Tablet as directed  vaginally daily for two weeks then twice a week (Patient not taking: Reported  on 01/28/2022) 30 tablet 11   ibuprofen (ADVIL) 400 MG tablet Take 1 tablet (400 mg total) by mouth every 6 (six) hours as needed. (Patient not taking: Reported on 01/28/2022) 12 tablet 0   metoprolol succinate (TOPROL-XL) 25 MG 24 hr tablet Take 1 tablet (25 mg total) by mouth every morning. (Patient not taking: Reported on 01/28/2022) 30 tablet 0   ondansetron (ZOFRAN) 4 MG tablet Take 1 tablet (4 mg total) by mouth every 8 (eight) hours as needed for nausea or vomiting. (Patient not taking: Reported on 01/28/2022) 20 tablet 0   predniSONE  (DELTASONE) 20 MG tablet take 2 tablets by mouth daily for 3 days (Patient not taking: Reported on 09/15/2021) 6 tablet 0   promethazine-dextromethorphan (PROMETHAZINE-DM) 6.25-15 MG/5ML syrup Take 5 mLs by mouth 4 (four) times daily as needed for cough. (Patient not taking: Reported on 01/28/2022) 118 mL 0   No facility-administered medications prior to visit.   No Known Allergies    Objective:   Today's Vitals   01/28/22 1421  BP: 124/88  Pulse: 72  Temp: (!) 96.6 F (35.9 C)  TempSrc: Temporal  SpO2: 99%  Weight: 214 lb 9.6 oz (97.3 kg)  Height: '5\' 6"'$  (1.676 m)   Body mass index is 34.64 kg/m.   General: Well developed, well nourished. No acute distress. CV: RRR without murmurs or rubs. Pulses 2+ bilaterally. Extremities:  No edema noted. Psych: Alert and oriented. Normal mood and affect.  Health Maintenance Due  Topic Date Due   COVID-19 Vaccine (1) Never done   HIV Screening  Never done   Hepatitis C Screening  Never done   TETANUS/TDAP  Never done   PAP SMEAR-Modifier  Never done   COLONOSCOPY (Pts 45-30yr Insurance coverage will need to be confirmed)  09/22/2017   MAMMOGRAM  10/02/2020   Zoster Vaccines- Shingrix (1 of 2) Never done     Assessment & Plan:   1. Essential hypertension Ms. Schneiderman's diastolic blood pressure remains above goal. I will add the amlodipine back to her regimen. I will check a BMP to assess electrolytes and renal function.  - Basic metabolic panel; Future - amLODipine (NORVASC) 5 MG tablet; Take 1 tablet (5 mg total) by mouth at bedtime.  Dispense: 90 tablet; Refill: 3 - valsartan-hydrochlorothiazide (DIOVAN-HCT) 160-12.5 MG tablet; Take 1 tablet by mouth daily.  Dispense: 90 tablet; Refill: 3  2. Screening for diabetes mellitus (DM) Strong family history. I will screen her blood sugar and A1c.  - Basic metabolic panel; Future - Hemoglobin A1c; Future  3. Screening for lipid disorders  - Lipid panel; Future  4. Encounter for  hepatitis C screening test for low risk patient  - HCV Ab w Reflex to Quant PCR; Future  5. Screening for colon cancer  - Ambulatory referral to Gastroenterology  Return in about 3 months (around 04/30/2022) for Reassessment.   SHaydee Salter MD

## 2022-01-30 ENCOUNTER — Encounter: Payer: Self-pay | Admitting: Family Medicine

## 2022-01-31 ENCOUNTER — Other Ambulatory Visit (INDEPENDENT_AMBULATORY_CARE_PROVIDER_SITE_OTHER): Payer: 59

## 2022-01-31 ENCOUNTER — Encounter: Payer: Self-pay | Admitting: Family Medicine

## 2022-01-31 DIAGNOSIS — Z1159 Encounter for screening for other viral diseases: Secondary | ICD-10-CM | POA: Diagnosis not present

## 2022-01-31 DIAGNOSIS — E785 Hyperlipidemia, unspecified: Secondary | ICD-10-CM | POA: Insufficient documentation

## 2022-01-31 DIAGNOSIS — Z13220 Encounter for screening for lipoid disorders: Secondary | ICD-10-CM | POA: Diagnosis not present

## 2022-01-31 DIAGNOSIS — I10 Essential (primary) hypertension: Secondary | ICD-10-CM

## 2022-01-31 DIAGNOSIS — Z131 Encounter for screening for diabetes mellitus: Secondary | ICD-10-CM

## 2022-01-31 DIAGNOSIS — R7303 Prediabetes: Secondary | ICD-10-CM | POA: Insufficient documentation

## 2022-01-31 LAB — LIPID PANEL
Cholesterol: 219 mg/dL — ABNORMAL HIGH (ref 0–200)
HDL: 70.5 mg/dL (ref >39.00)
LDL Cholesterol: 129 mg/dL — ABNORMAL HIGH (ref 0–99)
NonHDL: 148.69
Total CHOL/HDL Ratio: 3
Triglycerides: 100 mg/dL (ref 0.0–149.0)
VLDL: 20 mg/dL (ref 0.0–40.0)

## 2022-01-31 LAB — BASIC METABOLIC PANEL
BUN: 9 mg/dL (ref 6–23)
CO2: 29 mEq/L (ref 19–32)
Calcium: 9.8 mg/dL (ref 8.4–10.5)
Chloride: 101 mEq/L (ref 96–112)
Creatinine, Ser: 0.82 mg/dL (ref 0.40–1.20)
GFR: 82.87 mL/min (ref >60.00)
Glucose, Bld: 103 mg/dL — ABNORMAL HIGH (ref 70–99)
Potassium: 3.6 mEq/L (ref 3.5–5.1)
Sodium: 139 mEq/L (ref 135–145)

## 2022-01-31 LAB — HEMOGLOBIN A1C: Hgb A1c MFr Bld: 6 % (ref 4.6–6.5)

## 2022-02-01 LAB — HCV AB W REFLEX TO QUANT PCR: HCV Ab: NONREACTIVE

## 2022-02-01 LAB — HCV INTERPRETATION

## 2022-03-06 DIAGNOSIS — R7301 Impaired fasting glucose: Secondary | ICD-10-CM | POA: Diagnosis not present

## 2022-03-06 DIAGNOSIS — E785 Hyperlipidemia, unspecified: Secondary | ICD-10-CM | POA: Diagnosis not present

## 2022-03-06 DIAGNOSIS — I10 Essential (primary) hypertension: Secondary | ICD-10-CM | POA: Diagnosis not present

## 2022-03-06 DIAGNOSIS — L680 Hirsutism: Secondary | ICD-10-CM | POA: Diagnosis not present

## 2022-03-14 DIAGNOSIS — R7301 Impaired fasting glucose: Secondary | ICD-10-CM | POA: Diagnosis not present

## 2022-05-06 ENCOUNTER — Telehealth: Payer: Self-pay | Admitting: Family Medicine

## 2022-05-06 ENCOUNTER — Ambulatory Visit: Payer: 59 | Admitting: Family Medicine

## 2022-05-06 NOTE — Telephone Encounter (Signed)
Pt was a no show for an OV with Dr. Gena Fray on 05/06/22, I sent her a letter.

## 2022-05-08 NOTE — Telephone Encounter (Signed)
1st no show, fee waived, letter sent 

## 2022-05-14 ENCOUNTER — Ambulatory Visit: Payer: 59 | Admitting: Family Medicine

## 2022-05-14 ENCOUNTER — Encounter: Payer: Self-pay | Admitting: Family Medicine

## 2022-05-14 VITALS — BP 118/74 | HR 82 | Temp 97.1°F | Ht 66.0 in | Wt 209.2 lb

## 2022-05-14 DIAGNOSIS — I10 Essential (primary) hypertension: Secondary | ICD-10-CM

## 2022-05-14 DIAGNOSIS — R1010 Upper abdominal pain, unspecified: Secondary | ICD-10-CM | POA: Diagnosis not present

## 2022-05-14 NOTE — Progress Notes (Signed)
[facility] [address] [place] [address] Dept: [phone] Dept Fax: [phone]  Chronic Care Office Visit  Subjective:    Patient ID: Jamie Watson, female    DOB: 19-Dec-1970, 51 y.o..   MRN: [id number]  Chief Complaint  Patient presents with   Follow-up    3 month f/u.  C/o having abdominal pain/tightness x 1-2 days.     History of Present Illness:  Patient is in today for reassessment of chronic medical issues.  Ms. Wilmott has a history of hypertension. She is currently managed on valsartan-HCTZ 160-12.5 mg daily and amlodipine 5 mg daily.   Ms. Amir notes that in the past two days, she has been struggling with a tight sensation around her upper abdomen. She has had some periodic sharp pains in both lower rib areas. She denies any associated nausea/vomiting. Her bowel movements have been normal. She has not seen urinary changes.She is not running fever.   Past Medical History: Patient Active Problem List   Diagnosis Date Noted   Borderline hyperlipidemia 01/31/2022   Prediabetes 01/31/2022   Essential hypertension 01/28/2022   Marijuana use, continuous 01/28/2022   Past Surgical History:  Procedure Laterality Date   TONSILLECTOMY AND ADENOIDECTOMY     Family History  Problem Relation Age of Onset   Heart disease Mother    Atrial fibrillation Mother    Cancer Father        Brain   Diabetes Father    Stroke Maternal Aunt    Cancer Maternal Aunt        Stomach   Diabetes Maternal Uncle    Diabetes Maternal Uncle    Diabetes Maternal Uncle    Diabetes Maternal Grandmother    Stroke Maternal Grandmother    Heart disease Maternal Grandfather    Outpatient Medications Prior to Visit  Medication Sig Dispense Refill   amLODipine (NORVASC) 5 MG tablet Take 1 tablet (5 mg total) by mouth at bedtime. 90 tablet 3   Blood Pressure Monitoring (OMRON 3 SERIES BP MONITOR) DEVI USE AS DIRECTED 1 each 0    valsartan-hydrochlorothiazide (DIOVAN-HCT) 160-12.5 MG tablet Take 1 tablet by mouth daily. 90 tablet 3   No facility-administered medications prior to visit.   No Known Allergies    Objective:   Today's Vitals   05/14/22 1427  BP: 118/74  Pulse: 82  Temp: (!) 97.1 F (36.2 C)  TempSrc: Temporal  SpO2: 97%  Weight: 209 lb 3.2 oz (94.9 kg)  Height: '5\' 6"'$  (1.676 m)   Body mass index is 33.77 kg/m.   General: Well developed, well nourished. No acute distress. Lungs: Clear to auscultation bilaterally. No wheezing, rales or rhonchi. CV: RRR without murmurs or rubs. Pulses 2+ bilaterally. Abdomen: Soft. There is mild generalized tenderness. Bowel sounds positive, normal pitch and frequency. No hepatosplenomegaly.   No rebound or guarding. Psych: Alert and oriented. Normal mood and affect.  Health Maintenance Due  Topic Date Due   HIV Screening  Never done   PAP SMEAR-Modifier  Never done   COLONOSCOPY (Pts 45-34yr Insurance coverage will need to be confirmed)  09/22/2017   COVID-19 Vaccine (3 - Pfizer series) 05/24/2020     Assessment & Plan:   1. Essential hypertension Blood pressure is improved now that Ms. Mcvay is back on amlodipine. I will continue her on amlodipine 5 mg dialy and Diovan-HCT 160-12.5 mg daily.  2. Upper abdominal pain The etiology of the abdominal pain is unclear. I  will plan to assess labs and order a CT scan of the abdomen. Follow-up will be based on the results of these studies.  - CBC with Differential/Platelet - Comprehensive metabolic panel - Lipase - Urinalysis w microscopic + reflex cultur - CT Abdomen Pelvis Wo Contrast; Future   Return in about 1 month (around 06/13/2022) for After testing/imaging- 3 months for BP reassessment.   Haydee Salter, MD

## 2022-05-15 ENCOUNTER — Other Ambulatory Visit (INDEPENDENT_AMBULATORY_CARE_PROVIDER_SITE_OTHER): Payer: 59

## 2022-05-15 DIAGNOSIS — R1010 Upper abdominal pain, unspecified: Secondary | ICD-10-CM | POA: Diagnosis not present

## 2022-05-15 LAB — COMPREHENSIVE METABOLIC PANEL
ALT: 28 U/L (ref 0–35)
AST: 17 U/L (ref 0–37)
Albumin: 4.4 g/dL (ref 3.5–5.2)
Alkaline Phosphatase: 58 U/L (ref 39–117)
BUN: 12 mg/dL (ref 6–23)
CO2: 27 mEq/L (ref 19–32)
Calcium: 9.7 mg/dL (ref 8.4–10.5)
Chloride: 105 mEq/L (ref 96–112)
Creatinine, Ser: 0.76 mg/dL (ref 0.40–1.20)
GFR: 90.6 mL/min (ref >60.00)
Glucose, Bld: 83 mg/dL (ref 70–99)
Potassium: 3.7 mEq/L (ref 3.5–5.1)
Sodium: 140 mEq/L (ref 135–145)
Total Bilirubin: 0.5 mg/dL (ref 0.2–1.2)
Total Protein: 6.9 g/dL (ref 6.0–8.3)

## 2022-05-15 LAB — CBC WITH DIFFERENTIAL/PLATELET
Basophils Absolute: 0 10*3/uL (ref 0.0–0.1)
Basophils Relative: 0.9 % (ref 0.0–3.0)
Eosinophils Absolute: 0.3 10*3/uL (ref 0.0–0.7)
Eosinophils Relative: 7.1 % — ABNORMAL HIGH (ref 0.0–5.0)
HCT: 39.9 % (ref 36.0–46.0)
Hemoglobin: 13.3 g/dL (ref 12.0–15.0)
Lymphocytes Relative: 53.8 % — ABNORMAL HIGH (ref 12.0–46.0)
Lymphs Abs: 2.6 10*3/uL (ref 0.7–4.0)
MCHC: 33.3 g/dL (ref 30.0–36.0)
MCV: 90.9 fl (ref 78.0–100.0)
Monocytes Absolute: 0.4 10*3/uL (ref 0.1–1.0)
Monocytes Relative: 8 % (ref 3.0–12.0)
Neutro Abs: 1.5 10*3/uL (ref 1.4–7.7)
Neutrophils Relative %: 30.2 % — ABNORMAL LOW (ref 43.0–77.0)
Platelets: 232 10*3/uL (ref 150.0–400.0)
RBC: 4.39 Mil/uL (ref 3.87–5.11)
RDW: 13.8 % (ref 11.5–15.5)
WBC: 4.9 10*3/uL (ref 4.0–10.5)

## 2022-05-15 LAB — LIPASE: Lipase: 24 U/L (ref 11.0–59.0)

## 2022-05-15 NOTE — Addendum Note (Signed)
Addended by: Lynnea Ferrier on: 05/15/2022 09:30 AM   Modules accepted: Orders

## 2022-05-16 LAB — URINALYSIS W MICROSCOPIC + REFLEX CULTURE
Bacteria, UA: NONE SEEN /HPF
Bilirubin Urine: NEGATIVE
Glucose, UA: NEGATIVE
Hgb urine dipstick: NEGATIVE
Hyaline Cast: NONE SEEN /LPF
Ketones, ur: NEGATIVE
Leukocyte Esterase: NEGATIVE
Nitrites, Initial: NEGATIVE
Protein, ur: NEGATIVE
RBC / HPF: NONE SEEN /HPF (ref 0–2)
Specific Gravity, Urine: 1.012 (ref 1.001–1.035)
Squamous Epithelial / HPF: NONE SEEN /HPF (ref <=5)
WBC, UA: NONE SEEN /HPF (ref 0–5)
pH: 6 (ref 5.0–8.0)

## 2022-05-16 LAB — NO CULTURE INDICATED

## 2022-05-21 DIAGNOSIS — Z1231 Encounter for screening mammogram for malignant neoplasm of breast: Secondary | ICD-10-CM | POA: Diagnosis not present

## 2022-05-24 ENCOUNTER — Encounter: Payer: Self-pay | Admitting: Family Medicine

## 2022-05-28 ENCOUNTER — Telehealth: Payer: Self-pay | Admitting: Family Medicine

## 2022-05-28 NOTE — Telephone Encounter (Signed)
Lft detailed VM that couple attempts have been made by [facility] and left VM to call [phone] to schedule. Dm/cma

## 2022-05-28 NOTE — Telephone Encounter (Signed)
Pt has an order in for a CT Abdomen Pelvis Wo Contrast (Order [id number]) by Dr Gena Fray on 05/14/22. She has not been called yet to schedule an appointment. Is anything  that may be holding this call up. I wasn't sure where to tell her to go. Please advise pt at [phone]

## 2022-07-15 ENCOUNTER — Telehealth: Payer: Self-pay | Admitting: Family Medicine

## 2022-07-15 NOTE — Telephone Encounter (Signed)
Caller Name: pt Call back phone #: [phone]   MEDICATION(S):  valsartan-hydrochlorothiazide (DIOVAN-HCT) 160-12.5 MG tablet , Rx #: [id number]  amLODipine (NORVASC) 5 MG tablet [[id number]]   Days of Med Remaining:   Has the patient contacted their pharmacy (YES/NO)? no What did pharmacy advise?   Preferred Pharmacy:  [facility] [address] [place] [address] Phone: [phone]  Fax: [phone]   ~~~Please advise patient/caregiver to allow 2-3 business days to process RX refills.

## 2022-07-15 NOTE — Telephone Encounter (Signed)
Called patient and advised that 3 refills were sent in for both prescription on 01/28/22.  She will call them and if any problems let us know.  Dm/cma

## 2022-07-18 ENCOUNTER — Other Ambulatory Visit (HOSPITAL_COMMUNITY): Payer: Self-pay

## 2022-07-19 ENCOUNTER — Other Ambulatory Visit (HOSPITAL_COMMUNITY): Payer: Self-pay

## 2022-09-06 ENCOUNTER — Other Ambulatory Visit (HOSPITAL_COMMUNITY): Payer: Self-pay

## 2022-09-06 MED ORDER — CLINDAMYCIN HCL 300 MG PO CAPS
300.0000 mg | ORAL_CAPSULE | Freq: Three times a day (TID) | ORAL | 0 refills | Status: DC
  Filled 2022-09-06: qty 15, 5d supply, fill #0

## 2022-09-07 ENCOUNTER — Other Ambulatory Visit (HOSPITAL_COMMUNITY): Payer: Self-pay

## 2022-10-21 ENCOUNTER — Encounter: Payer: Self-pay | Admitting: *Deleted

## 2022-10-24 DIAGNOSIS — H35033 Hypertensive retinopathy, bilateral: Secondary | ICD-10-CM | POA: Diagnosis not present

## 2022-10-24 DIAGNOSIS — H524 Presbyopia: Secondary | ICD-10-CM | POA: Diagnosis not present

## 2023-01-01 ENCOUNTER — Other Ambulatory Visit: Payer: Self-pay

## 2023-01-01 ENCOUNTER — Other Ambulatory Visit (HOSPITAL_COMMUNITY): Payer: Self-pay

## 2023-01-01 DIAGNOSIS — Z780 Asymptomatic menopausal state: Secondary | ICD-10-CM | POA: Diagnosis not present

## 2023-01-01 DIAGNOSIS — Z01419 Encounter for gynecological examination (general) (routine) without abnormal findings: Secondary | ICD-10-CM | POA: Diagnosis not present

## 2023-01-01 DIAGNOSIS — I10 Essential (primary) hypertension: Secondary | ICD-10-CM | POA: Diagnosis not present

## 2023-03-07 ENCOUNTER — Ambulatory Visit: Payer: Commercial Managed Care - PPO | Admitting: Family Medicine

## 2023-03-07 ENCOUNTER — Telehealth: Payer: Self-pay | Admitting: Family Medicine

## 2023-03-07 NOTE — Telephone Encounter (Signed)
 NS no reason letter sent

## 2023-03-12 ENCOUNTER — Encounter: Payer: Self-pay | Admitting: Family Medicine

## 2023-03-12 NOTE — Telephone Encounter (Signed)
 2nd no show, final warning letter sent via mail and mychart to reschedule/notify of policy

## 2023-03-26 ENCOUNTER — Emergency Department (HOSPITAL_BASED_OUTPATIENT_CLINIC_OR_DEPARTMENT_OTHER): Payer: Commercial Managed Care - PPO

## 2023-03-26 ENCOUNTER — Encounter (HOSPITAL_BASED_OUTPATIENT_CLINIC_OR_DEPARTMENT_OTHER): Payer: Self-pay

## 2023-03-26 DIAGNOSIS — Z1152 Encounter for screening for COVID-19: Secondary | ICD-10-CM

## 2023-03-26 DIAGNOSIS — Z6832 Body mass index (BMI) 32.0-32.9, adult: Secondary | ICD-10-CM

## 2023-03-26 DIAGNOSIS — Z79899 Other long term (current) drug therapy: Secondary | ICD-10-CM

## 2023-03-26 DIAGNOSIS — G35 Multiple sclerosis: Secondary | ICD-10-CM | POA: Diagnosis not present

## 2023-03-26 DIAGNOSIS — Z823 Family history of stroke: Secondary | ICD-10-CM

## 2023-03-26 DIAGNOSIS — Z87891 Personal history of nicotine dependence: Secondary | ICD-10-CM

## 2023-03-26 DIAGNOSIS — Z8269 Family history of other diseases of the musculoskeletal system and connective tissue: Secondary | ICD-10-CM

## 2023-03-26 DIAGNOSIS — R739 Hyperglycemia, unspecified: Secondary | ICD-10-CM | POA: Diagnosis not present

## 2023-03-26 DIAGNOSIS — R42 Dizziness and giddiness: Secondary | ICD-10-CM | POA: Diagnosis not present

## 2023-03-26 DIAGNOSIS — Z833 Family history of diabetes mellitus: Secondary | ICD-10-CM

## 2023-03-26 DIAGNOSIS — I10 Essential (primary) hypertension: Secondary | ICD-10-CM | POA: Diagnosis present

## 2023-03-26 DIAGNOSIS — T502X5A Adverse effect of carbonic-anhydrase inhibitors, benzothiadiazides and other diuretics, initial encounter: Secondary | ICD-10-CM | POA: Diagnosis present

## 2023-03-26 DIAGNOSIS — E876 Hypokalemia: Secondary | ICD-10-CM | POA: Diagnosis present

## 2023-03-26 DIAGNOSIS — E669 Obesity, unspecified: Secondary | ICD-10-CM | POA: Diagnosis present

## 2023-03-26 DIAGNOSIS — T380X5A Adverse effect of glucocorticoids and synthetic analogues, initial encounter: Secondary | ICD-10-CM | POA: Diagnosis not present

## 2023-03-26 DIAGNOSIS — R5381 Other malaise: Secondary | ICD-10-CM | POA: Diagnosis present

## 2023-03-26 DIAGNOSIS — Z8249 Family history of ischemic heart disease and other diseases of the circulatory system: Secondary | ICD-10-CM

## 2023-03-26 DIAGNOSIS — R200 Anesthesia of skin: Secondary | ICD-10-CM | POA: Diagnosis not present

## 2023-03-26 DIAGNOSIS — Y92239 Unspecified place in hospital as the place of occurrence of the external cause: Secondary | ICD-10-CM | POA: Diagnosis not present

## 2023-03-26 LAB — COMPREHENSIVE METABOLIC PANEL
ALT: 30 U/L (ref 0–44)
AST: 30 U/L (ref 15–41)
Albumin: 4.4 g/dL (ref 3.5–5.0)
Alkaline Phosphatase: 57 U/L (ref 38–126)
Anion gap: 10 (ref 5–15)
BUN: 11 mg/dL (ref 6–20)
CO2: 25 mmol/L (ref 22–32)
Calcium: 9.4 mg/dL (ref 8.9–10.3)
Chloride: 104 mmol/L (ref 98–111)
Creatinine, Ser: 0.85 mg/dL (ref 0.44–1.00)
GFR, Estimated: >60 mL/min (ref >60)
Glucose, Bld: 105 mg/dL — ABNORMAL HIGH (ref 70–99)
Potassium: 3.3 mmol/L — ABNORMAL LOW (ref 3.5–5.1)
Sodium: 139 mmol/L (ref 135–145)
Total Bilirubin: 0.8 mg/dL (ref 0.3–1.2)
Total Protein: 7.7 g/dL (ref 6.5–8.1)

## 2023-03-26 LAB — CBC WITH DIFFERENTIAL/PLATELET
Abs Immature Granulocytes: 0.01 10*3/uL (ref 0.00–0.07)
Basophils Absolute: 0 10*3/uL (ref 0.0–0.1)
Basophils Relative: 1 %
Eosinophils Absolute: 0.1 10*3/uL (ref 0.0–0.5)
Eosinophils Relative: 2 %
HCT: 40.2 % (ref 36.0–46.0)
Hemoglobin: 13.5 g/dL (ref 12.0–15.0)
Immature Granulocytes: 0 %
Lymphocytes Relative: 52 %
Lymphs Abs: 2.5 10*3/uL (ref 0.7–4.0)
MCH: 30.1 pg (ref 26.0–34.0)
MCHC: 33.6 g/dL (ref 30.0–36.0)
MCV: 89.7 fL (ref 80.0–100.0)
Monocytes Absolute: 0.3 10*3/uL (ref 0.1–1.0)
Monocytes Relative: 6 %
Neutro Abs: 1.8 10*3/uL (ref 1.7–7.7)
Neutrophils Relative %: 39 %
Platelets: 257 10*3/uL (ref 150–400)
RBC: 4.48 MIL/uL (ref 3.87–5.11)
RDW: 12.8 % (ref 11.5–15.5)
WBC: 4.7 10*3/uL (ref 4.0–10.5)
nRBC: 0 % (ref 0.0–0.2)

## 2023-03-26 LAB — CBG MONITORING, ED: Glucose-Capillary: 121 mg/dL — ABNORMAL HIGH (ref 70–99)

## 2023-03-26 NOTE — ED Triage Notes (Addendum)
Pt states she has not been feeling well x 2-3 days Experienced some dizziness and states that her  hands have felt numb and rt. Leg seems to be "staggering" Had dental procedure today and received a lot of Lidocaine and states she has not been able to use her hands "correctly" Arms and legs strength are equal Smile symmetrical  Alert and oriented CBG 121 in traige

## 2023-03-27 ENCOUNTER — Emergency Department (HOSPITAL_COMMUNITY): Payer: Commercial Managed Care - PPO

## 2023-03-27 ENCOUNTER — Inpatient Hospital Stay (HOSPITAL_BASED_OUTPATIENT_CLINIC_OR_DEPARTMENT_OTHER)
Admission: EM | Admit: 2023-03-27 | Discharge: 2023-03-31 | DRG: 060 | Disposition: A | Discharge status: Home Health | Payer: Commercial Managed Care - PPO | Attending: Internal Medicine | Admitting: Internal Medicine

## 2023-03-27 DIAGNOSIS — R42 Dizziness and giddiness: Secondary | ICD-10-CM | POA: Diagnosis not present

## 2023-03-27 DIAGNOSIS — Z833 Family history of diabetes mellitus: Secondary | ICD-10-CM | POA: Diagnosis not present

## 2023-03-27 DIAGNOSIS — Z8249 Family history of ischemic heart disease and other diseases of the circulatory system: Secondary | ICD-10-CM | POA: Diagnosis not present

## 2023-03-27 DIAGNOSIS — R531 Weakness: Secondary | ICD-10-CM

## 2023-03-27 DIAGNOSIS — T502X5A Adverse effect of carbonic-anhydrase inhibitors, benzothiadiazides and other diuretics, initial encounter: Secondary | ICD-10-CM | POA: Diagnosis present

## 2023-03-27 DIAGNOSIS — Z1152 Encounter for screening for COVID-19: Secondary | ICD-10-CM | POA: Diagnosis not present

## 2023-03-27 DIAGNOSIS — R9082 White matter disease, unspecified: Secondary | ICD-10-CM | POA: Diagnosis not present

## 2023-03-27 DIAGNOSIS — G35 Multiple sclerosis: Secondary | ICD-10-CM | POA: Diagnosis not present

## 2023-03-27 DIAGNOSIS — Z87891 Personal history of nicotine dependence: Secondary | ICD-10-CM | POA: Diagnosis not present

## 2023-03-27 DIAGNOSIS — T380X5A Adverse effect of glucocorticoids and synthetic analogues, initial encounter: Secondary | ICD-10-CM | POA: Diagnosis not present

## 2023-03-27 DIAGNOSIS — Z823 Family history of stroke: Secondary | ICD-10-CM | POA: Diagnosis not present

## 2023-03-27 DIAGNOSIS — Z6832 Body mass index (BMI) 32.0-32.9, adult: Secondary | ICD-10-CM | POA: Diagnosis not present

## 2023-03-27 DIAGNOSIS — I10 Essential (primary) hypertension: Secondary | ICD-10-CM | POA: Diagnosis present

## 2023-03-27 DIAGNOSIS — Z8269 Family history of other diseases of the musculoskeletal system and connective tissue: Secondary | ICD-10-CM | POA: Diagnosis not present

## 2023-03-27 DIAGNOSIS — E876 Hypokalemia: Secondary | ICD-10-CM | POA: Diagnosis present

## 2023-03-27 DIAGNOSIS — Y92239 Unspecified place in hospital as the place of occurrence of the external cause: Secondary | ICD-10-CM | POA: Diagnosis not present

## 2023-03-27 DIAGNOSIS — R5381 Other malaise: Secondary | ICD-10-CM | POA: Diagnosis present

## 2023-03-27 DIAGNOSIS — G35A Relapsing-remitting multiple sclerosis: Secondary | ICD-10-CM | POA: Diagnosis present

## 2023-03-27 DIAGNOSIS — Z79899 Other long term (current) drug therapy: Secondary | ICD-10-CM | POA: Diagnosis not present

## 2023-03-27 DIAGNOSIS — E669 Obesity, unspecified: Secondary | ICD-10-CM | POA: Diagnosis present

## 2023-03-27 DIAGNOSIS — R739 Hyperglycemia, unspecified: Secondary | ICD-10-CM | POA: Diagnosis not present

## 2023-03-27 DIAGNOSIS — R200 Anesthesia of skin: Secondary | ICD-10-CM | POA: Diagnosis not present

## 2023-03-27 LAB — PROTIME-INR
INR: 1 (ref 0.8–1.2)
Prothrombin Time: 13 seconds (ref 11.4–15.2)

## 2023-03-27 LAB — URINALYSIS, MICROSCOPIC (REFLEX)

## 2023-03-27 LAB — URINALYSIS, ROUTINE W REFLEX MICROSCOPIC
Bilirubin Urine: NEGATIVE
Glucose, UA: NEGATIVE mg/dL
Ketones, ur: NEGATIVE mg/dL
Leukocytes,Ua: NEGATIVE
Nitrite: NEGATIVE
Protein, ur: NEGATIVE mg/dL
Specific Gravity, Urine: 1.025 (ref 1.005–1.030)
pH: 7 (ref 5.0–8.0)

## 2023-03-27 LAB — RAPID URINE DRUG SCREEN, HOSP PERFORMED
Amphetamines: NOT DETECTED
Barbiturates: NOT DETECTED
Benzodiazepines: NOT DETECTED
Cocaine: NOT DETECTED
Opiates: NOT DETECTED
Tetrahydrocannabinol: POSITIVE — AB

## 2023-03-27 LAB — HIV ANTIBODY (ROUTINE TESTING W REFLEX): HIV Screen 4th Generation wRfx: NONREACTIVE

## 2023-03-27 LAB — ETHANOL: Alcohol, Ethyl (B): <10 mg/dL (ref <10)

## 2023-03-27 LAB — PREGNANCY, URINE: Preg Test, Ur: NEGATIVE

## 2023-03-27 LAB — APTT: aPTT: 31 seconds (ref 24–36)

## 2023-03-27 LAB — SARS CORONAVIRUS 2 BY RT PCR: SARS Coronavirus 2 by RT PCR: NEGATIVE

## 2023-03-27 MED ORDER — IRBESARTAN 300 MG PO TABS
150.0000 mg | ORAL_TABLET | Freq: Every day | ORAL | Status: DC
  Administered 2023-03-27 – 2023-03-31 (×5): 150 mg via ORAL
  Filled 2023-03-27 (×5): qty 1

## 2023-03-27 MED ORDER — SODIUM CHLORIDE 0.9% FLUSH
3.0000 mL | Freq: Two times a day (BID) | INTRAVENOUS | Status: DC
  Administered 2023-03-27 – 2023-03-31 (×8): 3 mL via INTRAVENOUS

## 2023-03-27 MED ORDER — ACETAMINOPHEN 325 MG PO TABS
650.0000 mg | ORAL_TABLET | Freq: Four times a day (QID) | ORAL | Status: DC | PRN
  Administered 2023-03-27 – 2023-03-30 (×5): 650 mg via ORAL
  Filled 2023-03-27 (×5): qty 2

## 2023-03-27 MED ORDER — ONDANSETRON HCL 4 MG/2ML IJ SOLN: 4.0000 mg | Freq: Four times a day (QID) | INTRAMUSCULAR | Status: DC | PRN

## 2023-03-27 MED ORDER — HYDROCHLOROTHIAZIDE 12.5 MG PO TABS
12.5000 mg | ORAL_TABLET | Freq: Every day | ORAL | Status: DC
  Administered 2023-03-27 – 2023-03-31 (×5): 12.5 mg via ORAL
  Filled 2023-03-27 (×5): qty 1

## 2023-03-27 MED ORDER — AMLODIPINE BESYLATE 5 MG PO TABS
5.0000 mg | ORAL_TABLET | Freq: Every day | ORAL | Status: DC
  Administered 2023-03-27 – 2023-03-30 (×4): 5 mg via ORAL
  Filled 2023-03-27 (×4): qty 1

## 2023-03-27 MED ORDER — VALSARTAN-HYDROCHLOROTHIAZIDE 160-12.5 MG PO TABS: 1.0000 | ORAL_TABLET | Freq: Every day | ORAL | Status: DC

## 2023-03-27 MED ORDER — GADOBUTROL 1 MMOL/ML IV SOLN
9.0000 mL | Freq: Once | INTRAVENOUS | Status: AC | PRN
  Administered 2023-03-27: 9 mL via INTRAVENOUS

## 2023-03-27 MED ORDER — ONDANSETRON HCL 4 MG PO TABS: 4.0000 mg | ORAL_TABLET | Freq: Four times a day (QID) | ORAL | Status: DC | PRN

## 2023-03-27 MED ORDER — SODIUM CHLORIDE 0.9 % IV SOLN
1000.0000 mg | Freq: Every day | INTRAVENOUS | Status: AC
  Administered 2023-03-27 – 2023-03-31 (×5): 1000 mg via INTRAVENOUS
  Filled 2023-03-27 (×6): qty 16

## 2023-03-27 MED ORDER — ACETAMINOPHEN 650 MG RE SUPP: 650.0000 mg | Freq: Four times a day (QID) | RECTAL | Status: DC | PRN

## 2023-03-27 MED ORDER — LORAZEPAM 2 MG/ML IJ SOLN
1.0000 mg | Freq: Once | INTRAMUSCULAR | Status: AC
  Administered 2023-03-27: 1 mg via INTRAVENOUS
  Filled 2023-03-27: qty 1

## 2023-03-27 MED ORDER — PANTOPRAZOLE SODIUM 40 MG PO TBEC
40.0000 mg | DELAYED_RELEASE_TABLET | Freq: Every day | ORAL | Status: DC
  Administered 2023-03-27 – 2023-03-31 (×5): 40 mg via ORAL
  Filled 2023-03-27 (×5): qty 1

## 2023-03-27 MED ORDER — ALBUTEROL SULFATE (2.5 MG/3ML) 0.083% IN NEBU: 2.5000 mg | INHALATION_SOLUTION | Freq: Four times a day (QID) | RESPIRATORY_TRACT | Status: DC | PRN

## 2023-03-27 MED ORDER — ENOXAPARIN SODIUM 40 MG/0.4ML IJ SOSY
40.0000 mg | PREFILLED_SYRINGE | INTRAMUSCULAR | Status: DC
  Administered 2023-03-27 – 2023-03-30 (×4): 40 mg via SUBCUTANEOUS
  Filled 2023-03-27 (×4): qty 0.4

## 2023-03-27 MED ORDER — HYDRALAZINE HCL 20 MG/ML IJ SOLN: 10.0000 mg | INTRAMUSCULAR | Status: DC | PRN

## 2023-03-27 MED ORDER — LORAZEPAM 2 MG/ML IJ SOLN
2.0000 mg | Freq: Once | INTRAMUSCULAR | Status: AC | PRN
  Administered 2023-03-27: 2 mg via INTRAVENOUS
  Filled 2023-03-27: qty 1

## 2023-03-27 MED ORDER — ACETAMINOPHEN 500 MG PO TABS
1000.0000 mg | ORAL_TABLET | Freq: Once | ORAL | Status: AC
  Administered 2023-03-27: 1000 mg via ORAL
  Filled 2023-03-27: qty 2

## 2023-03-27 MED ORDER — POTASSIUM CHLORIDE CRYS ER 20 MEQ PO TBCR
40.0000 meq | EXTENDED_RELEASE_TABLET | ORAL | Status: AC
  Administered 2023-03-27: 40 meq via ORAL
  Filled 2023-03-27: qty 2

## 2023-03-27 NOTE — ED Notes (Signed)
Patient here as transfer for MRI.  Patient endorses bilateral hand numbness and problems with her right leg x 3 days.  Patient has IV that was secured for POV travel.

## 2023-03-27 NOTE — ED Notes (Signed)
Patient transported to MRI 

## 2023-03-27 NOTE — Consult Note (Signed)
PCP:   Loyola Mast, MD   Chief Complaint:  Right-sided weakness.  HPI: This is a 52 year old female with past medical history of hypertension and chronic headaches.  Patient presents to the ER with complaint of 3 days of dizziness.  She denies of consciousness or falling.  Yesterday her right hand locked up to the point she could not write.  Per patient this persist.  She endorses right upper extremity weakness but no numbness.  Her right leg addition developed some weakness.  She denies headache, slurred speech, nausea, vomiting.  She does have a family history of lupus aunts on her mom's eyes.  She states she has never had this before.  In the ER patient vitals were stable.  CT head without contrast was normal.  MRI brain without contrast is suggestive of multiple sclerosis.  Neurosurgery consulted.  Review of Systems:  Per HPI  Past Medical History: Past Medical History:  Diagnosis Date   Acute pain of left knee 04/19/2019   Chronic headaches    Fatigue    Hypertension    Irregular heart beat    Menopause    Migraine    Right arm pain 04/19/2019   Seasonal allergies    Snoring    Weight gain    Past Surgical History:  Procedure Laterality Date   TONSILLECTOMY AND ADENOIDECTOMY      Medications: Prior to Admission medications   Medication Sig Start Date End Date Taking? Authorizing Provider  amLODipine (NORVASC) 5 MG tablet Take 1 tablet (5 mg total) by mouth at bedtime. 01/28/22   Loyola Mast, MD  Blood Pressure Monitoring (OMRON 3 SERIES BP MONITOR) DEVI USE AS DIRECTED 11/29/21     clindamycin (CLEOCIN) 300 MG capsule Take 1 capsule (300 mg total) by mouth 3 (three) times daily until gone 09/06/22     valsartan-hydrochlorothiazide (DIOVAN-HCT) 160-12.5 MG tablet Take 1 tablet by mouth daily. 01/28/22   Loyola Mast, MD    Allergies:  No Known Allergies  Social History:  reports that she quit smoking about 12 years ago. Her smoking use included cigarettes. She  has never used smokeless tobacco. She reports current alcohol use. She reports current drug use. Drug: Marijuana.  Family History: Family History  Problem Relation Age of Onset   Heart disease Mother    Atrial fibrillation Mother    Cancer Father        Brain   Diabetes Father    Stroke Maternal Aunt    Cancer Maternal Aunt        Stomach   Diabetes Maternal Uncle    Diabetes Maternal Uncle    Diabetes Maternal Uncle    Diabetes Maternal Grandmother    Stroke Maternal Grandmother    Heart disease Maternal Grandfather     Physical Exam: Vitals:   03/26/23 2220 03/27/23 0128 03/27/23 0213 03/27/23 0310  BP:  (!) 144/101 (!) 133/92 (!) 155/98  Pulse:  71 81 80  Resp:  18 13 18   Temp:   98.8 F (37.1 C) 98.1 F (36.7 C)  TempSrc:   Oral Oral  SpO2:  100% 98% 97%  Weight: 92.1 kg     Height: 5\' 6"  (1.676 m)       General: A and O x 3, well developed and nourished, no acute distress Eyes: Pnk conjunctiva, no scleral icterus ENT: Moist oral mucosa, neck supple, no thyromegaly Lungs: CTA B/L, no wheeze, no crackles, no use of accessory muscles Cardiovascular: RRR, no regurgitation,  no gallops, no murmurs.  Abdomen: soft, positive BS, NTND, no organomegaly, not an acute abdomen GU: not examined Neuro: CN II - XII grossly intact, sensation intact Musculoskeletal: Strength left-sided 5/5 LUE/LLE, RUE/RLE 4.5/5, no edema Skin: no rash, no subcutaneous crepitation, no decubitus Psych: appropriate patient   Labs on Admission:  Recent Labs    03/26/23 2223  NA 139  K 3.3*  CL 104  CO2 25  GLUCOSE 105*  BUN 11  CREATININE 0.85  CALCIUM 9.4   Recent Labs    03/26/23 2223  AST 30  ALT 30  ALKPHOS 57  BILITOT 0.8  PROT 7.7  ALBUMIN 4.4    Recent Labs    03/26/23 2223  WBC 4.7  NEUTROABS 1.8  HGB 13.5  HCT 40.2  MCV 89.7  PLT 257    Micro Results: Recent Results (from the past 240 hour(s))  SARS Coronavirus 2 by RT PCR (hospital order, performed in  [facility] hospital lab) *cepheid single result test* Anterior Nasal Swab     Status: None   Collection Time: 03/26/23 11:45 PM   Specimen: Anterior Nasal Swab  Result Value Ref Range Status   SARS Coronavirus 2 by RT PCR NEGATIVE NEGATIVE Final    Comment: (NOTE) SARS-CoV-2 target nucleic acids are NOT DETECTED.  The SARS-CoV-2 RNA is generally detectable in upper and lower respiratory specimens during the acute phase of infection. The lowest concentration of SARS-CoV-2 viral copies this assay can detect is 250 copies / mL. A negative result does not preclude SARS-CoV-2 infection and should not be used as the sole basis for treatment or other patient management decisions.  A negative result may occur with improper specimen collection / handling, submission of specimen other than nasopharyngeal swab, presence of viral mutation(s) within the areas targeted by this assay, and inadequate number of viral copies (<250 copies / mL). A negative result must be combined with clinical observations, patient history, and epidemiological information.  Fact Sheet for Patients:   RoadLapTop.co.za  Fact Sheet for Healthcare Providers: http://kim-miller.com/  This test is not yet approved or  cleared by the [place] FDA and has been authorized for detection and/or diagnosis of SARS-CoV-2 by FDA under an Emergency Use Authorization (EUA).  This EUA will remain in effect (meaning this test can be used) for the duration of the COVID-19 declaration under Section 564(b)(1) of the Act, 21 U.S.C. section 360bbb-3(b)(1), unless the authorization is terminated or revoked sooner.  Performed at [facility], [address] [place], [place] [address]      Radiological Exams on Admission: MR BRAIN WO CONTRAST  Result Date: 03/27/2023 CLINICAL DATA:  52 year old female with dizziness, bilateral hand numbness, neurologic deficit. Right leg  symptoms for 3 days. EXAM: MRI HEAD WITHOUT CONTRAST TECHNIQUE: Multiplanar, multiecho pulse sequences of the brain and surrounding structures were obtained without intravenous contrast. COMPARISON:  Head CT last night. FINDINGS: Brain: Normal cerebral volume. No restricted diffusion to suggest acute infarction. No midline shift, mass effect, evidence of mass lesion, ventriculomegaly, extra-axial collection or acute intracranial hemorrhage. Cervicomedullary junction and pituitary are within normal limits. But there are bilateral scattered, nodular foci of T2 and FLAIR hyperintensity with facilitated diffusion in the periventricular white matter (series 7, image 35) and some of these appear oriented perpendicular to the ventricles. There is additional involvement of the left corona radiata and external capsule on that side. Conspicuous bilateral temporal lobe periventricular involvement (on the right series 7, image 21). Deep gray  nuclei, brainstem, and cerebellum relatively spared. None of the foci are restricted. No cortical involvement or encephalomalacia identified. No chronic cerebral blood products on SWI. Vascular: Major intracranial vascular flow voids are preserved. Skull and upper cervical spine: Negative visible cervical spine. Normal bone marrow signal. Sinuses/Orbits: Orbit motion artifact, grossly normal. Paranasal sinuses and mastoids are stable and well aerated. Other: Grossly negative visible internal auditory structures. Negative visible scalp and face. IMPRESSION: Bilateral, nodular T2/FLAIR hyperintense cerebral white matter lesions in a configuration strongly suggesting Multiple Sclerosis. No evidence of active demyelination on this noncontrast exam. Electronically Signed   By: Odessa Fleming M.D.   On: 03/27/2023 04:19   CT HEAD WO CONTRAST ( )  Result Date: 03/26/2023 CLINICAL DATA:  Dizziness and bilateral hand numbness EXAM: CT HEAD WITHOUT CONTRAST TECHNIQUE: Contiguous axial images were  obtained from the base of the skull through the vertex without intravenous contrast. RADIATION DOSE REDUCTION: This exam was performed according to the departmental dose-optimization program which includes automated exposure control, adjustment of the mA and/or kV according to patient size and/or use of iterative reconstruction technique. COMPARISON:  None Available. FINDINGS: Brain: No evidence of acute infarction, hemorrhage, mass, mass effect, or midline shift. No hydrocephalus or extra-axial fluid collection. Partial empty sella. Normal craniocervical junction. Vascular: No hyperdense vessel. Skull: Negative for fracture or focal lesion. Sinuses/Orbits: The imaged paranasal sinuses are clear. Small osteoma in the left ethmoid air cells. No acute finding in the orbits. Other: The mastoid air cells are well aerated. IMPRESSION: No acute intracranial process. Electronically Signed   By: Wiliam Ke M.D.   On: 03/26/2023 23:29    Assessment/Plan Present on Admission:  Possible multiple sclerosis -new diagnosis -Per EDP neurologist Dr. Otelia Limes request MRI brain with contrast and MRI cervical spine with and without contrast.  He recommends steroids until these are resulted and positive.  Neurology will see patient in a.m. -Patient request MRI's and neurology consult to be done.  She would like to discuss with neurologist the possibility of inpatient versus outpatient treatment.  If possible she would prefer to opt for outpatient treatment.  However, she appears reasonable and seems as though should be amenable to admission if that is the strong request of the neurologist team at that point.  Essential hypertension -Patient's Norvasc and valsartan will be resumed  Addeline Calarco 03/27/2023, 6:13 AM

## 2023-03-27 NOTE — ED Provider Notes (Addendum)
Patient was received in transfer from med [facility] emergency department please see previous providers note for full detail  In short patient with medical history including migraines, chronic headaches, hypertension, presenting with complaints of decreased sensation in her left hand and decreased motor function of the right arm.  States it happened about 3 days ago, states that she was washing dishes and while son could not feel her left hand, she states then today she was unable to grip a pencil and write out her name, states that she feels weakness in her right arm as well as occasional weakness in her left leg, currently no headaches change in vision, is never had this in the past.   Physical Exam  BP (!) 155/98 (BP Location: Right Arm)   Pulse 80   Temp 98.1 F (36.7 C) (Oral)   Resp 18   Ht 5\' 6"  (1.676 m)   Wt 92.1 kg   SpO2 97%   BMI 32.77 kg/m   Physical Exam Vitals and nursing note reviewed.  Constitutional:      General: She is not in acute distress.    Appearance: She is not ill-appearing.  HENT:     Head: Normocephalic and atraumatic.     Nose: No congestion.  Eyes:     Conjunctiva/sclera: Conjunctivae normal.  Cardiovascular:     Rate and Rhythm: Normal rate and regular rhythm.     Pulses: Normal pulses.  Pulmonary:     Effort: Pulmonary effort is normal.  Skin:    General: Skin is warm and dry.  Neurological:     Mental Status: She is alert.     GCS: GCS eye subscore is 4. GCS verbal subscore is 5. GCS motor subscore is 6.     Cranial Nerves: Cranial nerves 2-12 are intact.     Sensory: Sensation is intact.     Motor: Weakness present.     Comments: Cranial nerves II through XII grossly intact, no slurring of her words, can follow two-step commands, notable decrease grip strength in the right hand as well as decreased strength in the right arm, 4 out of 5 in the right arm versus 5-5 in the left.  Sensation grossly intact  Psychiatric:        Mood and  Affect: Mood normal.     Procedures  Procedures  ED Course / MDM    Medical Decision Making Amount and/or Complexity of Data Reviewed Labs: ordered. Radiology: ordered.  Risk OTC drugs. Prescription drug management. Decision regarding hospitalization.    Lab Tests:  I Ordered, and personally interpreted labs.  The pertinent results include: CBC is unremarkable, CMP reveals potassium 3.3, glucose 105, UA is unremarkable, urine drug screen positive for cannabis, prothrombin time INR unremarkable APTT unremarkable, urine pregnancy negative, ethanol less than 10   Imaging Studies ordered:  I ordered imaging studies including CT head, MRI brain I independently visualized and interpreted imaging which showed CT head negative, MRI reveals likely MS without active demyelinization I agree with the radiologist interpretation   Cardiac Monitoring:  The patient was maintained on a cardiac monitor.  I personally viewed and interpreted the cardiac monitored which showed an underlying rhythm of: Sinus not signs of ischemia   Medicines ordered and prescription drug management:  I ordered medication including n/a I have reviewed the patients home medicines and have made adjustments as needed  Critical Interventions:  N/A   Reevaluation:  Presenting with weakness, MRI reveals MS without evidence of active  demyelinization, she is notably weak on her right arm, will consult with neurology for further recommendations.  Updated patient these recommendations, she is agreement with admission will consult with [facility] for further management   Consultations Obtained:  I requested consultation with the Dr. Otelia Limes of neurology,  and discussed lab and imaging findings as well as pertinent plan - they recommend: Medical admission, obtain MRI brain with contrast as well as MRI C-spine with and without contrast, hold steroids at this time until further MRIs have been obtained, his team will  round on the patient. Spoke with Dr. Joneen Roach who will admit the patient.    Test Considered:  N/a  Rule out low suspicion for internal head bleed and or mass as CT imaging is negative for acute findings.  Low suspicion for CVA as mri is negative for evidence of ischemia.  She does have noted right arm weakness but this is likely explained by possible MS.  Low suspicion for dissection of the vertebral or carotid artery as presentation atypical of etiology.  Low suspicion for meningitis as she has no meningeal sign present.   Dispostion and problem list  After consideration of the diagnostic results and the patients response to treatment, I feel that the patent would benefit from admission.  Right arm weakness-MRI brain without contrast shows concerns of possible MS, possible this is the cause of her symptoms, will undergo further MRI as well as formal evaluation by neurology.   Addendum-Dr. Joneen Roach of the admitting team evaluated the patient, patient does not want to be admitted just yet, she like to have her MRI and speak with neurology before she makes her final decision.  [facility] will continue to follow, will admit patient if patient agrees with neurologist recommendations, would recommend having patient sign out AMA if she attempts to leave prior to formal evaluation by neurology.    [facility] was made aware and will follow.      Carroll Sage, PA-C 03/27/23 0605    Carroll Sage, PA-C 03/27/23 1610    Shon Baton, MD 03/27/23 [phone]

## 2023-03-27 NOTE — ED Notes (Signed)
ED TO INPATIENT HANDOFF REPORT  ED Nurse Name and Phone #: Lenell Antu Name/Age/Gender Jamie Watson 52 y.o. female Room/Bed: H024C/H024C  Code Status   Code Status: Full Code  Home/SNF/Other Home Patient oriented to: self, place, time, and situation Is this baseline? Yes   Triage Complete: Triage complete  Chief Complaint MS (multiple sclerosis) (HCC) [G35]  Triage Note Pt states she has not been feeling well x 2-3 days Experienced some dizziness and states that her  hands have felt numb and rt. Leg seems to be "staggering" Had dental procedure today and received a lot of Lidocaine and states she has not been able to use her hands "correctly" Arms and legs strength are equal Smile symmetrical  Alert and oriented CBG 121 in traige   Allergies No Known Allergies  Level of Care/Admitting Diagnosis ED Disposition     ED Disposition  Admit   Condition  --   Comment  Hospital Area: [facility] [[id number]]  Level of Care: Telemetry Medical [104]  May admit patient to [facility] or [facility] if equivalent level of care is available:: No  Covid Evaluation: Asymptomatic - no recent exposure (last 10 days) testing not required  Diagnosis: MS (multiple sclerosis) [facility] [[id number]]  Admitting Physician: Clydie Braun [[id number]]  Attending Physician: Clydie Braun [[id number]]  Certification:: I certify this patient will need inpatient services for at least 2 midnights  Expected Medical Readiness: 03/31/2023          B Medical/Surgery History Past Medical History:  Diagnosis Date   Acute pain of left knee 04/19/2019   Chronic headaches    Fatigue    Hypertension    Irregular heart beat    Menopause    Migraine    Right arm pain 04/19/2019   Seasonal allergies    Snoring    Weight gain    Past Surgical History:  Procedure Laterality Date   TONSILLECTOMY AND ADENOIDECTOMY       A IV Location/Drains/Wounds Patient Lines/Drains/Airways Status      Active Line/Drains/Airways     Name Placement date Placement time Site Days   Peripheral IV 03/27/23 20 G Right Antecubital 03/27/23  0222  Antecubital  less than 1            Intake/Output Last 24 hours No intake or output data in the 24 hours ending 03/27/23 1128  Labs/Imaging Results for orders placed or performed during the hospital encounter of 03/27/23 (from the past 48 hour(s))  Comprehensive metabolic panel     Status: Abnormal   Collection Time: 03/26/23 10:23 PM  Result Value Ref Range   Sodium 139 135 - 145 mmol/L   Potassium 3.3 (L) 3.5 - 5.1 mmol/L   Chloride 104 98 - 111 mmol/L   CO2 25 22 - 32 mmol/L   Glucose, Bld 105 (H) 70 - 99 mg/dL    Comment: Glucose reference range applies only to samples taken after fasting for at least 8 hours.   BUN 11 6 - 20 mg/dL   Creatinine, Ser 6.29 0.44 - 1.00 mg/dL   Calcium 9.4 8.9 - 52.8 mg/dL   Total Protein 7.7 6.5 - 8.1 g/dL   Albumin 4.4 3.5 - 5.0 g/dL   AST 30 15 - 41 U/L   ALT 30 0 - 44 U/L   Alkaline Phosphatase 57 38 - 126 U/L   Total Bilirubin 0.8 0.3 - 1.2 mg/dL   GFR, Estimated >41 >32 mL/min  Comment: (NOTE) Calculated using the CKD-EPI Creatinine Equation (2021)    Anion gap 10 5 - 15    Comment: Performed at [facility], [address] [place], [place] [address]  CBC with Differential     Status: None   Collection Time: 03/26/23 10:23 PM  Result Value Ref Range   WBC 4.7 4.0 - 10.5 K/uL   RBC 4.48 3.87 - 5.11 MIL/uL   Hemoglobin 13.5 12.0 - 15.0 g/dL   HCT 42.5 95.6 - 38.7 %   MCV 89.7 80.0 - 100.0 fL   MCH 30.1 26.0 - 34.0 pg   MCHC 33.6 30.0 - 36.0 g/dL   RDW 56.4 33.2 - 95.1 %   Platelets 257 150 - 400 K/uL   nRBC 0.0 0.0 - 0.2 %   Neutrophils Relative % 39 %   Neutro Abs 1.8 1.7 - 7.7 K/uL   Lymphocytes Relative 52 %   Lymphs Abs 2.5 0.7 - 4.0 K/uL   Monocytes Relative 6 %   Monocytes Absolute 0.3 0.1 - 1.0 K/uL   Eosinophils Relative 2 %   Eosinophils Absolute 0.1 0.0  - 0.5 K/uL   Basophils Relative 1 %   Basophils Absolute 0.0 0.0 - 0.1 K/uL   Immature Granulocytes 0 %   Abs Immature Granulocytes 0.01 0.00 - 0.07 K/uL    Comment: Performed at [facility], [address] [place], [place] [address]  POC CBG, ED     Status: Abnormal   Collection Time: 03/26/23 10:30 PM  Result Value Ref Range   Glucose-Capillary 121 (H) 70 - 99 mg/dL    Comment: Glucose reference range applies only to samples taken after fasting for at least 8 hours.  SARS Coronavirus 2 by RT PCR (hospital order, performed in [facility] hospital lab) *cepheid single result test* Anterior Nasal Swab     Status: None   Collection Time: 03/26/23 11:45 PM   Specimen: Anterior Nasal Swab  Result Value Ref Range   SARS Coronavirus 2 by RT PCR NEGATIVE NEGATIVE    Comment: (NOTE) SARS-CoV-2 target nucleic acids are NOT DETECTED.  The SARS-CoV-2 RNA is generally detectable in upper and lower respiratory specimens during the acute phase of infection. The lowest concentration of SARS-CoV-2 viral copies this assay can detect is 250 copies / mL. A negative result does not preclude SARS-CoV-2 infection and should not be used as the sole basis for treatment or other patient management decisions.  A negative result may occur with improper specimen collection / handling, submission of specimen other than nasopharyngeal swab, presence of viral mutation(s) within the areas targeted by this assay, and inadequate number of viral copies (<250 copies / mL). A negative result must be combined with clinical observations, patient history, and epidemiological information.  Fact Sheet for Patients:   RoadLapTop.co.za  Fact Sheet for Healthcare Providers: http://kim-miller.com/  This test is not yet approved or  cleared by the [place] FDA and has been authorized for detection and/or diagnosis of SARS-CoV-2 by FDA under an Emergency Use  Authorization (EUA).  This EUA will remain in effect (meaning this test can be used) for the duration of the COVID-19 declaration under Section 564(b)(1) of the Act, 21 U.S.C. section 360bbb-3(b)(1), unless the authorization is terminated or revoked sooner.  Performed at [facility], [address] [place], [place] [address]   Ethanol     Status: None   Collection Time: 03/27/23  2:26 AM  Result Value Ref Range   Alcohol, Ethyl (B) <10 <10 mg/dL    Comment: (NOTE) Lowest detectable limit for serum alcohol is 10 mg/dL.  For medical purposes only. Performed at [facility], [address] [place], [place] [address]   Protime-INR     Status: None   Collection Time: 03/27/23  2:26 AM  Result Value Ref Range   Prothrombin Time 13.0 11.4 - 15.2 seconds   INR 1.0 0.8 - 1.2    Comment: (NOTE) INR goal varies based on device and disease states. Performed at [facility], [address] [place], [place] [address]   APTT     Status: None   Collection Time: 03/27/23  2:26 AM  Result Value Ref Range   aPTT 31 24 - 36 seconds    Comment: Performed at [facility], [address] [place], [place] [address]  Urine rapid drug screen (hosp performed)     Status: Abnormal   Collection Time: 03/27/23  2:26 AM  Result Value Ref Range   Opiates NONE DETECTED NONE DETECTED   Cocaine NONE DETECTED NONE DETECTED   Benzodiazepines NONE DETECTED NONE DETECTED   Amphetamines NONE DETECTED NONE DETECTED   Tetrahydrocannabinol POSITIVE (A) NONE DETECTED   Barbiturates NONE DETECTED NONE DETECTED    Comment: (NOTE) DRUG SCREEN FOR MEDICAL PURPOSES ONLY.  IF CONFIRMATION IS NEEDED FOR ANY PURPOSE, NOTIFY LAB WITHIN 5 DAYS.  LOWEST DETECTABLE LIMITS FOR URINE DRUG SCREEN Drug Class                     Cutoff (ng/mL) Amphetamine and metabolites    1000 Barbiturate and metabolites    200 Benzodiazepine                 200 Opiates and metabolites         300 Cocaine and metabolites        300 THC                            50 Performed at [facility], [address] [place], [place] [address]   Urinalysis, Routine w reflex microscopic -Urine, Clean Catch     Status: Abnormal   Collection Time: 03/27/23  2:26 AM  Result Value Ref Range   Color, Urine YELLOW YELLOW   APPearance CLEAR CLEAR   Specific Gravity, Urine 1.025 1.005 - 1.030   pH 7.0 5.0 - 8.0   Glucose, UA NEGATIVE NEGATIVE mg/dL   Hgb urine dipstick TRACE (A) NEGATIVE   Bilirubin Urine NEGATIVE NEGATIVE   Ketones, ur NEGATIVE NEGATIVE mg/dL   Protein, ur NEGATIVE NEGATIVE mg/dL   Nitrite NEGATIVE NEGATIVE   Leukocytes,Ua NEGATIVE NEGATIVE    Comment: Performed at [facility], [address] [place], [place] [address]  Pregnancy, urine     Status: None   Collection Time: 03/27/23  2:26 AM  Result Value Ref Range   Preg Test, Ur NEGATIVE NEGATIVE    Comment:        THE SENSITIVITY OF THIS METHODOLOGY IS >25 mIU/mL. Performed at [facility], [address] [place], [place] [address]   Urinalysis, Microscopic (reflex)     Status: Abnormal   Collection Time: 03/27/23  2:26 AM  Result Value Ref Range   RBC / HPF 6-10 0 - 5 RBC/hpf   WBC, UA  0-5 0 - 5 WBC/hpf   Bacteria, UA RARE (A) NONE SEEN   Squamous Epithelial / HPF 0-5 0 - 5 /HPF   Mucus PRESENT     Comment: Performed at [facility], [address] [place], [place] [address]   MR Cervical Spine W and Wo Contrast  Addendum Date: 03/27/2023   ADDENDUM REPORT: 03/27/2023 10:09 ADDENDUM: On further review, there is a faint blush of enhancement in the right hemicord at the C3 level (sagittal image 7 series 14, axial image 12 series 15), suspicious for acute demyelination. INDICATION: Dizziness.  Headaches.  Right leg weakness. Electronically Signed   By: Orvan Falconer M.D.   On: 03/27/2023 10:09   Result Date: 03/27/2023 EXAM: MRI HEAD WITH CONTRAST MRI CERVICAL  SPINE WITHOUT AND WITH CONTRAST TECHNIQUE: Multiplanar, multiecho pulse sequences of the brain and surrounding structures were obtained with intravenous contrast. Multiplanar, multi sequence imaging of the cervical spine, to include the craniocervical junction and cervicothoracic junction, were obtained without and with intravenous contrast. CONTRAST:  9mL GADAVIST GADOBUTROL 1 MMOL/ML IV SOLN COMPARISON:  MRI brain 03/27/2023. FINDINGS: MRI HEAD FINDINGS Brain: Multiple T2 hyperintense lesions in the juxtacortical and periventricular white matter, suggestive of demyelinating disease. No enhancement to suggest acute demyelination. Vascular: Normal flow voids and vessel enhancement. Skull and upper cervical spine: Normal marrow signal and enhancement. Sinuses/Orbits: No acute findings. Other: None. MRI CERVICAL SPINE FINDINGS Alignment: Normal. Vertebrae: No fracture, evidence of discitis, or bone lesion. Cord: Moderately motion degraded study. Within this limitation, probable demyelinating lesions in the dorsal cord at the C3-4 and T2 levels (sagittal image 9 series 5/9). No enhancement to suggest acute demyelination. Posterior Fossa, vertebral arteries, paraspinal tissues: Unremarkable. Disc levels: C2-C3:  Normal. C3-C4:  Normal. C4-C5: Right-greater-than-left facet arthropathy and uncovertebral joint spurring results in moderate right neural foraminal narrowing. C5-C6: Disc bulge results in mild spinal canal stenosis. Facet arthropathy and uncovertebral joint spurring results in severe bilateral neural foraminal narrowing. C6-C7:  Normal. C7-T1:  Normal. IMPRESSION: 1. Multiple T2 hyperintense lesions in the juxtacortical and periventricular white matter, suggestive of demyelinating disease. No enhancement to suggest acute demyelination. 2. Moderately motion degraded cervical spine MRI. Within this limitation, probable demyelinating lesions in the dorsal cord at the C3-4 and T2 levels. No enhancement to suggest  acute demyelination. 3. Cervical spondylosis, worst at C5-C6, where there is mild spinal canal stenosis and severe bilateral neural foraminal narrowing. Electronically Signed: By: Orvan Falconer M.D. On: 03/27/2023 09:22   MR BRAIN W CONTRAST  Addendum Date: 03/27/2023   ADDENDUM REPORT: 03/27/2023 10:09 ADDENDUM: On further review, there is a faint blush of enhancement in the right hemicord at the C3 level (sagittal image 7 series 14, axial image 12 series 15), suspicious for acute demyelination. INDICATION: Dizziness.  Headaches.  Right leg weakness. Electronically Signed   By: Orvan Falconer M.D.   On: 03/27/2023 10:09   Result Date: 03/27/2023 EXAM: MRI HEAD WITH CONTRAST MRI CERVICAL SPINE WITHOUT AND WITH CONTRAST TECHNIQUE: Multiplanar, multiecho pulse sequences of the brain and surrounding structures were obtained with intravenous contrast. Multiplanar, multi sequence imaging of the cervical spine, to include the craniocervical junction and cervicothoracic junction, were obtained without and with intravenous contrast. CONTRAST:  9mL GADAVIST GADOBUTROL 1 MMOL/ML IV SOLN COMPARISON:  MRI brain 03/27/2023. FINDINGS: MRI HEAD FINDINGS Brain: Multiple T2 hyperintense lesions in the juxtacortical and periventricular white matter, suggestive of demyelinating disease. No enhancement to suggest acute demyelination. Vascular: Normal flow voids and  vessel enhancement. Skull and upper cervical spine: Normal marrow signal and enhancement. Sinuses/Orbits: No acute findings. Other: None. MRI CERVICAL SPINE FINDINGS Alignment: Normal. Vertebrae: No fracture, evidence of discitis, or bone lesion. Cord: Moderately motion degraded study. Within this limitation, probable demyelinating lesions in the dorsal cord at the C3-4 and T2 levels (sagittal image 9 series 5/9). No enhancement to suggest acute demyelination. Posterior Fossa, vertebral arteries, paraspinal tissues: Unremarkable. Disc levels: C2-C3:  Normal. C3-C4:   Normal. C4-C5: Right-greater-than-left facet arthropathy and uncovertebral joint spurring results in moderate right neural foraminal narrowing. C5-C6: Disc bulge results in mild spinal canal stenosis. Facet arthropathy and uncovertebral joint spurring results in severe bilateral neural foraminal narrowing. C6-C7:  Normal. C7-T1:  Normal. IMPRESSION: 1. Multiple T2 hyperintense lesions in the juxtacortical and periventricular white matter, suggestive of demyelinating disease. No enhancement to suggest acute demyelination. 2. Moderately motion degraded cervical spine MRI. Within this limitation, probable demyelinating lesions in the dorsal cord at the C3-4 and T2 levels. No enhancement to suggest acute demyelination. 3. Cervical spondylosis, worst at C5-C6, where there is mild spinal canal stenosis and severe bilateral neural foraminal narrowing. Electronically Signed: By: Orvan Falconer M.D. On: 03/27/2023 09:22   MR BRAIN WO CONTRAST  Result Date: 03/27/2023 CLINICAL DATA:  52 year old female with dizziness, bilateral hand numbness, neurologic deficit. Right leg symptoms for 3 days. EXAM: MRI HEAD WITHOUT CONTRAST TECHNIQUE: Multiplanar, multiecho pulse sequences of the brain and surrounding structures were obtained without intravenous contrast. COMPARISON:  Head CT last night. FINDINGS: Brain: Normal cerebral volume. No restricted diffusion to suggest acute infarction. No midline shift, mass effect, evidence of mass lesion, ventriculomegaly, extra-axial collection or acute intracranial hemorrhage. Cervicomedullary junction and pituitary are within normal limits. But there are bilateral scattered, nodular foci of T2 and FLAIR hyperintensity with facilitated diffusion in the periventricular white matter (series 7, image 35) and some of these appear oriented perpendicular to the ventricles. There is additional involvement of the left corona radiata and external capsule on that side. Conspicuous bilateral temporal  lobe periventricular involvement (on the right series 7, image 21). Deep gray nuclei, brainstem, and cerebellum relatively spared. None of the foci are restricted. No cortical involvement or encephalomalacia identified. No chronic cerebral blood products on SWI. Vascular: Major intracranial vascular flow voids are preserved. Skull and upper cervical spine: Negative visible cervical spine. Normal bone marrow signal. Sinuses/Orbits: Orbit motion artifact, grossly normal. Paranasal sinuses and mastoids are stable and well aerated. Other: Grossly negative visible internal auditory structures. Negative visible scalp and face. IMPRESSION: Bilateral, nodular T2/FLAIR hyperintense cerebral white matter lesions in a configuration strongly suggesting Multiple Sclerosis. No evidence of active demyelination on this noncontrast exam. Electronically Signed   By: Odessa Fleming M.D.   On: 03/27/2023 04:19   CT HEAD WO CONTRAST ( )  Result Date: 03/26/2023 CLINICAL DATA:  Dizziness and bilateral hand numbness EXAM: CT HEAD WITHOUT CONTRAST TECHNIQUE: Contiguous axial images were obtained from the base of the skull through the vertex without intravenous contrast. RADIATION DOSE REDUCTION: This exam was performed according to the departmental dose-optimization program which includes automated exposure control, adjustment of the mA and/or kV according to patient size and/or use of iterative reconstruction technique. COMPARISON:  None Available. FINDINGS: Brain: No evidence of acute infarction, hemorrhage, mass, mass effect, or midline shift. No hydrocephalus or extra-axial fluid collection. Partial empty sella. Normal craniocervical junction. Vascular: No hyperdense vessel. Skull: Negative for fracture or focal lesion. Sinuses/Orbits: The imaged paranasal sinuses are clear. Small osteoma in  the left ethmoid air cells. No acute finding in the orbits. Other: The mastoid air cells are well aerated. IMPRESSION: No acute intracranial  process. Electronically Signed   By: Wiliam Ke M.D.   On: 03/26/2023 23:29    Pending Labs Unresulted Labs (From admission, onward)     Start     Ordered   03/28/23 0500  CBC  Tomorrow morning,   R        03/27/23 1010   03/28/23 0500  Basic metabolic panel  Tomorrow morning,   R        03/27/23 1010   03/27/23 0957  HIV Antibody (routine testing w rflx)  (HIV Antibody (Routine testing w reflex) panel)  Once,   R        03/27/23 1010            Vitals/Pain Today's Vitals   03/27/23 0310 03/27/23 0420 03/27/23 0730 03/27/23 0736  BP: (!) 155/98   (!) 136/97  Pulse: 80   82  Resp: 18   15  Temp: 98.1 F (36.7 C)   98 F (36.7 C)  TempSrc: Oral     SpO2: 97%   99%  Weight:      Height:      PainSc:  Asleep 0-No pain     Isolation Precautions Airborne and Contact precautions  Medications Medications  methylPREDNISolone sodium succinate (SOLU-MEDROL) 1,000 mg in sodium chloride 0.9 % 50 mL IVPB (has no administration in time range)  enoxaparin (LOVENOX) injection 40 mg (has no administration in time range)  sodium chloride flush (NS) 0.9 % injection 3 mL (has no administration in time range)  acetaminophen (TYLENOL) tablet 650 mg (has no administration in time range)    Or  acetaminophen (TYLENOL) suppository 650 mg (has no administration in time range)  ondansetron (ZOFRAN) tablet 4 mg (has no administration in time range)    Or  ondansetron (ZOFRAN) injection 4 mg (has no administration in time range)  albuterol (PROVENTIL) (2.5 MG/3ML) 0.083% nebulizer solution 2.5 mg (has no administration in time range)  hydrALAZINE (APRESOLINE) injection 10 mg (has no administration in time range)  pantoprazole (PROTONIX) EC tablet 40 mg (has no administration in time range)  LORazepam (ATIVAN) injection 2 mg (2 mg Intravenous Given 03/27/23 0337)  acetaminophen (TYLENOL) tablet 1,000 mg (1,000 mg Oral Given 03/27/23 0235)  LORazepam (ATIVAN) injection 1 mg (1 mg Intravenous  Given 03/27/23 0745)  gadobutrol (GADAVIST) 1 MMOL/ML injection 9 mL (9 mLs Intravenous Contrast Given 03/27/23 0853)    Mobility walks with person assist     Focused Assessments     R Recommendations: See Admitting Provider Note  Report given to:   Additional Notes:

## 2023-03-27 NOTE — Progress Notes (Signed)
PT Cancellation Note  Patient Details Name: MAIDA NICKLAUS MRN: [id number] DOB: 20-Dec-1970   Cancelled Treatment:    Reason Eval/Treat Not Completed: Other (comment) per RN, pt lethargic after receiving ativan prior to MRI. Attempted to work with her but she remains lethargic, will attempt to return later in the day if time/schedule allow.   Nedra Hai, PT, DPT 03/27/23 11:34 AM

## 2023-03-27 NOTE — Evaluation (Signed)
Physical Therapy Evaluation Patient Details Name: Jamie Watson MRN: [id number] DOB: Oct 07, 1970 Today's Date: 03/27/2023  History of Present Illness  52yo female who presented to the ED on 9/18 due to increased malaise, dizziness, B hand weakness, R UE weakness, gait deviations due to R LE weakness/numbness, and vision changes in L eye >R eye. MRI with contrast shows multiple white matter lesions suggestive of possible demyelination. She endorses similar sx have come intermittently over the past several years.Admitted for further w/u. PMH fatigue, HTN, irriegular HR  Clinical Impression   Pt received in ED hallway stretcher, pleasant and cooperative with PT today. Understandably concerned about recent events, states "I have a lot of people depending on me". Able to mobilize relatively well, anticipate that mobility will improve with further medical w/u and appropriate interventions as well. Left in ED hall stretcher with all needs met, RN aware of pt status- definitely in need of skilled PT f/u following DC from this facility.         If plan is discharge home, recommend the following: A little help with walking and/or transfers;Assistance with cooking/housework;Assist for transportation;A little help with bathing/dressing/bathroom;Help with stairs or ramp for entrance   Can travel by private vehicle        Equipment Recommendations Rolling walker (2 wheels);Other (comment) (shower seat)  Recommendations for Other Services       Functional Status Assessment Patient has had a recent decline in their functional status and demonstrates the ability to make significant improvements in function in a reasonable and predictable amount of time.     Precautions / Restrictions Precautions Precautions: Fall;Other (comment) Precaution Comments: R UE and hand weakness > L, R LE weakness/numbness, L eye vision impairment Restrictions Weight Bearing Restrictions: No      Mobility  Bed  Mobility Overal bed mobility: Modified Independent             General bed mobility comments: increased time/effort    Transfers Overall transfer level: Needs assistance Equipment used: None Transfers: Sit to/from Stand Sit to Stand: Contact guard assist           General transfer comment: min guard/extra time to come to full standing position and steady    Ambulation/Gait Ambulation/Gait assistance: Contact guard assist Gait Distance (Feet): 80 Feet Assistive device: None Gait Pattern/deviations: Step-through pattern, Drifts right/left, Trunk flexed, Narrow base of support Gait velocity: decreased     General Gait Details: generally mildly unsteady without BUE support but needed no more than Min guard  Stairs            Wheelchair Mobility     Tilt Bed    Modified Rankin (Stroke Patients Only)       Balance Overall balance assessment: Needs assistance Sitting-balance support: Feet unsupported, Bilateral upper extremity supported Sitting balance-Leahy Scale: Good     Standing balance support: No upper extremity supported, During functional activity Standing balance-Leahy Scale: Fair                               Pertinent Vitals/Pain Pain Assessment Pain Assessment: Faces Faces Pain Scale: Hurts even more Pain Location: mouth where tooth was removed Pain Descriptors / Indicators: Aching, Discomfort Pain Intervention(s): Limited activity within patient's tolerance, Premedicated before session    Home Living Family/patient expects to be discharged to:: Private residence Living Arrangements: Alone Available Help at Discharge: Family;Friend(s);Available 24 hours/day Type of Home: Apartment Home Access: Stairs to enter  Entrance Stairs-Rails: Right Entrance Stairs-Number of Steps: lives on 3rd story, no elevator available   Home Layout: One level Home Equipment: None      Prior Function Prior Level of Function :  Independent/Modified Independent                     Extremity/Trunk Assessment   Upper Extremity Assessment Upper Extremity Assessment: Defer to OT evaluation    Lower Extremity Assessment Lower Extremity Assessment: Overall WFL for tasks assessed (BLE strength symmetrical)    Cervical / Trunk Assessment Cervical / Trunk Assessment: Normal  Communication   Communication Communication: No apparent difficulties  Cognition Arousal: Alert Behavior During Therapy: WFL for tasks assessed/performed                                            General Comments General comments (skin integrity, edema, etc.): possible vestibular involvement noted, will need further w/u    Exercises     Assessment/Plan    PT Assessment Patient needs continued PT services  PT Problem List Decreased balance;Decreased strength;Decreased mobility;Decreased activity tolerance;Decreased coordination       PT Treatment Interventions DME instruction;Therapeutic activities;Gait training;Therapeutic exercise;Patient/family education;Stair training;Balance training;Functional mobility training;Neuromuscular re-education    PT Goals (Current goals can be found in the Care Plan section)  Acute Rehab PT Goals Patient Stated Goal: go home when medically ready PT Goal Formulation: With patient Time For Goal Achievement: 04/10/23 Potential to Achieve Goals: Good    Frequency Min 1X/week     Co-evaluation               AM-PAC PT "6 Clicks" Mobility  Outcome Measure Help needed turning from your back to your side while in a flat bed without using bedrails?: None Help needed moving from lying on your back to sitting on the side of a flat bed without using bedrails?: None Help needed moving to and from a bed to a chair (including a wheelchair)?: A Little Help needed standing up from a chair using your arms (e.g., wheelchair or bedside chair)?: A Little Help needed to walk in  hospital room?: A Little Help needed climbing 3-5 steps with a railing? : A Little 6 Click Score: 20    End of Session Equipment Utilized During Treatment: Gait belt Activity Tolerance: Patient tolerated treatment well Patient left: in bed;Other (comment) (ED hall stretcher in front of RN station) Nurse Communication: Mobility status PT Visit Diagnosis: Unsteadiness on feet (R26.81);Other abnormalities of gait and mobility (R26.89);Difficulty in walking, not elsewhere classified (R26.2);Other symptoms and signs involving the nervous system (R29.898)    Time: 2956-2130 PT Time Calculation (min) (ACUTE ONLY): 15 min   Charges:   PT Evaluation $PT Eval Moderate Complexity: 1 Mod   PT General Charges $$ ACUTE PT VISIT: 1 Visit        Nedra Hai, PT, DPT 03/27/23 2:56 PM

## 2023-03-27 NOTE — Progress Notes (Signed)
MRI with both enhancing and nonenhancing lesions, most consistent with multiple sclerosis.  I would favor IV Solu-Medrol for 3 to 5 days depending on response.  Neurology will follow.  Ritta Slot, MD [facility] [phone]  If 7pm- 7am, please page neurology on call as listed in AMION.

## 2023-03-27 NOTE — ED Notes (Signed)
ED Provider at bedside. 

## 2023-03-27 NOTE — H&P (Signed)
History and Physical    Patient: Jamie Watson ZOX:[id number] DOB: [birth year] DOA: 03/27/2023 DOS: the patient was seen and examined on 03/27/2023 PCP: Loyola Mast, MD  Patient coming from: Home  Chief Complaint:  Chief Complaint  Patient presents with   Dizziness   HPI: Jamie Watson is a 52 y.o. female with medical history significant of hypertension, chronic headaches, and obesity presents with complaints of weakness.  For the last 3 days patient reports having decreased sensation in her left hand more so than the right.  Also reports decreased motor function in her right arm and leg.  Symptoms initially started while she was washing dishes.  Reports having difficulty gripping pencil and writing her name.  Denies having any headache, changes in vision, chest pain, palpitations, or recent sick contacts.  Family history is significant for lupus and a maternal aunt, but denies any history of knowledge.  In the emergency department patient was noted to be afebrile with blood pressures elevated up to 155/98, and all other vital signs maintained.  Labs significant for potassium 3.3.  CT scan of the brain head noted no acute abnormality.  Urine pregnancy screen was negative.  Urinalysis did not note significant signs for infection.  UDS positive for marijuana.  MRI of the brain with contrast, was significant for multiple T2 hyperintense lesions in the juxtacortical and periventricular white matter, and probable lesions noted in the dorsal cord at 3-4 and T2 levels. Neurology had been consulted and started patient on high-dose Solu-Medrol 1 g IV.  Review of Systems: As mentioned in the history of present illness. All other systems reviewed and are negative. Past Medical History:  Diagnosis Date   Acute pain of left knee 04/19/2019   Chronic headaches    Fatigue    Hypertension    Irregular heart beat    Menopause    Migraine    Right arm pain 04/19/2019   Seasonal allergies    Snoring     Weight gain    Past Surgical History:  Procedure Laterality Date   TONSILLECTOMY AND ADENOIDECTOMY     Social History:  reports that she quit smoking about 12 years ago. Her smoking use included cigarettes. She has never used smokeless tobacco. She reports current alcohol use. She reports current drug use. Drug: Marijuana.  No Known Allergies  Family History  Problem Relation Age of Onset   Heart disease Mother    Atrial fibrillation Mother    Cancer Father        Brain   Diabetes Father    Stroke Maternal Aunt    Cancer Maternal Aunt        Stomach   Diabetes Maternal Uncle    Diabetes Maternal Uncle    Diabetes Maternal Uncle    Diabetes Maternal Grandmother    Stroke Maternal Grandmother    Heart disease Maternal Grandfather     Prior to Admission medications   Medication Sig Start Date End Date Taking? Authorizing Provider  amLODipine (NORVASC) 5 MG tablet Take 1 tablet (5 mg total) by mouth at bedtime. 01/28/22  Yes Loyola Mast, MD  valsartan-hydrochlorothiazide (DIOVAN-HCT) 160-12.5 MG tablet Take 1 tablet by mouth daily. 01/28/22  Yes Loyola Mast, MD  Blood Pressure Monitoring (OMRON 3 SERIES BP MONITOR) DEVI USE AS DIRECTED 11/29/21       Physical Exam: Vitals:   03/27/23 0128 03/27/23 0213 03/27/23 0310 03/27/23 0736  BP: (!) 144/101 (!) 133/92 (!) 155/98 (!) 136/97  Pulse: 71 81 80 82  Resp: 18 13 18 15   Temp:  98.8 F (37.1 C) 98.1 F (36.7 C) 98 F (36.7 C)  TempSrc:  Oral Oral   SpO2: 100% 98% 97% 99%  Weight:      Height:        Constitutional: Middle-age female who appears to be in no acute distress at this time Eyes: PERRL, lids and conjunctivae normal ENMT: Mucous membranes are moist. Posterior pharynx clear of any exudate or lesions.  Neck: normal, supple, no masses  Respiratory: clear to auscultation bilaterally, no wheezing, no crackles. Normal respiratory effort. No accessory muscle use.  Cardiovascular: Regular rate and rhythm, no  murmurs / rubs / gallops. No extremity edema. 2+ pedal pulses. No carotid bruits.  Abdomen: no tenderness, no masses palpated. No hepatosplenomegaly. Bowel sounds positive.  Musculoskeletal: no clubbing / cyanosis. No joint deformity upper and lower extremities. Good ROM, no contractures. Normal muscle tone.  Skin: no rashes, lesions, ulcers. No induration Neurologic: CN 2-12 grossly intact.  Sensation abnormal the left upper extremity. Strength 4+/5 in the right upper extremity Psychiatric: Normal judgment and insight. Alert and oriented x 3.  Tearful mood Data Reviewed:  EKG revealed normal sinus rhythm 81 bpm.  Reviewed labs, imaging, and pertinent records as noted above in HPI.  Assessment and Plan:  Multiple sclerosis Patient presents with complaints of weakness mostly in the right leg, but also in her bilateral hands. MRI of the brain with contrast, was significant for multiple T2 hyperintense lesions in the juxtacortical and periventricular white matter, and probable lesions noted in the dorsal cord at 3-4 and T2 levels.  Neurology consulted and felt finding suggestive of multiple sclerosis.  Patient was started on high-dose steroids. -Admit to a medical telemetry bed -Continue Solu-Medrol 1 g IV daily -PT to evaluate and treat  Hypokalemia Acute.  Initial potassium 3.3.  Low potassium levels thought secondary to diuretic patient is on for blood pressure. -Give potassium chloride 40 meq p.o. -Continue to monitor and replace as needed  Essential hypertension Blood pressures elevated up to 155/98.  Home blood pressure medications include amlodipine 5 mg nightly and valsartan-hydrochlorothiazide 160-12.5 mg daily. -Continue home blood pressure regimen with pharmacy substitution  Obesity BMI 32.77 kg/m  DVT prophylaxis: Lovenox Advance Care Planning:   Code Status: Full Code  Consults: Neuro  Family Communication: Decline when asked  Severity of Illness: The appropriate  patient status for this patient is INPATIENT. Inpatient status is judged to be reasonable and necessary in order to provide the required intensity of service to ensure the patient's safety. The patient's presenting symptoms, physical exam findings, and initial radiographic and laboratory data in the context of their chronic comorbidities is felt to place them at high risk for further clinical deterioration. Furthermore, it is not anticipated that the patient will be medically stable for discharge from the hospital within 2 midnights of admission.   * I certify that at the point of admission it is my clinical judgment that the patient will require inpatient hospital care spanning beyond 2 midnights from the point of admission due to high intensity of service, high risk for further deterioration and high frequency of surveillance required.*  Author: Clydie Braun, MD 03/27/2023 10:01 AM  For on call review [email or web address].

## 2023-03-27 NOTE — Progress Notes (Signed)
Jamie Watson accompanied family member to the hospital on 03/27/2023. They may return to school on 03/28/2023..   Treatment Team:  Attending Provider: Clydie Braun, MD

## 2023-03-27 NOTE — Discharge Instructions (Addendum)
Follow with Primary MD Jamie Mast, MD in 7 days   Get CBC, CMP, magnesium, vitamin D, TSH, B12-  checked next visit with your primary MD    Activity: As tolerated with Full fall precautions use walker/cane & assistance as needed  Disposition Home    Diet: Heart Healthy   Special Instructions: If you have smoked or chewed Tobacco  in the last 2 yrs please stop smoking, stop any regular Alcohol  and or any Recreational drug use.  On your next visit with your primary care physician please Get Medicines reviewed and adjusted.  Please request your Prim.MD to go over all Hospital Tests and Procedure/Radiological results at the follow up, please get all Hospital records sent to your Prim MD by signing hospital release before you go home.  If you experience worsening of your admission symptoms, develop shortness of breath, life threatening emergency, suicidal or homicidal thoughts you must seek medical attention immediately by calling 911 or calling your MD immediately  if symptoms less severe.  You Must read complete instructions/literature along with all the possible adverse reactions/side effects for all the Medicines you take and that have been prescribed to you. Take any new Medicines after you have completely understood and accpet all the possible adverse reactions/side effects.   Do not drive when taking Pain medications.  Do not take more than prescribed Pain, Sleep and Anxiety Medications

## 2023-03-27 NOTE — ED Provider Notes (Signed)
[facility] Emergency Department Provider Note MRN:  [id number]  Arrival date & time: 03/27/23     Chief Complaint   Dizziness   History of Present Illness   Jamie Watson is a 52 y.o. year-old female with a history of hypertension presenting to the ED with chief complaint of dizziness.  Patient explains that she had an abnormal sensory feeling when washing her hands few days ago.  Then 2 days ago she noticed trouble signing her name, could not form the right motions.  She has noticed some clumsiness and decreased strength to the right hand/arm.  Sometimes her right leg seems to be staggering.  Overall just feeling generally unwell for the past 2 or 3 days.  Review of Systems  A thorough review of systems was obtained and all systems are negative except as noted in the HPI and PMH.   Patient's Health History    Past Medical History:  Diagnosis Date   Acute pain of left knee 04/19/2019   Chronic headaches    Fatigue    Hypertension    Irregular heart beat    Menopause    Migraine    Right arm pain 04/19/2019   Seasonal allergies    Snoring    Weight gain     Past Surgical History:  Procedure Laterality Date   TONSILLECTOMY AND ADENOIDECTOMY      Family History  Problem Relation Age of Onset   Heart disease Mother    Atrial fibrillation Mother    Cancer Father        Brain   Diabetes Father    Stroke Maternal Aunt    Cancer Maternal Aunt        Stomach   Diabetes Maternal Uncle    Diabetes Maternal Uncle    Diabetes Maternal Uncle    Diabetes Maternal Grandmother    Stroke Maternal Grandmother    Heart disease Maternal Grandfather     Social History   Socioeconomic History   Marital status: Legally Separated    Spouse name: Not on file   Number of children: 0   Years of education: Not on file   Highest education level: Bachelor's degree (e.g., BA, AB, BS)  Occupational History   Not on file  Tobacco Use   Smoking status:  Former    Current packs/day: 0.00    Types: Cigarettes    Quit date: 2012    Years since quitting: 12.7   Smokeless tobacco: Never  Vaping Use   Vaping status: Never Used  Substance and Sexual Activity   Alcohol use: Yes    Comment: Rarely   Drug use: Yes    Types: Marijuana    Comment: Daily use   Sexual activity: Yes    Birth control/protection: None  Other Topics Concern   Not on file  Social History Narrative   Not on file   Social Determinants of Health   Financial Resource Strain: Not on file  Food Insecurity: Not on file  Transportation Needs: Not on file  Physical Activity: Not on file  Stress: Not on file  Social Connections: Not on file  Intimate Partner Violence: Not on file     Physical Exam   Vitals:   03/27/23 0128 03/27/23 0213  BP: (!) 144/101 (!) 133/92  Pulse: 71 81  Resp: 18 13  Temp:  98.8 F (37.1 C)  SpO2: 100% 98%    CONSTITUTIONAL: Well-appearing, NAD NEURO/PSYCH:  Alert and oriented x 3,  mild decreased grip strength right hand, subtle right pronator drift, normal speech EYES:  eyes equal and reactive ENT/NECK:  no LAD, no JVD CARDIO: Regular rate, well-perfused, normal S1 and S2 PULM:  CTAB no wheezing or rhonchi GI/GU:  non-distended, non-tender MSK/SPINE:  No gross deformities, no edema SKIN:  no rash, atraumatic   *Additional and/or pertinent findings included in MDM below  Diagnostic and Interventional Summary    EKG Interpretation Date/Time:  Wednesday March 26 2023 22:24:30 EDT Ventricular Rate:  81 PR Interval:  185 QRS Duration:  89 QT Interval:  364 QTC Calculation: 423 R Axis:   48  Text Interpretation: Sinus rhythm No old tracing to compare Confirmed by Meridee Score [phone]) on 03/26/2023 10:37:57 PM       Labs Reviewed  COMPREHENSIVE METABOLIC PANEL - Abnormal; Notable for the following components:      Result Value   Potassium 3.3 (*)    Glucose, Bld 105 (*)    All other components within normal  limits  CBG MONITORING, ED - Abnormal; Notable for the following components:   Glucose-Capillary 121 (*)    All other components within normal limits  SARS CORONAVIRUS 2 BY RT PCR  CBC WITH DIFFERENTIAL/PLATELET  ETHANOL  PROTIME-INR  APTT  RAPID URINE DRUG SCREEN, HOSP PERFORMED  URINALYSIS, ROUTINE W REFLEX MICROSCOPIC  PREGNANCY, URINE    CT HEAD WO CONTRAST ( )  Final Result    MR BRAIN WO CONTRAST    (Results Pending)    Medications  LORazepam (ATIVAN) injection 2 mg (has no administration in time range)     Procedures  /  Critical Care Procedures  ED Course and Medical Decision Making  Initial Impression and Ddx Suspicion for stroke, last known well nearly 2 days ago.  Case discussed with Dr. Otelia Limes of neurology who agrees with the plan for transfer to [facility] for emergent MRI imaging.  Past medical/surgical history that increases complexity of ED encounter: Hypertension  Interpretation of Diagnostics I personally reviewed the EKG and my interpretation is as follows: Sinus rhythm  Labs overall reassuring with no significant blood count or electrolyte disturbance.  CT head is normal.  Patient Reassessment and Ultimate Disposition/Management     Transfer via POV.  Dr. Preston Fleeting is the accepting ED provider.  Patient management required discussion with the following services or consulting groups:  Neurology  Complexity of Problems Addressed Acute illness or injury that poses threat of life of bodily function  Additional Data Reviewed and Analyzed Further history obtained from: Further history from spouse/family member  Additional Factors Impacting ED Encounter Risk Consideration of hospitalization  Elmer Sow. Pilar Plate, MD [facility] Emergency Medicine [facility] [email or web address]  Final Clinical Impressions(s) / ED Diagnoses     ICD-10-CM   1. Neurological symptoms  R29.90       ED Discharge Orders     None        Discharge  Instructions Discussed with and Provided to Patient:    Discharge Instructions      You are being transferred to the [facility] emergency department for MRI.      Sabas Sous, MD 03/27/23 [phone]

## 2023-03-27 NOTE — Progress Notes (Signed)
PT eval complete/documentation pending. Able to mobilize with Min guard with PT, but will really benefit from RW, shower chair, and HHPT at time of DC.  Nedra Hai, PT, DPT 03/27/23 1:59 PM

## 2023-03-27 NOTE — ED Notes (Signed)
Patient to MRI.

## 2023-03-27 NOTE — ED Notes (Signed)
Pt returned from MRI °

## 2023-03-27 NOTE — ED Notes (Signed)
ED TO INPATIENT HANDOFF REPORT  ED Nurse Name and Phone #: [phone]  S Name/Age/Gender Jamie Watson 52 y.o. female Room/Bed: 039C/039C  Code Status   Code Status: Full Code  Home/SNF/Other Home Patient oriented to: self, place, time, and situation Is this baseline? Yes   Triage Complete: Triage complete  Chief Complaint MS (multiple sclerosis) (HCC) [G35]  Triage Note Pt states she has not been feeling well x 2-3 days Experienced some dizziness and states that her  hands have felt numb and rt. Leg seems to be "staggering" Had dental procedure today and received a lot of Lidocaine and states she has not been able to use her hands "correctly" Arms and legs strength are equal Smile symmetrical  Alert and oriented CBG 121 in traige   Allergies No Known Allergies  Level of Care/Admitting Diagnosis ED Disposition     ED Disposition  Admit   Condition  --   Comment  Hospital Area: [facility] [[id number]]  Level of Care: Telemetry Medical [104]  May admit patient to [facility] or [facility] if equivalent level of care is available:: No  Covid Evaluation: Asymptomatic - no recent exposure (last 10 days) testing not required  Diagnosis: MS (multiple sclerosis) [facility]) [[id number]]  Admitting Physician: Clydie Braun [[id number]]  Attending Physician: Clydie Braun [[id number]]  Certification:: I certify this patient will need inpatient services for at least 2 midnights  Expected Medical Readiness: 03/31/2023          B Medical/Surgery History Past Medical History:  Diagnosis Date   Acute pain of left knee 04/19/2019   Chronic headaches    Fatigue    Hypertension    Irregular heart beat    Menopause    Migraine    Right arm pain 04/19/2019   Seasonal allergies    Snoring    Weight gain    Past Surgical History:  Procedure Laterality Date   TONSILLECTOMY AND ADENOIDECTOMY       A IV Location/Drains/Wounds Patient Lines/Drains/Airways  Status     Active Line/Drains/Airways     Name Placement date Placement time Site Days   Peripheral IV 03/27/23 20 G Right Antecubital 03/27/23  0222  Antecubital  less than 1            Intake/Output Last 24 hours  Intake/Output Summary (Last 24 hours) at 03/27/2023 1644 Last data filed at 03/27/2023 1631 Gross per 24 hour  Intake 50 ml  Output --  Net 50 ml    Labs/Imaging Results for orders placed or performed during the hospital encounter of 03/27/23 (from the past 48 hour(s))  Comprehensive metabolic panel     Status: Abnormal   Collection Time: 03/26/23 10:23 PM  Result Value Ref Range   Sodium 139 135 - 145 mmol/L   Potassium 3.3 (L) 3.5 - 5.1 mmol/L   Chloride 104 98 - 111 mmol/L   CO2 25 22 - 32 mmol/L   Glucose, Bld 105 (H) 70 - 99 mg/dL    Comment: Glucose reference range applies only to samples taken after fasting for at least 8 hours.   BUN 11 6 - 20 mg/dL   Creatinine, Ser 5.78 0.44 - 1.00 mg/dL   Calcium 9.4 8.9 - 46.9 mg/dL   Total Protein 7.7 6.5 - 8.1 g/dL   Albumin 4.4 3.5 - 5.0 g/dL   AST 30 15 - 41 U/L   ALT 30 0 - 44 U/L   Alkaline Phosphatase 57 38 -  126 U/L   Total Bilirubin 0.8 0.3 - 1.2 mg/dL   GFR, Estimated >16 >10 mL/min    Comment: (NOTE) Calculated using the CKD-EPI Creatinine Equation (2021)    Anion gap 10 5 - 15    Comment: Performed at [facility], [address] [place], [place] [address]  CBC with Differential     Status: None   Collection Time: 03/26/23 10:23 PM  Result Value Ref Range   WBC 4.7 4.0 - 10.5 K/uL   RBC 4.48 3.87 - 5.11 MIL/uL   Hemoglobin 13.5 12.0 - 15.0 g/dL   HCT 40.9 81.1 - 91.4 %   MCV 89.7 80.0 - 100.0 fL   MCH 30.1 26.0 - 34.0 pg   MCHC 33.6 30.0 - 36.0 g/dL   RDW 78.2 95.6 - 21.3 %   Platelets 257 150 - 400 K/uL   nRBC 0.0 0.0 - 0.2 %   Neutrophils Relative % 39 %   Neutro Abs 1.8 1.7 - 7.7 K/uL   Lymphocytes Relative 52 %   Lymphs Abs 2.5 0.7 - 4.0 K/uL   Monocytes Relative 6 %    Monocytes Absolute 0.3 0.1 - 1.0 K/uL   Eosinophils Relative 2 %   Eosinophils Absolute 0.1 0.0 - 0.5 K/uL   Basophils Relative 1 %   Basophils Absolute 0.0 0.0 - 0.1 K/uL   Immature Granulocytes 0 %   Abs Immature Granulocytes 0.01 0.00 - 0.07 K/uL    Comment: Performed at [facility], [address] [place], [place] [address]  POC CBG, ED     Status: Abnormal   Collection Time: 03/26/23 10:30 PM  Result Value Ref Range   Glucose-Capillary 121 (H) 70 - 99 mg/dL    Comment: Glucose reference range applies only to samples taken after fasting for at least 8 hours.  SARS Coronavirus 2 by RT PCR (hospital order, performed in [facility] hospital lab) *cepheid single result test* Anterior Nasal Swab     Status: None   Collection Time: 03/26/23 11:45 PM   Specimen: Anterior Nasal Swab  Result Value Ref Range   SARS Coronavirus 2 by RT PCR NEGATIVE NEGATIVE    Comment: (NOTE) SARS-CoV-2 target nucleic acids are NOT DETECTED.  The SARS-CoV-2 RNA is generally detectable in upper and lower respiratory specimens during the acute phase of infection. The lowest concentration of SARS-CoV-2 viral copies this assay can detect is 250 copies / mL. A negative result does not preclude SARS-CoV-2 infection and should not be used as the sole basis for treatment or other patient management decisions.  A negative result may occur with improper specimen collection / handling, submission of specimen other than nasopharyngeal swab, presence of viral mutation(s) within the areas targeted by this assay, and inadequate number of viral copies (<250 copies / mL). A negative result must be combined with clinical observations, patient history, and epidemiological information.  Fact Sheet for Patients:   RoadLapTop.co.za  Fact Sheet for Healthcare Providers: http://kim-miller.com/  This test is not yet approved or  cleared by the [place] FDA  and has been authorized for detection and/or diagnosis of SARS-CoV-2 by FDA under an Emergency Use Authorization (EUA).  This EUA will remain in effect (meaning this test can be used) for the duration of the COVID-19 declaration under Section 564(b)(1) of the Act, 21 U.S.C. section 360bbb-3(b)(1), unless the authorization is terminated or revoked sooner.  Performed at [facility], [address] [place], [place] [address]   Ethanol     Status: None   Collection Time: 03/27/23  2:26 AM  Result Value Ref Range   Alcohol, Ethyl (B) <10 <10 mg/dL    Comment: (NOTE) Lowest detectable limit for serum alcohol is 10 mg/dL.  For medical purposes only. Performed at [facility], [address] [place], [place] [address]   Protime-INR     Status: None   Collection Time: 03/27/23  2:26 AM  Result Value Ref Range   Prothrombin Time 13.0 11.4 - 15.2 seconds   INR 1.0 0.8 - 1.2    Comment: (NOTE) INR goal varies based on device and disease states. Performed at [facility], [address] [place], [place] [address]   APTT     Status: None   Collection Time: 03/27/23  2:26 AM  Result Value Ref Range   aPTT 31 24 - 36 seconds    Comment: Performed at [facility], [address] [place], [place] [address]  Urine rapid drug screen (hosp performed)     Status: Abnormal   Collection Time: 03/27/23  2:26 AM  Result Value Ref Range   Opiates NONE DETECTED NONE DETECTED   Cocaine NONE DETECTED NONE DETECTED   Benzodiazepines NONE DETECTED NONE DETECTED   Amphetamines NONE DETECTED NONE DETECTED   Tetrahydrocannabinol POSITIVE (A) NONE DETECTED   Barbiturates NONE DETECTED NONE DETECTED    Comment: (NOTE) DRUG SCREEN FOR MEDICAL PURPOSES ONLY.  IF CONFIRMATION IS NEEDED FOR ANY PURPOSE, NOTIFY LAB WITHIN 5 DAYS.  LOWEST DETECTABLE LIMITS FOR URINE DRUG SCREEN Drug Class                     Cutoff (ng/mL) Amphetamine and metabolites     1000 Barbiturate and metabolites    200 Benzodiazepine                 200 Opiates and metabolites        300 Cocaine and metabolites        300 THC                            50 Performed at [facility], [address] [place], [place] [address]   Urinalysis, Routine w reflex microscopic -Urine, Clean Catch     Status: Abnormal   Collection Time: 03/27/23  2:26 AM  Result Value Ref Range   Color, Urine YELLOW YELLOW   APPearance CLEAR CLEAR   Specific Gravity, Urine 1.025 1.005 - 1.030   pH 7.0 5.0 - 8.0   Glucose, UA NEGATIVE NEGATIVE mg/dL   Hgb urine dipstick TRACE (A) NEGATIVE   Bilirubin Urine NEGATIVE NEGATIVE   Ketones, ur NEGATIVE NEGATIVE mg/dL   Protein, ur NEGATIVE NEGATIVE mg/dL   Nitrite NEGATIVE NEGATIVE   Leukocytes,Ua NEGATIVE NEGATIVE    Comment: Performed at [facility], [address] [place], [place] [address]  Pregnancy, urine     Status: None   Collection Time: 03/27/23  2:26 AM  Result Value Ref Range   Preg Test, Ur NEGATIVE NEGATIVE    Comment:        THE SENSITIVITY OF THIS METHODOLOGY IS >25 mIU/mL. Performed at [facility], [address] [place], [place] [address]   Urinalysis, Microscopic (reflex)     Status: Abnormal   Collection Time: 03/27/23  2:26 AM  Result Value Ref Range   RBC / HPF 6-10 0 - 5 RBC/hpf   WBC, UA 0-5 0 - 5 WBC/hpf   Bacteria, UA RARE (A) NONE SEEN   Squamous Epithelial / HPF 0-5 0 - 5 /HPF   Mucus PRESENT     Comment: Performed at [facility], [address] [place], [place] [address]   MR Cervical Spine W and Wo Contrast  Addendum Date: 03/27/2023   ADDENDUM REPORT: 03/27/2023 10:09 ADDENDUM: On further review, there is a faint blush of enhancement in the right hemicord at the C3 level (sagittal image 7 series 14, axial image 12 series 15), suspicious for acute demyelination. INDICATION: Dizziness.  Headaches.  Right leg weakness. Electronically Signed   By: Orvan Falconer M.D.   On: 03/27/2023 10:09   Result Date: 03/27/2023 EXAM: MRI HEAD WITH CONTRAST MRI CERVICAL SPINE WITHOUT AND WITH CONTRAST TECHNIQUE: Multiplanar, multiecho pulse sequences of the brain and surrounding structures were obtained with intravenous contrast. Multiplanar, multi sequence imaging of the cervical spine, to include the craniocervical junction and cervicothoracic junction, were obtained without and with intravenous contrast. CONTRAST:  9mL GADAVIST GADOBUTROL 1 MMOL/ML IV SOLN COMPARISON:  MRI brain 03/27/2023. FINDINGS: MRI HEAD FINDINGS Brain: Multiple T2 hyperintense lesions in the juxtacortical and periventricular white matter, suggestive of demyelinating disease. No enhancement to suggest acute demyelination. Vascular: Normal flow voids and vessel enhancement. Skull and upper cervical spine: Normal marrow signal and enhancement. Sinuses/Orbits: No acute findings. Other: None. MRI CERVICAL SPINE FINDINGS Alignment: Normal. Vertebrae: No fracture, evidence of discitis, or bone lesion. Cord: Moderately motion degraded study. Within this limitation, probable demyelinating lesions in the dorsal cord at the C3-4 and T2 levels (sagittal image 9 series 5/9). No enhancement to suggest acute demyelination. Posterior Fossa, vertebral arteries, paraspinal tissues: Unremarkable. Disc levels: C2-C3:  Normal. C3-C4:  Normal. C4-C5: Right-greater-than-left facet arthropathy and uncovertebral joint spurring results in moderate right neural foraminal narrowing. C5-C6: Disc bulge results in mild spinal canal stenosis. Facet arthropathy and uncovertebral joint spurring results in severe bilateral neural foraminal narrowing. C6-C7:  Normal. C7-T1:  Normal. IMPRESSION: 1. Multiple T2 hyperintense lesions in the juxtacortical and periventricular white matter, suggestive of demyelinating disease. No enhancement to suggest acute demyelination. 2. Moderately motion degraded cervical spine MRI. Within this  limitation, probable demyelinating lesions in the dorsal cord at the C3-4 and T2 levels. No enhancement to suggest acute demyelination. 3. Cervical spondylosis, worst at C5-C6, where there is mild spinal canal stenosis and severe bilateral neural foraminal narrowing. Electronically Signed: By: Orvan Falconer M.D. On: 03/27/2023 09:22   MR BRAIN W CONTRAST  Addendum Date: 03/27/2023   ADDENDUM REPORT: 03/27/2023 10:09 ADDENDUM: On further review, there is a faint blush of enhancement in the right hemicord at the C3 level (sagittal image 7 series 14, axial image 12 series 15), suspicious for acute demyelination. INDICATION: Dizziness.  Headaches.  Right leg weakness. Electronically Signed   By: Orvan Falconer M.D.   On: 03/27/2023 10:09   Result Date: 03/27/2023 EXAM: MRI HEAD WITH CONTRAST MRI CERVICAL SPINE WITHOUT AND WITH CONTRAST TECHNIQUE: Multiplanar, multiecho pulse sequences of the brain and surrounding structures were obtained with intravenous contrast. Multiplanar, multi sequence imaging of the cervical spine, to include the craniocervical junction and cervicothoracic junction, were obtained without and with intravenous contrast. CONTRAST:  9mL GADAVIST GADOBUTROL 1 MMOL/ML IV SOLN COMPARISON:  MRI brain 03/27/2023. FINDINGS: MRI HEAD FINDINGS Brain: Multiple T2 hyperintense lesions in  the juxtacortical and periventricular white matter, suggestive of demyelinating disease. No enhancement to suggest acute demyelination. Vascular: Normal flow voids and vessel enhancement. Skull and upper cervical spine: Normal marrow signal and enhancement. Sinuses/Orbits: No acute findings. Other: None. MRI CERVICAL SPINE FINDINGS Alignment: Normal. Vertebrae: No fracture, evidence of discitis, or bone lesion. Cord: Moderately motion degraded study. Within this limitation, probable demyelinating lesions in the dorsal cord at the C3-4 and T2 levels (sagittal image 9 series 5/9). No enhancement to suggest acute  demyelination. Posterior Fossa, vertebral arteries, paraspinal tissues: Unremarkable. Disc levels: C2-C3:  Normal. C3-C4:  Normal. C4-C5: Right-greater-than-left facet arthropathy and uncovertebral joint spurring results in moderate right neural foraminal narrowing. C5-C6: Disc bulge results in mild spinal canal stenosis. Facet arthropathy and uncovertebral joint spurring results in severe bilateral neural foraminal narrowing. C6-C7:  Normal. C7-T1:  Normal. IMPRESSION: 1. Multiple T2 hyperintense lesions in the juxtacortical and periventricular white matter, suggestive of demyelinating disease. No enhancement to suggest acute demyelination. 2. Moderately motion degraded cervical spine MRI. Within this limitation, probable demyelinating lesions in the dorsal cord at the C3-4 and T2 levels. No enhancement to suggest acute demyelination. 3. Cervical spondylosis, worst at C5-C6, where there is mild spinal canal stenosis and severe bilateral neural foraminal narrowing. Electronically Signed: By: Orvan Falconer M.D. On: 03/27/2023 09:22   MR BRAIN WO CONTRAST  Result Date: 03/27/2023 CLINICAL DATA:  52 year old female with dizziness, bilateral hand numbness, neurologic deficit. Right leg symptoms for 3 days. EXAM: MRI HEAD WITHOUT CONTRAST TECHNIQUE: Multiplanar, multiecho pulse sequences of the brain and surrounding structures were obtained without intravenous contrast. COMPARISON:  Head CT last night. FINDINGS: Brain: Normal cerebral volume. No restricted diffusion to suggest acute infarction. No midline shift, mass effect, evidence of mass lesion, ventriculomegaly, extra-axial collection or acute intracranial hemorrhage. Cervicomedullary junction and pituitary are within normal limits. But there are bilateral scattered, nodular foci of T2 and FLAIR hyperintensity with facilitated diffusion in the periventricular white matter (series 7, image 35) and some of these appear oriented perpendicular to the ventricles.  There is additional involvement of the left corona radiata and external capsule on that side. Conspicuous bilateral temporal lobe periventricular involvement (on the right series 7, image 21). Deep gray nuclei, brainstem, and cerebellum relatively spared. None of the foci are restricted. No cortical involvement or encephalomalacia identified. No chronic cerebral blood products on SWI. Vascular: Major intracranial vascular flow voids are preserved. Skull and upper cervical spine: Negative visible cervical spine. Normal bone marrow signal. Sinuses/Orbits: Orbit motion artifact, grossly normal. Paranasal sinuses and mastoids are stable and well aerated. Other: Grossly negative visible internal auditory structures. Negative visible scalp and face. IMPRESSION: Bilateral, nodular T2/FLAIR hyperintense cerebral white matter lesions in a configuration strongly suggesting Multiple Sclerosis. No evidence of active demyelination on this noncontrast exam. Electronically Signed   By: Odessa Fleming M.D.   On: 03/27/2023 04:19   CT HEAD WO CONTRAST ( )  Result Date: 03/26/2023 CLINICAL DATA:  Dizziness and bilateral hand numbness EXAM: CT HEAD WITHOUT CONTRAST TECHNIQUE: Contiguous axial images were obtained from the base of the skull through the vertex without intravenous contrast. RADIATION DOSE REDUCTION: This exam was performed according to the departmental dose-optimization program which includes automated exposure control, adjustment of the mA and/or kV according to patient size and/or use of iterative reconstruction technique. COMPARISON:  None Available. FINDINGS: Brain: No evidence of acute infarction, hemorrhage, mass, mass effect, or midline shift. No hydrocephalus or extra-axial fluid collection. Partial empty sella. Normal craniocervical junction.  Vascular: No hyperdense vessel. Skull: Negative for fracture or focal lesion. Sinuses/Orbits: The imaged paranasal sinuses are clear. Small osteoma in the left ethmoid air  cells. No acute finding in the orbits. Other: The mastoid air cells are well aerated. IMPRESSION: No acute intracranial process. Electronically Signed   By: Wiliam Ke M.D.   On: 03/26/2023 23:29    Pending Labs Unresulted Labs (From admission, onward)     Start     Ordered   03/28/23 0500  CBC  Tomorrow morning,   R        03/27/23 1010   03/28/23 0500  Basic metabolic panel  Tomorrow morning,   R        03/27/23 1010   03/27/23 0957  HIV Antibody (routine testing w rflx)  (HIV Antibody (Routine testing w reflex) panel)  Once,   R        03/27/23 1010            Vitals/Pain Today's Vitals   03/27/23 0736 03/27/23 1145 03/27/23 1228 03/27/23 1427  BP: (!) 136/97   (!) 118/98  Pulse: 82   77  Resp: 15   18  Temp: 98 F (36.7 C)  98.2 F (36.8 C) 98 F (36.7 C)  TempSrc:   Oral Oral  SpO2: 99%   100%  Weight:      Height:      PainSc:  Asleep      Isolation Precautions Airborne and Contact precautions  Medications Medications  methylPREDNISolone sodium succinate (SOLU-MEDROL) 1,000 mg in sodium chloride 0.9 % 50 mL IVPB (0 mg Intravenous Stopped 03/27/23 1631)  enoxaparin (LOVENOX) injection 40 mg (40 mg Subcutaneous Given 03/27/23 1533)  sodium chloride flush (NS) 0.9 % injection 3 mL (3 mLs Intravenous Given 03/27/23 1325)  acetaminophen (TYLENOL) tablet 650 mg (650 mg Oral Given 03/27/23 1324)    Or  acetaminophen (TYLENOL) suppository 650 mg ( Rectal See Alternative 03/27/23 1324)  ondansetron (ZOFRAN) tablet 4 mg (has no administration in time range)    Or  ondansetron (ZOFRAN) injection 4 mg (has no administration in time range)  albuterol (PROVENTIL) (2.5 MG/3ML) 0.083% nebulizer solution 2.5 mg (has no administration in time range)  hydrALAZINE (APRESOLINE) injection 10 mg (has no administration in time range)  pantoprazole (PROTONIX) EC tablet 40 mg (40 mg Oral Given 03/27/23 1323)  amLODipine (NORVASC) tablet 5 mg (has no administration in time range)   irbesartan (AVAPRO) tablet 150 mg (150 mg Oral Given 03/27/23 1324)  hydrochlorothiazide (HYDRODIURIL) tablet 12.5 mg (12.5 mg Oral Given 03/27/23 1323)  LORazepam (ATIVAN) injection 2 mg (2 mg Intravenous Given 03/27/23 0337)  acetaminophen (TYLENOL) tablet 1,000 mg (1,000 mg Oral Given 03/27/23 0235)  LORazepam (ATIVAN) injection 1 mg (1 mg Intravenous Given 03/27/23 0745)  gadobutrol (GADAVIST) 1 MMOL/ML injection 9 mL (9 mLs Intravenous Contrast Given 03/27/23 0853)  potassium chloride SA (KLOR-CON M) CR tablet 40 mEq (40 mEq Oral Given 03/27/23 1325)    Mobility walks with person assist     Focused Assessments    R Recommendations: See Admitting Provider Note  Report given to:   Additional Notes:

## 2023-03-27 NOTE — ED Provider Notes (Signed)
  Physical Exam  BP (!) 136/97   Pulse 82   Temp 98 F (36.7 C)   Resp 15   Ht 5\' 6"  (1.676 m)   Wt 92.1 kg   SpO2 99%   BMI 32.77 kg/m   Physical Exam Vitals and nursing note reviewed.  Constitutional:      Appearance: Normal appearance.  HENT:     Head: Normocephalic and atraumatic.     Mouth/Throat:     Mouth: Mucous membranes are dry.  Cardiovascular:     Rate and Rhythm: Normal rate.  Pulmonary:     Effort: Pulmonary effort is normal.  Abdominal:     General: Abdomen is flat.  Musculoskeletal:     Cervical back: Normal range of motion and neck supple.  Skin:    General: Skin is warm and dry.  Neurological:     Mental Status: She is alert and oriented to person, place, and time.     Procedures  Procedures  ED Course / MDM    Medical Decision Making Amount and/or Complexity of Data Reviewed Labs: ordered. Radiology: ordered.  Risk OTC drugs. Prescription drug management. Decision regarding hospitalization.   Patient care to assume from Titus Dubin.  PA at shift change, please see his note for full HPI.  Briefly patient here with right arm weakness, subjective left arm weakness for the past 2 to 3 days, no prior neurological diagnoses.  Had MRI here concerning for demyelination process.  Neurology has been looped and they recommended MRI with contrast.  Patient was going to be admitted to the hospitalist service however refused admission then until she had the MRI with contrast back.  Plan is for pending repeat MRI, reassess along with admission via hospitalist for new onset of multiple sclerosis.  MRI Brain with contrast showed: 1. Multiple T2 hyperintense lesions in the juxtacortical and  periventricular white matter, suggestive of demyelinating disease.  No enhancement to suggest acute demyelination.  2. Moderately motion degraded cervical spine MRI. Within this  limitation, probable demyelinating lesions in the dorsal cord at the  C3-4 and T2 levels. No  enhancement to suggest acute demyelination.  3. Cervical spondylosis, worst at C5-C6, where there is mild spinal  canal stenosis and severe bilateral neural foraminal narrowing.   9:55 AM Case discussed with neurology Dr. Amada Jupiter, which after reviewing images but feels that patient will also benefit for admission at this time.  She also requires Solu-Medrol 1 g for the next 3 days.  She will be evaluated by neurology upon admission.  I did speak to the hospitalist service Dr. Egbert Garibaldi who agrees on admission at this time.   Portions of this note were generated with Scientist, clinical (histocompatibility and immunogenetics). Dictation errors may occur despite best attempts at proofreading.       Claude Manges, PA-C 03/27/23 8295    Shon Baton, MD 03/31/23 [phone]

## 2023-03-27 NOTE — Consult Note (Addendum)
NEURO HOSPITALIST CONSULT NOTE   Requestig physician: Dr. Wilkie Aye  Reason for Consult: Right sided weakness and gait unsteadiness  History obtained from:   Patient and Chart     HPI:                                                                                                                                          Jamie Watson is a 52 y.o. female with a PMHx of chronic migraine headaches, fatigue, HTN, irregular heart beat, snoring and seasonal allergies who presented to the ED on Wednesday night with a 2-3 day history of malaise, dizziness, numbness to her left hand > right, right hand incoordination, right arm and hand weakness, and staggering gait due to RLE weakness and sensory numbness. MRI brain w/o contrast revealed multiple white matter lesions suggestive of possible demyelination.   The patient also endorses a several-year history of intermittent dizziness and gait unsteadiness that would resolve spontaneously. Has had gradually worsening visual acuity, worse on the left, described as visual blurring. 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   Past Medical History:  Diagnosis Date   Acute pain of left knee 04/19/2019   Chronic headaches    Fatigue    Hypertension    Irregular heart beat    Menopause    Migraine    Right arm pain 04/19/2019   Seasonal allergies    Snoring    Weight gain     Past Surgical History:  Procedure Laterality Date   TONSILLECTOMY AND ADENOIDECTOMY      Family History  Problem Relation Age of Onset   Heart disease Mother    Atrial fibrillation Mother    Cancer Father        Brain   Diabetes Father    Stroke Maternal Aunt    Cancer Maternal Aunt        Stomach   Diabetes Maternal Uncle    Diabetes Maternal Uncle    Diabetes Maternal Uncle    Diabetes Maternal Grandmother    Stroke Maternal Grandmother    Heart disease Maternal Grandfather               Social  History:  reports that she quit smoking about 12 years ago. Her smoking use included cigarettes. She has never used smokeless tobacco. She reports current alcohol use. She reports current drug use. Drug: Marijuana.  No Known Allergies  MEDICATIONS:  No current facility-administered medications on file prior to encounter.   Current Outpatient Medications on File Prior to Encounter  Medication Sig Dispense Refill   amLODipine (NORVASC) 5 MG tablet Take 1 tablet (5 mg total) by mouth at bedtime. 90 tablet 3   valsartan-hydrochlorothiazide (DIOVAN-HCT) 160-12.5 MG tablet Take 1 tablet by mouth daily. 90 tablet 3   Blood Pressure Monitoring (OMRON 3 SERIES BP MONITOR) DEVI USE AS DIRECTED 1 each 0    Scheduled: Continuous:   ROS:                                                                                                                                       As per HPI.    Blood pressure (!) 136/97, pulse 82, temperature 98 F (36.7 C), resp. rate 15, height 5\' 6"  (1.676 m), weight 92.1 kg, SpO2 99%.   General Examination:                                                                                                       Physical Exam  HEENT-  [place]/AT    Lungs- Respirations unlabored Extremities- No edema  Neurological Examination Mental Status: Alert, oriented x 5, thought content appropriate.  Speech fluent without evidence of aphasia.  Able to follow all commands without difficulty. Cranial Nerves: II: Temporal visual fields intact with no extinction to DSS. PERRL No RAPD.  III,IV, VI: No ptosis. EOMI. Slight intermittent horizontal nystagmus on left gaze.  V: Temp sensation equal bilaterally  VII: Smile symmetric VIII: Hearing intact to voice IX,X: No hoarseness XI: Symmetric shoulder shrug XII: Midline tongue extension Motor: RUE 4/5 proximally, 4-/5 grip.   LUE 5/5 RLE 4+/5 proximally and distally LLE 5/5 Pronator drift is present on the right  Sensory: Temp and light touch intact throughout, bilaterally. No extinction to DSS.  Deep Tendon Reflexes: 2+ LUE, 1+ RUE, 2+ bilateral patellae. Clonus on dorsiflexion of right foot. Toes mute.  Cerebellar: No ataxia with FNF bilaterally  Gait: Able to stand and walk with own power. Decreased stride length bilaterally, worse on the right. Negative Romberg.     Lab Results: Basic Metabolic Panel: Recent Labs  Lab 03/26/23 2223  NA 139  K 3.3*  CL 104  CO2 25  GLUCOSE 105*  BUN 11  CREATININE 0.85  CALCIUM 9.4    CBC: Recent Labs  Lab 03/26/23 2223  WBC 4.7  NEUTROABS 1.8  HGB 13.5  HCT 40.2  MCV 89.7  PLT  257    Cardiac Enzymes: No results for input(s): "CKTOTAL", "CKMB", "CKMBINDEX", "TROPONINI" in the last 168 hours.  Lipid Panel: No results for input(s): "CHOL", "TRIG", "HDL", "CHOLHDL", "VLDL", "LDLCALC" in the last 168 hours.  Imaging: MR BRAIN WO CONTRAST  Result Date: 03/27/2023 CLINICAL DATA:  52 year old female with dizziness, bilateral hand numbness, neurologic deficit. Right leg symptoms for 3 days. EXAM: MRI HEAD WITHOUT CONTRAST TECHNIQUE: Multiplanar, multiecho pulse sequences of the brain and surrounding structures were obtained without intravenous contrast. COMPARISON:  Head CT last night. FINDINGS: Brain: Normal cerebral volume. No restricted diffusion to suggest acute infarction. No midline shift, mass effect, evidence of mass lesion, ventriculomegaly, extra-axial collection or acute intracranial hemorrhage. Cervicomedullary junction and pituitary are within normal limits. But there are bilateral scattered, nodular foci of T2 and FLAIR hyperintensity with facilitated diffusion in the periventricular white matter (series 7, image 35) and some of these appear oriented perpendicular to the ventricles. There is additional involvement of the left corona radiata and  external capsule on that side. Conspicuous bilateral temporal lobe periventricular involvement (on the right series 7, image 21). Deep gray nuclei, brainstem, and cerebellum relatively spared. None of the foci are restricted. No cortical involvement or encephalomalacia identified. No chronic cerebral blood products on SWI. Vascular: Major intracranial vascular flow voids are preserved. Skull and upper cervical spine: Negative visible cervical spine. Normal bone marrow signal. Sinuses/Orbits: Orbit motion artifact, grossly normal. Paranasal sinuses and mastoids are stable and well aerated. Other: Grossly negative visible internal auditory structures. Negative visible scalp and face. IMPRESSION: Bilateral, nodular T2/FLAIR hyperintense cerebral white matter lesions in a configuration strongly suggesting Multiple Sclerosis. No evidence of active demyelination on this noncontrast exam. Electronically Signed   By: Odessa Fleming M.D.   On: 03/27/2023 04:19   CT HEAD WO CONTRAST ( )  Result Date: 03/26/2023 CLINICAL DATA:  Dizziness and bilateral hand numbness EXAM: CT HEAD WITHOUT CONTRAST TECHNIQUE: Contiguous axial images were obtained from the base of the skull through the vertex without intravenous contrast. RADIATION DOSE REDUCTION: This exam was performed according to the departmental dose-optimization program which includes automated exposure control, adjustment of the mA and/or kV according to patient size and/or use of iterative reconstruction technique. COMPARISON:  None Available. FINDINGS: Brain: No evidence of acute infarction, hemorrhage, mass, mass effect, or midline shift. No hydrocephalus or extra-axial fluid collection. Partial empty sella. Normal craniocervical junction. Vascular: No hyperdense vessel. Skull: Negative for fracture or focal lesion. Sinuses/Orbits: The imaged paranasal sinuses are clear. Small osteoma in the left ethmoid air cells. No acute finding in the orbits. Other: The mastoid air  cells are well aerated. IMPRESSION: No acute intracranial process. Electronically Signed   By: Wiliam Ke M.D.   On: 03/26/2023 23:29     Assessment: 52 y.o. female with a PMHx of chronic migraine headaches, fatigue, HTN, irregular heart beat, snoring and seasonal allergies who presented to the ED on Wednesday night with a 2-3 day history of malaise, dizziness, numbness to her left hand > right, right hand incoordination, right arm and hand weakness, and staggering gait due to RLE weakness and sensory numbness. MRI brain w/o contrast revealed multiple white matter lesions suggestive of possible demyelination.  - Exam reveals RUE weakness, worse distally, mild RLE weakness and mild gait unsteadiness.  - CT head: No acute intracranial process. - MRI brain w/o contrast: Bilateral, nodular T2/FLAIR hyperintense cerebral white matter lesions in a configuration strongly suggesting Multiple Sclerosis. No evidence of active demyelination on this noncontrast  exam -   Recommendations: - MRI brain with contrast - MRI cervical spine w/wo contrast - PT/OT - Lupus panel. RPR, ESR. Serum anti-NMO antibody.  - Further recommendations pending the above MRI results.    Electronically signed: Dr. Caryl Pina 03/27/2023, 8:41 AM

## 2023-03-28 DIAGNOSIS — I10 Essential (primary) hypertension: Secondary | ICD-10-CM | POA: Diagnosis not present

## 2023-03-28 DIAGNOSIS — G35 Multiple sclerosis: Secondary | ICD-10-CM | POA: Diagnosis not present

## 2023-03-28 DIAGNOSIS — E876 Hypokalemia: Secondary | ICD-10-CM | POA: Diagnosis not present

## 2023-03-28 LAB — CBC
HCT: 46.1 % — ABNORMAL HIGH (ref 36.0–46.0)
Hemoglobin: 15.2 g/dL — ABNORMAL HIGH (ref 12.0–15.0)
MCH: 29.7 pg (ref 26.0–34.0)
MCHC: 33 g/dL (ref 30.0–36.0)
MCV: 90 fL (ref 80.0–100.0)
Platelets: 282 10*3/uL (ref 150–400)
RBC: 5.12 MIL/uL — ABNORMAL HIGH (ref 3.87–5.11)
RDW: 13.1 % (ref 11.5–15.5)
WBC: 11.6 10*3/uL — ABNORMAL HIGH (ref 4.0–10.5)
nRBC: 0 % (ref 0.0–0.2)

## 2023-03-28 LAB — BASIC METABOLIC PANEL
Anion gap: 15 (ref 5–15)
BUN: 11 mg/dL (ref 6–20)
CO2: 17 mmol/L — ABNORMAL LOW (ref 22–32)
Calcium: 9.7 mg/dL (ref 8.9–10.3)
Chloride: 105 mmol/L (ref 98–111)
Creatinine, Ser: 0.75 mg/dL (ref 0.44–1.00)
GFR, Estimated: >60 mL/min (ref >60)
Glucose, Bld: 136 mg/dL — ABNORMAL HIGH (ref 70–99)
Potassium: 4.2 mmol/L (ref 3.5–5.1)
Sodium: 137 mmol/L (ref 135–145)

## 2023-03-28 LAB — GLUCOSE, CAPILLARY
Glucose-Capillary: 156 mg/dL — ABNORMAL HIGH (ref 70–99)
Glucose-Capillary: 131 mg/dL — ABNORMAL HIGH (ref 70–99)
Glucose-Capillary: 232 mg/dL — ABNORMAL HIGH (ref 70–99)
Glucose-Capillary: 182 mg/dL — ABNORMAL HIGH (ref 70–99)

## 2023-03-28 MED ORDER — INSULIN ASPART 100 UNIT/ML IJ SOLN
0.0000 [IU] | Freq: Three times a day (TID) | INTRAMUSCULAR | Status: DC
  Administered 2023-03-28: 2 [IU] via SUBCUTANEOUS
  Administered 2023-03-29: 1 [IU] via SUBCUTANEOUS
  Administered 2023-03-30: 2 [IU] via SUBCUTANEOUS
  Administered 2023-03-30: 1 [IU] via SUBCUTANEOUS

## 2023-03-28 NOTE — Plan of Care (Signed)

## 2023-03-28 NOTE — Progress Notes (Signed)
Subjective: Patient feels relatively same as yesterday.  Exam: Vitals:   03/28/23 0415 03/28/23 0825  BP:  (!) 127/109  Pulse: (!) 108 98  Resp: 18 12  Temp:    SpO2: 100% 100%   Gen: In bed, NAD Resp: non-labored breathing, no acute distress Abd: soft, nt  Neuro: MS: Awake, alert, interactive and appropriate CN: Visual fields are full, extraocular movements are intact Motor: She has significant weakness in her right upper extremity, proximal greater than distal, her right lower extremity is better than her upper, but also has some mild weakness Sensory: She reports reduced sensation bilaterally   Impression: 52 year old female with numbness and weakness which coupled with her MRI findings is most consistent with multiple sclerosis.  Recommendations: 1) IV Solu-Medrol 1 g daily for 3 to 5 days 2) PT, OT 3) she will need outpatient follow-up for disease modifying therapy  Ritta Slot, MD [facility] [phone]  If 7pm- 7am, please page neurology on call as listed in AMION.

## 2023-03-28 NOTE — Progress Notes (Signed)
Mobility Specialist Progress Note;   03/28/23 1455  Mobility  Activity Ambulated with assistance in hallway  Level of Assistance Contact guard assist, steadying assist  Assistive Device Other (Comment) (IV pole)  Distance Ambulated (ft) 400 ft  Activity Response Tolerated well  Mobility Referral Yes  $Mobility charge 1 Mobility  Mobility Specialist Start Time (ACUTE ONLY) 1455  Mobility Specialist Stop Time (ACUTE ONLY) 1510  Mobility Specialist Time Calculation (min) (ACUTE ONLY) 15 min   Pt agreeable to mobility. Required MinG assistance. Pt verbalized she had no sensation on R side of body. Minor R knee buckling noted, no LOB throughout. Pt was left in the chair with all needs met.  Caesar Bookman Mobility Specialist Please contact via SecureChat or Rehab Office [phone]

## 2023-03-28 NOTE — Progress Notes (Signed)
   03/28/23 1305  Spiritual Encounters  Type of Visit Initial  Care provided to: Patient  Referral source Patient request  Reason for visit Routine spiritual support  OnCall Visit No  Spiritual Framework  Presenting Themes Values and beliefs;Significant life change;Rituals and practive  Interventions  Spiritual Care Interventions Made Established relationship of care and support;Compassionate presence;Reflective listening;Prayer  Intervention Outcomes  Outcomes Connection to spiritual care;Reduced isolation   Responded to request for prayer. Engaged in reflective listening. Provided patient with a Bible as requested and journal. Prayed with patient.

## 2023-03-28 NOTE — Progress Notes (Signed)
Neurology Progress Note (PA STUDENT)  Patient Name: Jamie Watson MRN: [id number] DOB: 17-Jan-1971 DOA: 03/27/2023 Requesting Provider: Jeoffrey Massed MD  HPI:  Ms. Kniess is a 52 year old female with a past medical history significant for hypertension, migraines, and extremity weakness that presented to the ED with right upper extremity weakness, gait disturbances, and dizziness. She notes on 9/18 she was not able to sign her name with her right hand due to numbness and had trouble walking due to her right foot feeling weak. An MRI of the brain and spine were done in the ED, revealing multiple enhancing and nonenhancing lesions and patient was admitted to the hospital for further workup.   Subjective:  Patient sitting in the chair comfortably and notes no new significant changes overnight. She notes that she has decreased sensation in the left hand and is still feeling unsteady when walking to the bathroom. Patient denies any headaches, nausea, vomiting, fever, urinary incontinence.   Exam:  Blood pressure (!) 127/109, pulse 98, temperature 98.3 F (36.8 C), temperature source Oral, resp. rate 12, height 5\' 6"  (1.676 m), weight 92.1 kg, SpO2 100%. General: 52 year old female in no acute distress Resp: non-labored breathing, no acute distress Abd: soft, non-tender  Neuro:  MS: Patient is awake, alert, and oriented x4. Speech is fluent, naming and repetition intact.  CN: Pupils are equal round reactive to light, visual fields full to confrontation. No nystagmus. No facial asymmetry. Normal hearing to speech.  Motor: 4/5 strength in RUE and RLE. 5/5 strength in left.  Sensation: Decreased sensation to normal touch, pinprick, and temperature to right and left upper extremity.   Pertinent Labs:   CBC    Component Value Date/Time   WBC 11.6 (H) 03/28/2023 0802   RBC 5.12 (H) 03/28/2023 0802   HGB 15.2 (H) 03/28/2023 0802   HCT 46.1 (H) 03/28/2023 0802   PLT 282 03/28/2023 0802   MCV 90.0  03/28/2023 0802   MCH 29.7 03/28/2023 0802   MCHC 33.0 03/28/2023 0802   RDW 13.1 03/28/2023 0802   LYMPHSABS 2.5 03/26/2023 2223   MONOABS 0.3 03/26/2023 2223   EOSABS 0.1 03/26/2023 2223   BASOSABS 0.0 03/26/2023 2223   CMP     Component Value Date/Time   NA 139 03/26/2023 2223   K 3.3 (L) 03/26/2023 2223   CL 104 03/26/2023 2223   CO2 25 03/26/2023 2223   GLUCOSE 105 (H) 03/26/2023 2223   BUN 11 03/26/2023 2223   CREATININE 0.85 03/26/2023 2223   CALCIUM 9.4 03/26/2023 2223   PROT 7.7 03/26/2023 2223   ALBUMIN 4.4 03/26/2023 2223   AST 30 03/26/2023 2223   ALT 30 03/26/2023 2223   ALKPHOS 57 03/26/2023 2223   BILITOT 0.8 03/26/2023 2223   GFR 90.60 05/15/2022 1122   GFRNONAA >60 03/26/2023 2223   Imaging:  MR BRAIN & SPINE W CONTRAST- 03/27/23 Impression: 1. Multiple T2 hyperintense lesions in the juxtacortical and periventricular white matter, suggestive of demyelinating disease. 2.There is a faint blush of enhancement in the right hemicord at the C3 level (sagittal image 7 series 14, axial image 12 series 15), suspicious for acute demyelination.   Assessment:  Patient is a 52 year old female with a past medical history significant for hypertension, migraines, vision changes, and upper and lower extremity weakness presented with acute worsening right sided weakness, left hand numbness and gait abnormalities which coupled with her MRI findings makes the diagnosis most consistent with new onset MS diagnosis with  acute flare.   MRI of the brain and spine revealed multiple hyperdense lesions concerning for chronic and acute demyelination. In addition, one lesion found in juxtacortical region, three in periventricular region, and one in spinal cord. These findings fulfill McDonald's criteria in diagnosis of MS. Less likely lesions could be result of small vessel disease, inflammatory disorder, or other autoimmune-mediated disorder.   Plan:  -Continue treatment with IV  solumedrol daily for 3-5 days  -Will refer to outpatient neurology for further medication management, provided educational resources for MS -PT/OT consult   Richard Miu PA-S 03/28/23

## 2023-03-28 NOTE — Evaluation (Signed)
Occupational Therapy Evaluation Patient Details Name: Jamie Watson MRN: [id number] DOB: 05/06/1971 Today's Date: 03/28/2023   History of Present Illness 52yo female who presented to the ED on 9/18 due to increased malaise, dizziness, B hand weakness, R UE weakness, gait deviations due to R LE weakness/numbness, and vision changes in L eye >R eye. MRI with contrast shows multiple white matter lesions suggestive of possible demyelination. She endorses similar sx have come intermittently over the past several years.Admitted for further w/u. 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 Pt presents now with deficits in strength of R dominant UE, RLE, coordination and standing balance. Pt requires CGA for mobility, improving steadiness when utilizing UE support. Pt requires no more than Min A for ADLs due to impaired RUE use. Provided education and materials re: energy conservation, built up grips for utensils, fine motor HEP, squeeze sponge and theraputty. Recommend follow up OT at DC to maximize independence in home environment.       If plan is discharge home, recommend the following: A little help with bathing/dressing/bathroom;Assistance with cooking/housework;Assist for transportation;Help with stairs or ramp for entrance    Functional Status Assessment  Patient has had a recent decline in their functional status and demonstrates the ability to make significant improvements in function in a reasonable and predictable amount of time.  Equipment Recommendations  Other (comment) (RW)    Recommendations for Other Services       Precautions / Restrictions Precautions Precautions: Fall Restrictions Weight Bearing Restrictions: No      Mobility Bed Mobility               General bed mobility comments: in chair on entry    Transfers Overall transfer level: Needs assistance Equipment  used: None Transfers: Sit to/from Stand Sit to Stand: Contact guard assist                  Balance Overall balance assessment: Needs assistance Sitting-balance support: Feet unsupported, Bilateral upper extremity supported Sitting balance-Leahy Scale: Good     Standing balance support: No upper extremity supported, During functional activity Standing balance-Leahy Scale: Fair                             ADL either performed or assessed with clinical judgement   ADL Overall ADL's : Needs assistance/impaired Eating/Feeding: Set up Eating/Feeding Details (indicate cue type and reason): reports difficulty using R hand for this task; had to use L hand. provided built up grip to try on utensils Grooming: Contact guard assist;Standing;Wash/dry hands Grooming Details (indicate cue type and reason): reports using L hand to brush teeth standing at sink due to difficulties with R hand earlier Upper Body Bathing: Supervision/ safety   Lower Body Bathing: Minimal assistance;Sitting/lateral leans;Sit to/from stand   Upper Body Dressing : Supervision/safety   Lower Body Dressing: Minimal assistance;Sitting/lateral leans;Sit to/from Careers adviser Details (indicate cue type and reason): LOB without UE support, improved with IV pole though unsteadiness still noted Toileting- Clothing Manipulation and Hygiene: Contact guard assist;Sitting/lateral lean;Sit to/from stand Toileting - Clothing Manipulation Details (indicate cue type and reason): for safety, able to manage clothing     Functional mobility during ADLs: Contact guard assist General ADL Comments: Focus on energy conservation education, fine motor and hand strength HEP with multiple handouts provided. issued  theraputty and squeeze sponge     Vision Ability to See in Adequate Light: 1 Impaired Patient Visual Report: Other (comment) (some L eye impairments, to be further  assessed) Vision Assessment?: Vision impaired- to be further tested in functional context Additional Comments: to be further assessed; L eye impairments on PT eval, to be further assessed in next OT session     Perception         Praxis         Pertinent Vitals/Pain Pain Assessment Pain Assessment: No/denies pain     Extremity/Trunk Assessment Upper Extremity Assessment Upper Extremity Assessment: Right hand dominant;RUE deficits/detail RUE Deficits / Details: weaker than LUE, able to form grasp but reports difficulty holding on to utensils, toothbrush, etc. 3+/5 overall for biceps, triceps and shoulders. denies sensation changes. RUE Coordination: decreased fine motor   Lower Extremity Assessment Lower Extremity Assessment: Defer to PT evaluation   Cervical / Trunk Assessment Cervical / Trunk Assessment: Normal   Communication Communication Communication: No apparent difficulties   Cognition Arousal: Alert Behavior During Therapy: WFL for tasks assessed/performed Overall Cognitive Status: Within Functional Limits for tasks assessed                                       General Comments       Exercises     Shoulder Instructions      Home Living Family/patient expects to be discharged to:: Private residence Living Arrangements: Alone Available Help at Discharge: Family;Friend(s);Available 24 hours/day Type of Home: Apartment Home Access: Stairs to enter [employer] of Steps: lives on 3rd story, no Engineer, structural available Entrance Stairs-Rails: Right Home Layout: One level     Bathroom Shower/Tub: Chief Strategy Officer: Standard     Home Equipment: None          Prior Functioning/Environment Prior Level of Function : Independent/Modified Independent               ADLs Comments: works in lab at [employer]        OT Problem List: Decreased strength;Impaired balance (sitting and/or standing);Decreased activity  tolerance;Impaired vision/perception;Decreased coordination;Decreased knowledge of use of DME or AE;Impaired UE functional use      OT Treatment/Interventions: Self-care/ADL training;Therapeutic exercise;Energy conservation;DME and/or AE instruction;Therapeutic activities;Patient/family education;Balance training    OT Goals(Current goals can be found in the care plan section) Acute Rehab OT Goals Patient Stated Goal: maintain independence OT Goal Formulation: With patient Time For Goal Achievement: 04/18/23 Potential to Achieve Goals: Good  OT Frequency: Min 1X/week    Co-evaluation              AM-PAC OT "6 Clicks" Daily Activity     Outcome Measure Help from another person eating meals?: A Little Help from another person taking care of personal grooming?: A Little Help from another person toileting, which includes using toliet, bedpan, or urinal?: A Little Help from another person bathing (including washing, rinsing, drying)?: A Little Help from another person to put on and taking off regular upper body clothing?: A Little Help from another person to put on and taking off regular lower body clothing?: A Little 6 Click Score: 18   End of Session    Activity Tolerance: Patient tolerated treatment well Patient left: in chair;with call bell/phone within reach;with chair alarm set;with family/visitor present  OT Visit Diagnosis: Unsteadiness on feet (R26.81);Other abnormalities of gait and mobility (R26.89);Muscle  weakness (generalized) (M62.81)                Time: 7829-5621 OT Time Calculation (min): 28 min Charges:  OT General Charges $OT Visit: 1 Visit OT Evaluation $OT Eval Moderate Complexity: 1 Mod OT Treatments $Self Care/Home Management : 8-22 mins  Bradd Canary, OTR/L Acute Rehab Services Office: [phone]   Lorre Munroe 03/28/2023, 12:54 PM

## 2023-03-28 NOTE — Progress Notes (Addendum)
PROGRESS NOTE        PATIENT DETAILS Name: Jamie Watson Age: 52 y.o. Sex: female Date of Birth: Nov 12, 1970 Admit Date: 03/27/2023 Admitting Physician Clydie Braun, MD [id number], Bertram Millard, MD  Brief Summary: Patient is a 52 y.o.  female with history of HTN, chronic headaches-presented with decreased sensation in her left hand, left upper extremity weakness-along with incoordination of her right upper/right lower extremities-she was evaluated by neurology-underwent neuroimaging with MRI brain-and was thought to have new onset multiple sclerosis with flare.  She was started on IV steroids and subsequently admitted to the hospitalist service  Significant events: 9/19>>  Admit to [facility]  Significant studies: 9/20>> MRI brain/C-spine with/without contrast: Enhancement of right hemicord at C3 level suspicious for acute demyelination.  Multiple hyperintense T2 lesions in the juxtacortical and periventricular white matter suggestive of demyelinating disease without enhancement.  Significant microbiology data: 9/18>> COVID PCR: Negative  Procedures: None  Consults: Neurology  Subjective: Lying comfortably in bed-feels essentially unchanged compared to yesterday.  Objective: Vitals: Blood pressure (!) 127/109, pulse 98, temperature 98.3 F (36.8 C), temperature source Oral, resp. rate 12, height 5\' 6"  (1.676 m), weight 92.1 kg, SpO2 100%.   Exam: Gen Exam:Alert awake-not in any distress HEENT:atraumatic, normocephalic Chest: B/L clear to auscultation anteriorly CVS:S1S2 regular Abdomen:soft non tender, non distended Extremities:no edema Neurology: Non focal-RUE weakness -minimal-if at all. Skin: no rash  Pertinent Labs/Radiology:    Latest Ref Rng & Units 03/28/2023    8:02 AM 03/26/2023   10:23 PM 05/15/2022   11:22 AM  CBC  WBC 4.0 - 10.5 K/uL 11.6  4.7  4.9   Hemoglobin 12.0 - 15.0 g/dL 59.5  63.8  75.6   Hematocrit 36.0 - 46.0 % 46.1  40.2   39.9   Platelets 150 - 400 K/uL 282  257  232.0     Lab Results  Component Value Date   NA 139 03/26/2023   K 3.3 (L) 03/26/2023   CL 104 03/26/2023   CO2 25 03/26/2023      Assessment/Plan: Newly diagnosed MS with flare Is essentially the same-but no concerning motor weakness on exam-slight weakness on right upper extremity if at all. Neurology following On IV Solu-Medrol 1 g daily-neurology planning on 3-5 days of treatment. Will need outpatient neurology follow-up with Dr. Epimenio Foot PT/OT-await recommendations  HTN BP stable Continue amlodipine/irbesartan/HCTZ Follow-up.  Hypokalemia Repleted 9/19 Await a.m. labs.  Obesity: Estimated body mass index is 32.77 kg/m as calculated from the following:   Height as of this encounter: 5\' 6"  (1.676 m).   Weight as of this encounter: 92.1 kg.   Code status:   Code Status: Full Code   DVT Prophylaxis: enoxaparin (LOVENOX) injection 40 mg Start: 03/27/23 1400   Family Communication: None at bedside   Disposition Plan: Status is: Inpatient Remains inpatient appropriate because: Severity of illness   Planned Discharge Destination:Home   Diet: Diet Order             Diet regular Room service appropriate? Yes; Fluid consistency: Thin  Diet effective now                     Antimicrobial agents: Anti-infectives (From admission, onward)    None        MEDICATIONS: Scheduled Meds:  amLODipine  5 mg Oral QHS   enoxaparin (  LOVENOX) injection  40 mg Subcutaneous Q24H   hydrochlorothiazide  12.5 mg Oral Daily   irbesartan  150 mg Oral Daily   pantoprazole  40 mg Oral Daily   sodium chloride flush  3 mL Intravenous Q12H   Continuous Infusions:  methylPREDNISolone (SOLU-MEDROL) injection Stopped (03/27/23 1631)   PRN Meds:.acetaminophen **OR** acetaminophen, albuterol, hydrALAZINE, ondansetron **OR** ondansetron (ZOFRAN) IV   I have personally reviewed following labs and imaging studies  LABORATORY  DATA: CBC: Recent Labs  Lab 03/26/23 2223 03/28/23 0802  WBC 4.7 11.6*  NEUTROABS 1.8  --   HGB 13.5 15.2*  HCT 40.2 46.1*  MCV 89.7 90.0  PLT 257 282    Basic Metabolic Panel: Recent Labs  Lab 03/26/23 2223  NA 139  K 3.3*  CL 104  CO2 25  GLUCOSE 105*  BUN 11  CREATININE 0.85  CALCIUM 9.4    GFR: Estimated Creatinine Clearance: 88.5 mL/min (by C-G formula based on SCr of 0.85 mg/dL).  Liver Function Tests: Recent Labs  Lab 03/26/23 2223  AST 30  ALT 30  ALKPHOS 57  BILITOT 0.8  PROT 7.7  ALBUMIN 4.4   No results for input(s): "LIPASE", "AMYLASE" in the last 168 hours. No results for input(s): "AMMONIA" in the last 168 hours.  Coagulation Profile: Recent Labs  Lab 03/27/23 0226  INR 1.0    Cardiac Enzymes: No results for input(s): "CKTOTAL", "CKMB", "CKMBINDEX", "TROPONINI" in the last 168 hours.  BNP (last 3 results) No results for input(s): "PROBNP" in the last 8760 hours.  Lipid Profile: No results for input(s): "CHOL", "HDL", "LDLCALC", "TRIG", "CHOLHDL", "LDLDIRECT" in the last 72 hours.  Thyroid Function Tests: No results for input(s): "TSH", "T4TOTAL", "FREET4", "T3FREE", "THYROIDAB" in the last 72 hours.  Anemia Panel: No results for input(s): "VITAMINB12", "FOLATE", "FERRITIN", "TIBC", "IRON", "RETICCTPCT" in the last 72 hours.  Urine analysis:    Component Value Date/Time   COLORURINE YELLOW 03/27/2023 0226   APPEARANCEUR CLEAR 03/27/2023 0226   LABSPEC 1.025 03/27/2023 0226   PHURINE 7.0 03/27/2023 0226   GLUCOSEU NEGATIVE 03/27/2023 0226   HGBUR TRACE (A) 03/27/2023 0226   BILIRUBINUR NEGATIVE 03/27/2023 0226   KETONESUR NEGATIVE 03/27/2023 0226   PROTEINUR NEGATIVE 03/27/2023 0226   NITRITE NEGATIVE 03/27/2023 0226   LEUKOCYTESUR NEGATIVE 03/27/2023 0226    Sepsis Labs: Lactic Acid, Venous No results found for: "LATICACIDVEN"  MICROBIOLOGY: Recent Results (from the past 240 hour(s))  SARS Coronavirus 2 by RT PCR  (hospital order, performed in [facility] hospital lab) *cepheid single result test* Anterior Nasal Swab     Status: None   Collection Time: 03/26/23 11:45 PM   Specimen: Anterior Nasal Swab  Result Value Ref Range Status   SARS Coronavirus 2 by RT PCR NEGATIVE NEGATIVE Final    Comment: (NOTE) SARS-CoV-2 target nucleic acids are NOT DETECTED.  The SARS-CoV-2 RNA is generally detectable in upper and lower respiratory specimens during the acute phase of infection. The lowest concentration of SARS-CoV-2 viral copies this assay can detect is 250 copies / mL. A negative result does not preclude SARS-CoV-2 infection and should not be used as the sole basis for treatment or other patient management decisions.  A negative result may occur with improper specimen collection / handling, submission of specimen other than nasopharyngeal swab, presence of viral mutation(s) within the areas targeted by this assay, and inadequate number of viral copies (<250 copies / mL). A negative result must be combined with clinical observations, patient history, and epidemiological  information.  Fact Sheet for Patients:   RoadLapTop.co.za  Fact Sheet for Healthcare Providers: http://kim-miller.com/  This test is not yet approved or  cleared by the [place] FDA and has been authorized for detection and/or diagnosis of SARS-CoV-2 by FDA under an Emergency Use Authorization (EUA).  This EUA will remain in effect (meaning this test can be used) for the duration of the COVID-19 declaration under Section 564(b)(1) of the Act, 21 U.S.C. section 360bbb-3(b)(1), unless the authorization is terminated or revoked sooner.  Performed at [facility], [address] [place], [place] [address]     RADIOLOGY STUDIES/RESULTS: MR Cervical Spine W and Wo Contrast  Addendum Date: 03/27/2023   ADDENDUM REPORT: 03/27/2023 10:09 ADDENDUM: On further review, there  is a faint blush of enhancement in the right hemicord at the C3 level (sagittal image 7 series 14, axial image 12 series 15), suspicious for acute demyelination. INDICATION: Dizziness.  Headaches.  Right leg weakness. Electronically Signed   By: Orvan Falconer M.D.   On: 03/27/2023 10:09   Result Date: 03/27/2023 EXAM: MRI HEAD WITH CONTRAST MRI CERVICAL SPINE WITHOUT AND WITH CONTRAST TECHNIQUE: Multiplanar, multiecho pulse sequences of the brain and surrounding structures were obtained with intravenous contrast. Multiplanar, multi sequence imaging of the cervical spine, to include the craniocervical junction and cervicothoracic junction, were obtained without and with intravenous contrast. CONTRAST:  9mL GADAVIST GADOBUTROL 1 MMOL/ML IV SOLN COMPARISON:  MRI brain 03/27/2023. FINDINGS: MRI HEAD FINDINGS Brain: Multiple T2 hyperintense lesions in the juxtacortical and periventricular white matter, suggestive of demyelinating disease. No enhancement to suggest acute demyelination. Vascular: Normal flow voids and vessel enhancement. Skull and upper cervical spine: Normal marrow signal and enhancement. Sinuses/Orbits: No acute findings. Other: None. MRI CERVICAL SPINE FINDINGS Alignment: Normal. Vertebrae: No fracture, evidence of discitis, or bone lesion. Cord: Moderately motion degraded study. Within this limitation, probable demyelinating lesions in the dorsal cord at the C3-4 and T2 levels (sagittal image 9 series 5/9). No enhancement to suggest acute demyelination. Posterior Fossa, vertebral arteries, paraspinal tissues: Unremarkable. Disc levels: C2-C3:  Normal. C3-C4:  Normal. C4-C5: Right-greater-than-left facet arthropathy and uncovertebral joint spurring results in moderate right neural foraminal narrowing. C5-C6: Disc bulge results in mild spinal canal stenosis. Facet arthropathy and uncovertebral joint spurring results in severe bilateral neural foraminal narrowing. C6-C7:  Normal. C7-T1:  Normal.  IMPRESSION: 1. Multiple T2 hyperintense lesions in the juxtacortical and periventricular white matter, suggestive of demyelinating disease. No enhancement to suggest acute demyelination. 2. Moderately motion degraded cervical spine MRI. Within this limitation, probable demyelinating lesions in the dorsal cord at the C3-4 and T2 levels. No enhancement to suggest acute demyelination. 3. Cervical spondylosis, worst at C5-C6, where there is mild spinal canal stenosis and severe bilateral neural foraminal narrowing. Electronically Signed: By: Orvan Falconer M.D. On: 03/27/2023 09:22   MR BRAIN W CONTRAST  Addendum Date: 03/27/2023   ADDENDUM REPORT: 03/27/2023 10:09 ADDENDUM: On further review, there is a faint blush of enhancement in the right hemicord at the C3 level (sagittal image 7 series 14, axial image 12 series 15), suspicious for acute demyelination. INDICATION: Dizziness.  Headaches.  Right leg weakness. Electronically Signed   By: Orvan Falconer M.D.   On: 03/27/2023 10:09   Result Date: 03/27/2023 EXAM: MRI HEAD WITH CONTRAST MRI CERVICAL SPINE WITHOUT AND WITH CONTRAST TECHNIQUE: Multiplanar, multiecho pulse sequences of the brain and surrounding structures were obtained with intravenous contrast. Multiplanar, multi sequence imaging of the cervical spine, to  include the craniocervical junction and cervicothoracic junction, were obtained without and with intravenous contrast. CONTRAST:  9mL GADAVIST GADOBUTROL 1 MMOL/ML IV SOLN COMPARISON:  MRI brain 03/27/2023. FINDINGS: MRI HEAD FINDINGS Brain: Multiple T2 hyperintense lesions in the juxtacortical and periventricular white matter, suggestive of demyelinating disease. No enhancement to suggest acute demyelination. Vascular: Normal flow voids and vessel enhancement. Skull and upper cervical spine: Normal marrow signal and enhancement. Sinuses/Orbits: No acute findings. Other: None. MRI CERVICAL SPINE FINDINGS Alignment: Normal. Vertebrae: No fracture,  evidence of discitis, or bone lesion. Cord: Moderately motion degraded study. Within this limitation, probable demyelinating lesions in the dorsal cord at the C3-4 and T2 levels (sagittal image 9 series 5/9). No enhancement to suggest acute demyelination. Posterior Fossa, vertebral arteries, paraspinal tissues: Unremarkable. Disc levels: C2-C3:  Normal. C3-C4:  Normal. C4-C5: Right-greater-than-left facet arthropathy and uncovertebral joint spurring results in moderate right neural foraminal narrowing. C5-C6: Disc bulge results in mild spinal canal stenosis. Facet arthropathy and uncovertebral joint spurring results in severe bilateral neural foraminal narrowing. C6-C7:  Normal. C7-T1:  Normal. IMPRESSION: 1. Multiple T2 hyperintense lesions in the juxtacortical and periventricular white matter, suggestive of demyelinating disease. No enhancement to suggest acute demyelination. 2. Moderately motion degraded cervical spine MRI. Within this limitation, probable demyelinating lesions in the dorsal cord at the C3-4 and T2 levels. No enhancement to suggest acute demyelination. 3. Cervical spondylosis, worst at C5-C6, where there is mild spinal canal stenosis and severe bilateral neural foraminal narrowing. Electronically Signed: By: Orvan Falconer M.D. On: 03/27/2023 09:22   MR BRAIN WO CONTRAST  Result Date: 03/27/2023 CLINICAL DATA:  52 year old female with dizziness, bilateral hand numbness, neurologic deficit. Right leg symptoms for 3 days. EXAM: MRI HEAD WITHOUT CONTRAST TECHNIQUE: Multiplanar, multiecho pulse sequences of the brain and surrounding structures were obtained without intravenous contrast. COMPARISON:  Head CT last night. FINDINGS: Brain: Normal cerebral volume. No restricted diffusion to suggest acute infarction. No midline shift, mass effect, evidence of mass lesion, ventriculomegaly, extra-axial collection or acute intracranial hemorrhage. Cervicomedullary junction and pituitary are within  normal limits. But there are bilateral scattered, nodular foci of T2 and FLAIR hyperintensity with facilitated diffusion in the periventricular white matter (series 7, image 35) and some of these appear oriented perpendicular to the ventricles. There is additional involvement of the left corona radiata and external capsule on that side. Conspicuous bilateral temporal lobe periventricular involvement (on the right series 7, image 21). Deep gray nuclei, brainstem, and cerebellum relatively spared. None of the foci are restricted. No cortical involvement or encephalomalacia identified. No chronic cerebral blood products on SWI. Vascular: Major intracranial vascular flow voids are preserved. Skull and upper cervical spine: Negative visible cervical spine. Normal bone marrow signal. Sinuses/Orbits: Orbit motion artifact, grossly normal. Paranasal sinuses and mastoids are stable and well aerated. Other: Grossly negative visible internal auditory structures. Negative visible scalp and face. IMPRESSION: Bilateral, nodular T2/FLAIR hyperintense cerebral white matter lesions in a configuration strongly suggesting Multiple Sclerosis. No evidence of active demyelination on this noncontrast exam. Electronically Signed   By: Odessa Fleming M.D.   On: 03/27/2023 04:19   CT HEAD WO CONTRAST ( )  Result Date: 03/26/2023 CLINICAL DATA:  Dizziness and bilateral hand numbness EXAM: CT HEAD WITHOUT CONTRAST TECHNIQUE: Contiguous axial images were obtained from the base of the skull through the vertex without intravenous contrast. RADIATION DOSE REDUCTION: This exam was performed according to the departmental dose-optimization program which includes automated exposure control, adjustment of the mA and/or kV according to  patient size and/or use of iterative reconstruction technique. COMPARISON:  None Available. FINDINGS: Brain: No evidence of acute infarction, hemorrhage, mass, mass effect, or midline shift. No hydrocephalus or extra-axial  fluid collection. Partial empty sella. Normal craniocervical junction. Vascular: No hyperdense vessel. Skull: Negative for fracture or focal lesion. Sinuses/Orbits: The imaged paranasal sinuses are clear. Small osteoma in the left ethmoid air cells. No acute finding in the orbits. Other: The mastoid air cells are well aerated. IMPRESSION: No acute intracranial process. Electronically Signed   By: Wiliam Ke M.D.   On: 03/26/2023 23:29     LOS: 1 day   Jeoffrey Massed, MD  [facility]    To contact the attending provider between 7A-7P or the covering provider during after hours 7P-7A, please log into the web site [email or web address] and access using universal [place] password for that web site. If you do not have the password, please call the hospital operator.  03/28/2023, 9:31 AM

## 2023-03-29 DIAGNOSIS — I10 Essential (primary) hypertension: Secondary | ICD-10-CM | POA: Diagnosis not present

## 2023-03-29 DIAGNOSIS — G35 Multiple sclerosis: Secondary | ICD-10-CM | POA: Diagnosis not present

## 2023-03-29 DIAGNOSIS — E876 Hypokalemia: Secondary | ICD-10-CM | POA: Diagnosis not present

## 2023-03-29 LAB — BASIC METABOLIC PANEL
Anion gap: 10 (ref 5–15)
BUN: 16 mg/dL (ref 6–20)
CO2: 26 mmol/L (ref 22–32)
Calcium: 9.7 mg/dL (ref 8.9–10.3)
Chloride: 102 mmol/L (ref 98–111)
Creatinine, Ser: 0.8 mg/dL (ref 0.44–1.00)
GFR, Estimated: >60 mL/min (ref >60)
Glucose, Bld: 142 mg/dL — ABNORMAL HIGH (ref 70–99)
Potassium: 3.4 mmol/L — ABNORMAL LOW (ref 3.5–5.1)
Sodium: 138 mmol/L (ref 135–145)

## 2023-03-29 LAB — GLUCOSE, CAPILLARY
Glucose-Capillary: 126 mg/dL — ABNORMAL HIGH (ref 70–99)
Glucose-Capillary: 123 mg/dL — ABNORMAL HIGH (ref 70–99)
Glucose-Capillary: 183 mg/dL — ABNORMAL HIGH (ref 70–99)
Glucose-Capillary: 188 mg/dL — ABNORMAL HIGH (ref 70–99)

## 2023-03-29 LAB — HEMOGLOBIN A1C
Hgb A1c MFr Bld: 5.8 % — ABNORMAL HIGH (ref 4.8–5.6)
Mean Plasma Glucose: 119.76 mg/dL

## 2023-03-29 LAB — RPR: RPR Ser Ql: NONREACTIVE

## 2023-03-29 LAB — ANA W/REFLEX IF POSITIVE: Anti Nuclear Antibody (ANA): NEGATIVE

## 2023-03-29 MED ORDER — POTASSIUM CHLORIDE CRYS ER 20 MEQ PO TBCR
40.0000 meq | EXTENDED_RELEASE_TABLET | Freq: Once | ORAL | Status: AC
  Administered 2023-03-29: 40 meq via ORAL
  Filled 2023-03-29: qty 2

## 2023-03-29 NOTE — Progress Notes (Signed)
PROGRESS NOTE        PATIENT DETAILS Name: Jamie Watson Age: 52 y.o. Sex: female Date of Birth: Aug 06, 1970 Admit Date: 03/27/2023 Admitting Physician Clydie Braun, MD ZOX:WRUE, Bertram Millard, MD  Brief Summary: Patient is a 52 y.o.  female with history of HTN, chronic headaches-presented with decreased sensation in her left hand, left upper extremity weakness-along with incoordination of her right upper/right lower extremities-she was evaluated by neurology-underwent neuroimaging with MRI brain-and was thought to have new onset multiple sclerosis with flare.  She was started on IV steroids and subsequently admitted to the hospitalist service  Significant events: 9/19>>  Admit to [facility]  Significant studies: 9/20>> MRI brain/C-spine with/without contrast: Enhancement of right hemicord at C3 level suspicious for acute demyelination.  Multiple hyperintense T2 lesions in the juxtacortical and periventricular white matter suggestive of demyelinating disease without enhancement.  Significant microbiology data: 9/18>> COVID PCR: Negative  Procedures: None  Consults: Neurology  Subjective: Feels somewhat better-able to lift RUE better today compared to the past several days.  Objective: Vitals: Blood pressure 123/77, pulse 90, temperature 98.2 F (36.8 C), temperature source Oral, resp. rate 19, height 5\' 6"  (1.676 m), weight 92.1 kg, SpO2 97%.   Exam: Gen Exam:Alert awake-not in any distress HEENT:atraumatic, normocephalic Chest: B/L clear to auscultation anteriorly CVS:S1S2 regular Abdomen:soft non tender, non distended Extremities:no edema Neurology: Minimal RUE weakness  skin: no rash  Pertinent Labs/Radiology:    Latest Ref Rng & Units 03/28/2023    8:02 AM 03/26/2023   10:23 PM 05/15/2022   11:22 AM  CBC  WBC 4.0 - 10.5 K/uL 11.6  4.7  4.9   Hemoglobin 12.0 - 15.0 g/dL 45.4  09.8  11.9   Hematocrit 36.0 - 46.0 % 46.1  40.2  39.9   Platelets  150 - 400 K/uL 282  257  232.0     Lab Results  Component Value Date   NA 138 03/29/2023   K 3.4 (L) 03/29/2023   CL 102 03/29/2023   CO2 26 03/29/2023      Assessment/Plan: Newly diagnosed MS with flare Some improvement after IV steroids-able to move RUE more freely today Discussed with Dr. Amada Jupiter on 9/20-Solu-Medrol 1 g daily x 5 days planned (on day 3/5) Will need outpatient follow-up with Dr. Epimenio Foot to initiate disease modifying treatment. PT/OT eval-Home health recommended.   Hypokalemia Replete/recheck  Steroid-induced hyperglycemia SSI A1c pending   Recent Labs    03/28/23 1803 03/28/23 2112 03/29/23 0750  GLUCAP 232* 182* 126*     HTN BP stable Continue amlodipine/irbesartan/HCTZ Follow-up.  Obesity: Estimated body mass index is 32.77 kg/m as calculated from the following:   Height as of this encounter: 5\' 6"  (1.676 m).   Weight as of this encounter: 92.1 kg.   Code status:   Code Status: Full Code   DVT Prophylaxis: enoxaparin (LOVENOX) injection 40 mg Start: 03/27/23 1400   Family Communication: None at bedside   Disposition Plan: Status is: Inpatient Remains inpatient appropriate because: Severity of illness   Planned Discharge Destination:Home with home health   Diet: Diet Order             Diet regular Room service appropriate? Yes; Fluid consistency: Thin  Diet effective now  Antimicrobial agents: Anti-infectives (From admission, onward)    None        MEDICATIONS: Scheduled Meds:  amLODipine  5 mg Oral QHS   enoxaparin (LOVENOX) injection  40 mg Subcutaneous Q24H   hydrochlorothiazide  12.5 mg Oral Daily   insulin aspart  0-6 Units Subcutaneous TID WC   irbesartan  150 mg Oral Daily   pantoprazole  40 mg Oral Daily   sodium chloride flush  3 mL Intravenous Q12H   Continuous Infusions:  methylPREDNISolone (SOLU-MEDROL) injection 1,000 mg (03/28/23 1125)   PRN Meds:.acetaminophen  **OR** acetaminophen, albuterol, hydrALAZINE, ondansetron **OR** ondansetron (ZOFRAN) IV   I have personally reviewed following labs and imaging studies  LABORATORY DATA: CBC: Recent Labs  Lab 03/26/23 2223 03/28/23 0802  WBC 4.7 11.6*  NEUTROABS 1.8  --   HGB 13.5 15.2*  HCT 40.2 46.1*  MCV 89.7 90.0  PLT 257 282    Basic Metabolic Panel: Recent Labs  Lab 03/26/23 2223 03/28/23 0802 03/29/23 0635  NA 139 137 138  K 3.3* 4.2 3.4*  CL 104 105 102  CO2 25 17* 26  GLUCOSE 105* 136* 142*  BUN 11 11 16   CREATININE 0.85 0.75 0.80  CALCIUM 9.4 9.7 9.7    GFR: Estimated Creatinine Clearance: 94 mL/min (by C-G formula based on SCr of 0.8 mg/dL).  Liver Function Tests: Recent Labs  Lab 03/26/23 2223  AST 30  ALT 30  ALKPHOS 57  BILITOT 0.8  PROT 7.7  ALBUMIN 4.4   No results for input(s): "LIPASE", "AMYLASE" in the last 168 hours. No results for input(s): "AMMONIA" in the last 168 hours.  Coagulation Profile: Recent Labs  Lab 03/27/23 0226  INR 1.0    Cardiac Enzymes: No results for input(s): "CKTOTAL", "CKMB", "CKMBINDEX", "TROPONINI" in the last 168 hours.  BNP (last 3 results) No results for input(s): "PROBNP" in the last 8760 hours.  Lipid Profile: No results for input(s): "CHOL", "HDL", "LDLCALC", "TRIG", "CHOLHDL", "LDLDIRECT" in the last 72 hours.  Thyroid Function Tests: No results for input(s): "TSH", "T4TOTAL", "FREET4", "T3FREE", "THYROIDAB" in the last 72 hours.  Anemia Panel: No results for input(s): "VITAMINB12", "FOLATE", "FERRITIN", "TIBC", "IRON", "RETICCTPCT" in the last 72 hours.  Urine analysis:    Component Value Date/Time   COLORURINE YELLOW 03/27/2023 0226   APPEARANCEUR CLEAR 03/27/2023 0226   LABSPEC 1.025 03/27/2023 0226   PHURINE 7.0 03/27/2023 0226   GLUCOSEU NEGATIVE 03/27/2023 0226   HGBUR TRACE (A) 03/27/2023 0226   BILIRUBINUR NEGATIVE 03/27/2023 0226   KETONESUR NEGATIVE 03/27/2023 0226   PROTEINUR NEGATIVE  03/27/2023 0226   NITRITE NEGATIVE 03/27/2023 0226   LEUKOCYTESUR NEGATIVE 03/27/2023 0226    Sepsis Labs: Lactic Acid, Venous No results found for: "LATICACIDVEN"  MICROBIOLOGY: Recent Results (from the past 240 hour(s))  SARS Coronavirus 2 by RT PCR (hospital order, performed in [facility] hospital lab) *cepheid single result test* Anterior Nasal Swab     Status: None   Collection Time: 03/26/23 11:45 PM   Specimen: Anterior Nasal Swab  Result Value Ref Range Status   SARS Coronavirus 2 by RT PCR NEGATIVE NEGATIVE Final    Comment: (NOTE) SARS-CoV-2 target nucleic acids are NOT DETECTED.  The SARS-CoV-2 RNA is generally detectable in upper and lower respiratory specimens during the acute phase of infection. The lowest concentration of SARS-CoV-2 viral copies this assay can detect is 250 copies / mL. A negative result does not preclude SARS-CoV-2 infection and should not be used as the sole  basis for treatment or other patient management decisions.  A negative result may occur with improper specimen collection / handling, submission of specimen other than nasopharyngeal swab, presence of viral mutation(s) within the areas targeted by this assay, and inadequate number of viral copies (<250 copies / mL). A negative result must be combined with clinical observations, patient history, and epidemiological information.  Fact Sheet for Patients:   RoadLapTop.co.za  Fact Sheet for Healthcare Providers: http://kim-miller.com/  This test is not yet approved or  cleared by the [place] FDA and has been authorized for detection and/or diagnosis of SARS-CoV-2 by FDA under an Emergency Use Authorization (EUA).  This EUA will remain in effect (meaning this test can be used) for the duration of the COVID-19 declaration under Section 564(b)(1) of the Act, 21 U.S.C. section 360bbb-3(b)(1), unless the authorization is terminated or revoked  sooner.  Performed at [facility], [address] [place], [place] [address]     RADIOLOGY STUDIES/RESULTS: No results found.   LOS: 2 days   Jeoffrey Massed, MD  [facility]    To contact the attending provider between 7A-7P or the covering provider during after hours 7P-7A, please log into the web site [email or web address] and access using universal [place] password for that web site. If you do not have the password, please call the hospital operator.  03/29/2023, 10:54 AM

## 2023-03-29 NOTE — Progress Notes (Signed)
Neurology Progress Note   S:// Patient sitting in the bed on the phone. She is crying due to the loss of her dog.  She states she she feels about the same, still with weakness in her right arm, numbness on left hand and imbalance while walking. She endorses having some LLE numbness Tolerating the steroids, sleeping and eating ok. Today is day # 3. Patient is wanting to stay for the next 2 doses    O:// Current vital signs: BP 123/77 (BP Location: Left Arm)   Pulse 90   Temp 98.2 F (36.8 C) (Oral)   Resp 19   Ht 5\' 6"  (1.676 m)   Wt 92.1 kg   SpO2 97%   BMI 32.77 kg/m  Vital signs in last 24 hours: Temp:  [98.2 F (36.8 C)-99.7 F (37.6 C)] 98.2 F (36.8 C) (09/21 1610) Pulse Rate:  [90-111] 90 (09/21 0812) Resp:  [16-20] 19 (09/21 0812) BP: (112-128)/(75-91) 123/77 (09/21 0812) SpO2:  [91 %-99 %] 97 % (09/21 0812)  GENERAL: Awake, alert in upset and crying HEENT: - Normocephalic and atraumatic, dry mm LUNGS - Clear to auscultation bilaterally with no wheezes CV - S1S2 RRR, no m/r/g, equal pulses bilaterally. ABDOMEN - Soft, nontender, nondistended with normoactive BS Ext: warm, well perfused, intact peripheral pulses, no edema  NEURO:  Mental Status: AA&Ox4 Language: speech is clear.  Naming, repetition, fluency, and comprehension intact. Cranial Nerves: PERRL EOMI, visual fields full, no facial asymmetry, facial sensation intact, hearing intact, tongue/uvula/soft palate midline, normal sternocleidomastoid and trapezius muscle strength. No evidence of tongue atrophy or fibrillations Motor: left arm and leg 5/5, right arm 4/5 with slight drift and right lower 4/5  Tone: is normal and bulk is normal Sensation- numbness on left hand and left leg  Coordination: FTN intact bilaterally, no ataxia in BLE. Gait- deferred   Medications  Current Facility-Administered Medications:    acetaminophen (TYLENOL) tablet 650 mg, 650 mg, Oral, Q6H PRN, 650 mg at 03/28/23 1236 **OR**  acetaminophen (TYLENOL) suppository 650 mg, 650 mg, Rectal, Q6H PRN, Katrinka Blazing, Rondell A, MD   albuterol (PROVENTIL) (2.5 MG/3ML) 0.083% nebulizer solution 2.5 mg, 2.5 mg, Nebulization, Q6H PRN, Katrinka Blazing, Rondell A, MD   amLODipine (NORVASC) tablet 5 mg, 5 mg, Oral, QHS, Smith, Rondell A, MD, 5 mg at 03/28/23 2127   enoxaparin (LOVENOX) injection 40 mg, 40 mg, Subcutaneous, Q24H, Smith, Rondell A, MD, 40 mg at 03/28/23 1758   hydrALAZINE (APRESOLINE) injection 10 mg, 10 mg, Intravenous, Q4H PRN, Katrinka Blazing, Rondell A, MD   hydrochlorothiazide (HYDRODIURIL) tablet 12.5 mg, 12.5 mg, Oral, Daily, Smith, Rondell A, MD, 12.5 mg at 03/29/23 0954   insulin aspart (novoLOG) injection 0-6 Units, 0-6 Units, Subcutaneous, TID WC, Ghimire, Werner Lean, MD, 2 Units at 03/28/23 1812   irbesartan (AVAPRO) tablet 150 mg, 150 mg, Oral, Daily, Katrinka Blazing, Rondell A, MD, 150 mg at 03/29/23 0954   methylPREDNISolone sodium succinate (SOLU-MEDROL) 1,000 mg in sodium chloride 0.9 % 50 mL IVPB, 1,000 mg, Intravenous, Daily, Ghimire, Werner Lean, MD, Last Rate: 66 mL/hr at 03/28/23 1125, 1,000 mg at 03/28/23 1125   ondansetron (ZOFRAN) tablet 4 mg, 4 mg, Oral, Q6H PRN **OR** ondansetron (ZOFRAN) injection 4 mg, 4 mg, Intravenous, Q6H PRN, Smith, Rondell A, MD   pantoprazole (PROTONIX) EC tablet 40 mg, 40 mg, Oral, Daily, Smith, Rondell A, MD, 40 mg at 03/29/23 0954   potassium chloride SA (KLOR-CON M) CR tablet 40 mEq, 40 mEq, Oral, Once, Ghimire, Werner Lean, MD  sodium chloride flush (NS) 0.9 % injection 3 mL, 3 mL, Intravenous, Q12H, Smith, Rondell A, MD, 3 mL at 03/29/23 0956  Labs CBC    Component Value Date/Time   WBC 11.6 (H) 03/28/2023 0802   RBC 5.12 (H) 03/28/2023 0802   HGB 15.2 (H) 03/28/2023 0802   HCT 46.1 (H) 03/28/2023 0802   PLT 282 03/28/2023 0802   MCV 90.0 03/28/2023 0802   MCH 29.7 03/28/2023 0802   MCHC 33.0 03/28/2023 0802   RDW 13.1 03/28/2023 0802   LYMPHSABS 2.5 03/26/2023 2223   MONOABS 0.3 03/26/2023 2223    EOSABS 0.1 03/26/2023 2223   BASOSABS 0.0 03/26/2023 2223    CMP     Component Value Date/Time   NA 138 03/29/2023 0635   K 3.4 (L) 03/29/2023 0635   CL 102 03/29/2023 0635   CO2 26 03/29/2023 0635   GLUCOSE 142 (H) 03/29/2023 0635   BUN 16 03/29/2023 0635   CREATININE 0.80 03/29/2023 0635   CALCIUM 9.7 03/29/2023 0635   PROT 7.7 03/26/2023 2223   ALBUMIN 4.4 03/26/2023 2223   AST 30 03/26/2023 2223   ALT 30 03/26/2023 2223   ALKPHOS 57 03/26/2023 2223   BILITOT 0.8 03/26/2023 2223   GFRNONAA >60 03/29/2023 0635    Lipid Panel     Component Value Date/Time   CHOL 219 (H) 01/31/2022 0822   TRIG 100.0 01/31/2022 0822   HDL 70.50 01/31/2022 0822   CHOLHDL 3 01/31/2022 0822   VLDL 20.0 01/31/2022 0822   LDLCALC 129 (H) 01/31/2022 0822    Lab Results  Component Value Date   HGBA1C 6.0 01/31/2022     Imaging I have reviewed images in epic and the results pertinent to this consultation are:  CT-scan of the brain- no acute process  MRI examination of the brain w/wo -  Multiple T2 hyperintense lesions in the juxtacortical and periventricular white matter, suggestive of demyelinating disease.  MRI c spine w/wo - faint blush of enhancement in the right hemicord at the C3 level suspicious for acute demyelination  Assessment:  52 yo with decreased sensation in her left hand, left upper extremity weakness-along with incoordination of her right upper/right lower extremities and with her MRI findings are most consistent with multiple sclerosis   Recommendations: - continue IV Solumedrol 1 g daily for 5 days  - PPI while on steroids  - monitor BG while on steroids  - PT/OT  - Will need outpatient follow up with Neurology for DMT - Neurology will continue to follow   Gevena Mart DNP, ACNPC-AG  [facility]

## 2023-03-29 NOTE — Progress Notes (Signed)
Mobility Specialist Progress Note:   03/29/23 1000  Mobility  Activity Ambulated with assistance in hallway  Level of Assistance Contact guard assist, steadying assist  Assistive Device None  Distance Ambulated (ft) 400 ft  Activity Response Tolerated well  Mobility Referral Yes  $Mobility charge 1 Mobility  Mobility Specialist Start Time (ACUTE ONLY) 0913  Mobility Specialist Stop Time (ACUTE ONLY) 0925  Mobility Specialist Time Calculation (min) (ACUTE ONLY) 12 min    Pre Mobility: 98 HR, 93% SpO2 During Mobility: 113 HR,  94% SpO2 Post Mobility:  108 HR,  96% SpO2  Pt received in bed, agreeable to mobility. Asymptomatic throughout w/ no complaints. Pt left on EOB with call bell and all needs met.  [place]ante Earlene Plater Mobility Specialist Please contact via Special educational needs teacher or Rehab office at [phone]

## 2023-03-30 DIAGNOSIS — G35 Multiple sclerosis: Secondary | ICD-10-CM | POA: Diagnosis not present

## 2023-03-30 LAB — GLUCOSE, CAPILLARY
Glucose-Capillary: 142 mg/dL — ABNORMAL HIGH (ref 70–99)
Glucose-Capillary: 199 mg/dL — ABNORMAL HIGH (ref 70–99)
Glucose-Capillary: 206 mg/dL — ABNORMAL HIGH (ref 70–99)
Glucose-Capillary: 190 mg/dL — ABNORMAL HIGH (ref 70–99)

## 2023-03-30 LAB — NEUROMYELITIS OPTICA AUTOAB, IGG: NMO-IgG: <1.5 U/mL (ref 0.0–3.0)

## 2023-03-30 LAB — BASIC METABOLIC PANEL
Anion gap: 9 (ref 5–15)
BUN: 16 mg/dL (ref 6–20)
CO2: 26 mmol/L (ref 22–32)
Calcium: 9.5 mg/dL (ref 8.9–10.3)
Chloride: 103 mmol/L (ref 98–111)
Creatinine, Ser: 0.79 mg/dL (ref 0.44–1.00)
GFR, Estimated: >60 mL/min (ref >60)
Glucose, Bld: 130 mg/dL — ABNORMAL HIGH (ref 70–99)
Potassium: 3.6 mmol/L (ref 3.5–5.1)
Sodium: 138 mmol/L (ref 135–145)

## 2023-03-30 LAB — MAGNESIUM: Magnesium: 2.3 mg/dL (ref 1.7–2.4)

## 2023-03-30 NOTE — Progress Notes (Signed)
PROGRESS NOTE        PATIENT DETAILS Name: Jamie Watson Age: 52 y.o. Sex: female Date of Birth: 1970-07-26 Admit Date: 03/27/2023 Admitting Physician Clydie Braun, MD UJW:[id number], Bertram Millard, MD  Brief Summary: Patient is a 52 y.o.  female with history of HTN, chronic headaches-presented with decreased sensation in her left hand, left upper extremity weakness-along with incoordination of her right upper/right lower extremities-she was evaluated by neurology-underwent neuroimaging with MRI brain-and was thought to have new onset multiple sclerosis with flare.  She was started on IV steroids and subsequently admitted to the hospitalist service  Significant events: 9/19>>  Admit to [facility]  Significant studies: 9/20>> MRI brain/C-spine with/without contrast: Enhancement of right hemicord at C3 level suspicious for acute demyelination.  Multiple hyperintense T2 lesions in the juxtacortical and periventricular white matter suggestive of demyelinating disease without enhancement.  Significant microbiology data: 9/18>> COVID PCR: Negative  Procedures: None  Consults: Neurology  Subjective:  Patient in bed, appears comfortable, denies any headache, no fever, no chest pain or pressure, no shortness of breath , no abdominal pain.  Weakness improving, right arm still slightly weak as compared to other extremities..   Objective: Vitals: Blood pressure (!) 135/97, pulse 100, temperature 98.2 F (36.8 C), temperature source Oral, resp. rate 20, height 5\' 6"  (1.676 m), weight 92.1 kg, SpO2 95%.   Exam:  Awake Alert, No new F.N deficits, mild right-sided weakness arm weaker than leg, still 5/5 in all 4 extremities [place].AT,PERRAL Supple Neck, No JVD,   Symmetrical Chest wall movement, Good air movement bilaterally, CTAB RRR,No Gallops, Rubs or new Murmurs,  +ve B.Sounds, Abd Soft, No tenderness,   No Cyanosis, Clubbing or edema    Assessment/Plan:  Newly diagnosed  MS with flare Some improvement after IV steroids-able to move RUE more freely today Discussed with Dr. Amada Jupiter on 9/20-Solu-Medrol 1 g daily x 5 days planned (on day 4/5) Will need outpatient follow-up with Dr. Epimenio Foot to initiate disease modifying treatment. PT/OT eval-Home health recommended.   Hypokalemia Replete/recheck  Steroid-induced hyperglycemia SSI A1c pending   Recent Labs    03/29/23 1550 03/29/23 2149 03/30/23 0816  GLUCAP 183* 188* 142*     HTN BP stable Continue amlodipine/irbesartan/HCTZ Follow-up.  Obesity: Estimated body mass index is 32.77 kg/m as calculated from the following:   Height as of this encounter: 5\' 6"  (1.676 m).   Weight as of this encounter: 92.1 kg.   Code status:   Code Status: Full Code   DVT Prophylaxis: enoxaparin (LOVENOX) injection 40 mg Start: 03/27/23 1400   Family Communication: None at bedside   Disposition Plan: Status is: Inpatient Remains inpatient appropriate because: Severity of illness   Planned Discharge Destination:Home with home health   Diet: Diet Order             Diet regular Room service appropriate? Yes; Fluid consistency: Thin  Diet effective now                     Antimicrobial agents: Anti-infectives (From admission, onward)    None        MEDICATIONS: Scheduled Meds:  amLODipine  5 mg Oral QHS   enoxaparin (LOVENOX) injection  40 mg Subcutaneous Q24H   hydrochlorothiazide  12.5 mg Oral Daily   insulin aspart  0-6 Units Subcutaneous TID WC   irbesartan  150 mg Oral Daily   pantoprazole  40 mg Oral Daily   sodium chloride flush  3 mL Intravenous Q12H   Continuous Infusions:  methylPREDNISolone (SOLU-MEDROL) injection 1,000 mg (03/29/23 1101)   PRN Meds:.acetaminophen **OR** acetaminophen, albuterol, hydrALAZINE, ondansetron **OR** ondansetron (ZOFRAN) IV   I have personally reviewed following labs and imaging studies  LABORATORY DATA:  Recent Labs  Lab  03/26/23 2223 03/28/23 0802  WBC 4.7 11.6*  HGB 13.5 15.2*  HCT 40.2 46.1*  PLT 257 282  MCV 89.7 90.0  MCH 30.1 29.7  MCHC 33.6 33.0  RDW 12.8 13.1  LYMPHSABS 2.5  --   MONOABS 0.3  --   EOSABS 0.1  --   BASOSABS 0.0  --     Recent Labs  Lab 03/26/23 2223 03/27/23 0226 03/28/23 0802 03/29/23 0635  NA 139  --  137 138  K 3.3*  --  4.2 3.4*  CL 104  --  105 102  CO2 25  --  17* 26  ANIONGAP 10  --  15 10  GLUCOSE 105*  --  136* 142*  BUN 11  --  11 16  CREATININE 0.85  --  0.75 0.80  AST 30  --   --   --   ALT 30  --   --   --   ALKPHOS 57  --   --   --   BILITOT 0.8  --   --   --   ALBUMIN 4.4  --   --   --   INR  --  1.0  --   --   HGBA1C  --   --   --  5.8*  CALCIUM 9.4  --  9.7 9.7     RADIOLOGY STUDIES/RESULTS: No results found.   LOS: 3 days   Signature  -    Susa Raring M.D on 03/30/2023 at 8:31 AM   -  To page go to [email or web address]

## 2023-03-31 ENCOUNTER — Other Ambulatory Visit (HOSPITAL_COMMUNITY): Payer: Self-pay

## 2023-03-31 DIAGNOSIS — G35 Multiple sclerosis: Secondary | ICD-10-CM | POA: Diagnosis not present

## 2023-03-31 LAB — GLUCOSE, CAPILLARY: Glucose-Capillary: 139 mg/dL — ABNORMAL HIGH (ref 70–99)

## 2023-03-31 MED ORDER — PANTOPRAZOLE SODIUM 40 MG PO TBEC
40.0000 mg | DELAYED_RELEASE_TABLET | Freq: Every day | ORAL | 0 refills | Status: DC
  Filled 2023-03-31: qty 15, 15d supply, fill #0

## 2023-03-31 NOTE — Plan of Care (Signed)
                                      [facility]                            [address] [place], [place] [address]      Jamie Watson was admitted to the Hospital on 03/27/2023 and Discharged  03/31/2023 and should be excused from work/school   for 10  days starting from date -  03/27/2023 , may return to work/school without any restrictions.  Call Susa Raring MD, [facility]  [phone] with questions.  Susa Raring M.D on 03/31/2023,at 8:01 AM  [facility]   Office  [phone]

## 2023-03-31 NOTE — Progress Notes (Signed)
Occupational Therapy Treatment Patient Details Name: Jamie Watson MRN: [id number] DOB: 18-Jun-1971 Today's Date: 03/31/2023   History of present illness 52yo female who presented to the ED on 9/18 due to increased malaise, dizziness, B hand weakness, R UE weakness, gait deviations due to R LE weakness/numbness, and vision changes in L eye >R eye. MRI with contrast shows multiple white matter lesions suggestive of possible demyelination. She endorses similar sx have come intermittently over the past several years.Admitted for further w/u. PMH fatigue, HTN, irriegular HR   OT comments  Pt is making good progress towards their acute OT goals. Session focused on energy conservation education and pt verbalized great understanding. She demonstrated mod I ability to use built up handles and foam on eating utensils, grooming supplies and writing utensils to use her dominant R hand for ADL/IADL tasks. Pt expressed concern of her R knee hyper-extending but has great awareness during mobility and ADL - recommend RW use initially for safety. OT to continue to follow acutely to facilitate progress towards established goals. Pt will continue to benefit from [facility].       If plan is discharge home, recommend the following:  A little help with bathing/dressing/bathroom;Assistance with cooking/housework;Assist for transportation;Help with stairs or ramp for entrance   Equipment Recommendations  Other (comment)       Precautions / Restrictions Precautions Precautions: Fall Precaution Comments: R side weakness Restrictions Weight Bearing Restrictions: No       Mobility Bed Mobility                    Transfers Overall transfer level: Needs assistance Equipment used: None Transfers: Sit to/from Stand Sit to Stand: Supervision           General transfer comment: for sfaety only         ADL either performed or assessed with clinical judgement   ADL Overall ADL's : Needs  assistance/impaired Eating/Feeding: Modified independent Eating/Feeding Details (indicate cue type and reason): increased time and effort for packages, needs built up handles for utencils                     Toilet Transfer: Supervision/safety;Ambulation;Rolling walker (2 wheels)           Functional mobility during ADLs: Supervision/safety General ADL Comments: no overt buckle or LOB this date; pt moves slowly adn guarded    Extremity/Trunk Assessment Upper Extremity Assessment Upper Extremity Assessment: RUE deficits/detail RUE Deficits / Details: weaker than LUE, able to form grasp but reports difficulty holding on to utensils, toothbrush, etc. 3+/5 overall for biceps, triceps and shoulders. denies sensation changes. RUE Coordination: decreased fine motor   Lower Extremity Assessment Lower Extremity Assessment: Defer to PT evaluation        Vision   Vision Assessment?: No apparent visual deficits Additional Comments: Overall WFL, denies acute changes   Perception Perception Perception: Within Functional Limits   Praxis Praxis Praxis: WFL    Cognition Arousal: Alert Behavior During Therapy: WFL for tasks assessed/performed Overall Cognitive Status: Within Functional Limits for tasks assessed                                 General Comments: Eager to d/c home; educated about energy conservation strategies              General Comments VSS on RA    Pertinent Vitals/ Pain  Pain Assessment Pain Assessment: No/denies pain   Frequency  Min 1X/week        Progress Toward Goals  OT Goals(current goals can now be found in the care plan section)  Progress towards OT goals: Progressing toward goals  Acute Rehab OT Goals Patient Stated Goal: to go home today OT Goal Formulation: With patient Time For Goal Achievement: 04/18/23 Potential to Achieve Goals: Good ADL Goals Pt Will Perform Lower Body Dressing: with modified  independence;sit to/from stand Pt Will Transfer to Toilet: with modified independence;ambulating Pt/caregiver will Perform Home Exercise Program: Increased strength;Right Upper extremity;With theraband;With theraputty;Independently;With written HEP provided   AM-PAC OT "6 Clicks" Daily Activity     Outcome Measure   Help from another person eating meals?: A Little Help from another person taking care of personal grooming?: A Little Help from another person toileting, which includes using toliet, bedpan, or urinal?: A Little Help from another person bathing (including washing, rinsing, drying)?: A Little Help from another person to put on and taking off regular upper body clothing?: A Little Help from another person to put on and taking off regular lower body clothing?: A Little 6 Click Score: 18    End of Session Equipment Utilized During Treatment: Rolling walker (2 wheels)  OT Visit Diagnosis: Unsteadiness on feet (R26.81);Other abnormalities of gait and mobility (R26.89);Muscle weakness (generalized) (M62.81)   Activity Tolerance Patient tolerated treatment well   Patient Left in chair;with call bell/phone within reach;with chair alarm set;with family/visitor present   Nurse Communication          Time:  -     Charges: OT General Charges $OT Visit: 1 Visit OT Treatments $Self Care/Home Management : 8-22 mins  Derenda Mis, OTR/L Acute Rehabilitation Services Office [phone] Secure Chat Communication Preferred   Donia Pounds 03/31/2023, 9:12 AM

## 2023-03-31 NOTE — Discharge Summary (Signed)
PACE PUMA NWG:[id number] DOB: 05/26/1971 DOA: 03/27/2023  PCP: Loyola Mast, MD  Admit date: 03/27/2023  Discharge date: 03/31/2023  Admitted From: Home   Disposition:  Home   Recommendations for Outpatient Follow-up:   Follow up with PCP in 1-2 weeks  PCP Please obtain BMP/CBC, 2 view CXR in 1week,  (see Discharge instructions)   PCP Please follow up on the following pending results:    Home Health: PT, OT   Equipment/Devices: None  Consultations: Neuro Discharge Condition: Stable    CODE STATUS: Full    Diet Recommendation: Heart Healthy     Chief Complaint  Patient presents with   Dizziness     Brief history of present illness from the day of admission and additional interim summary    52 y.o.  female with history of HTN, chronic headaches-presented with decreased sensation in her left hand, left upper extremity weakness-along with incoordination of her right upper/right lower extremities-she was evaluated by neurology-underwent neuroimaging with MRI brain-and was thought to have new onset multiple sclerosis with flare.  She was started on IV steroids and subsequently admitted to the hospitalist service   Significant events: 9/19>>  Admit to [facility]   Significant studies: 9/20>> MRI brain/C-spine with/without contrast: Enhancement of right hemicord at C3 level suspicious for acute demyelination.  Multiple hyperintense T2 lesions in the juxtacortical and periventricular white matter suggestive of demyelinating disease without enhancement.                                                                 Hospital Course    Newly diagnosed MS with flare - In by neurology, MRI noted, received 5 doses of IV Solu-Medrol at high dose, finishing fifth dose today, symptoms improved, she mostly had involvement  of the right side mostly right upper extremity, still minimally weak but much improved as compared to at the time of admission, will be discharged with outpatient follow-up with PCP and neurology.  Request PCP to arrange for outpatient neurology visit for MS follow-up.  Home PT OT also ordered if she qualifies.      Hypokalemia Replaced and stable   Steroid-induced hyperglycemia stable A1c, covered with ISS while she was on steroid.  HTN  Stable on home regimen continue   Obesity:  Estimated body mass index is 32.77, follow-up with PCP  Discharge diagnosis     Principal Problem:   MS (multiple sclerosis) (HCC) Active Problems:   Essential hypertension   Hypokalemia   Obesity (BMI 30-39.9)   Weakness    Discharge instructions    Discharge Instructions     Diet - low sodium heart healthy   Complete by: As directed    Discharge instructions   Complete by: As directed    Follow with Primary MD Loyola Mast,  MD in 7 days   Get CBC, CMP, magnesium, vitamin D, TSH, B12-  checked next visit with your primary MD    Activity: As tolerated with Full fall precautions use walker/cane & assistance as needed  Disposition Home    Diet: Heart Healthy   Special Instructions: If you have smoked or chewed Tobacco  in the last 2 yrs please stop smoking, stop any regular Alcohol  and or any Recreational drug use.  On your next visit with your primary care physician please Get Medicines reviewed and adjusted.  Please request your Prim.MD to go over all Hospital Tests and Procedure/Radiological results at the follow up, please get all Hospital records sent to your Prim MD by signing hospital release before you go home.  If you experience worsening of your admission symptoms, develop shortness of breath, life threatening emergency, suicidal or homicidal thoughts you must seek medical attention immediately by calling 911 or calling your MD immediately  if symptoms less severe.  You Must  read complete instructions/literature along with all the possible adverse reactions/side effects for all the Medicines you take and that have been prescribed to you. Take any new Medicines after you have completely understood and accpet all the possible adverse reactions/side effects.   Do not drive when taking Pain medications.  Do not take more than prescribed Pain, Sleep and Anxiety Medications   Increase activity slowly   Complete by: As directed        Discharge Medications   Allergies as of 03/31/2023   No Known Allergies      Medication List     TAKE these medications    amLODipine 5 MG tablet Commonly known as: NORVASC Take 1 tablet (5 mg total) by mouth at bedtime.   Omron 3 Series BP Monitor Devi USE AS DIRECTED   pantoprazole 40 MG tablet Commonly known as: PROTONIX Take 1 tablet (40 mg total) by mouth daily.   valsartan-hydrochlorothiazide 160-12.5 MG tablet Commonly known as: DIOVAN-HCT Take 1 tablet by mouth daily.         Follow-up Information     Loyola Mast, MD. Schedule an appointment as soon as possible for a visit in 1 week(s).   Specialty: Family Medicine Why: Please get a referral to neurology for MS Contact information: [address] [place] [address] [phone]         [facility]. Schedule an appointment as soon as possible for a visit in 1 week(s).   Why: Multiple sclerosis Contact information: [address]     [address] [place] [address] [phone]                Major procedures and Radiology Reports - PLEASE review detailed and final reports thoroughly  -      MR Cervical Spine W and Wo Contrast  Addendum Date: 03/27/2023   ADDENDUM REPORT: 03/27/2023 10:09 ADDENDUM: On further review, there is a faint blush of enhancement in the right hemicord at the C3 level (sagittal image 7 series 14, axial image 12 series 15), suspicious for acute demyelination.  INDICATION: Dizziness.  Headaches.  Right leg weakness. Electronically Signed   By: Orvan Falconer M.D.   On: 03/27/2023 10:09   Result Date: 03/27/2023 EXAM: MRI HEAD WITH CONTRAST MRI CERVICAL SPINE WITHOUT AND WITH CONTRAST TECHNIQUE: Multiplanar, multiecho pulse sequences of the brain and surrounding structures were obtained with intravenous contrast. Multiplanar, multi sequence imaging of the cervical spine, to include the craniocervical  junction and cervicothoracic junction, were obtained without and with intravenous contrast. CONTRAST:  9mL GADAVIST GADOBUTROL 1 MMOL/ML IV SOLN COMPARISON:  MRI brain 03/27/2023. FINDINGS: MRI HEAD FINDINGS Brain: Multiple T2 hyperintense lesions in the juxtacortical and periventricular white matter, suggestive of demyelinating disease. No enhancement to suggest acute demyelination. Vascular: Normal flow voids and vessel enhancement. Skull and upper cervical spine: Normal marrow signal and enhancement. Sinuses/Orbits: No acute findings. Other: None. MRI CERVICAL SPINE FINDINGS Alignment: Normal. Vertebrae: No fracture, evidence of discitis, or bone lesion. Cord: Moderately motion degraded study. Within this limitation, probable demyelinating lesions in the dorsal cord at the C3-4 and T2 levels (sagittal image 9 series 5/9). No enhancement to suggest acute demyelination. Posterior Fossa, vertebral arteries, paraspinal tissues: Unremarkable. Disc levels: C2-C3:  Normal. C3-C4:  Normal. C4-C5: Right-greater-than-left facet arthropathy and uncovertebral joint spurring results in moderate right neural foraminal narrowing. C5-C6: Disc bulge results in mild spinal canal stenosis. Facet arthropathy and uncovertebral joint spurring results in severe bilateral neural foraminal narrowing. C6-C7:  Normal. C7-T1:  Normal. IMPRESSION: 1. Multiple T2 hyperintense lesions in the juxtacortical and periventricular white matter, suggestive of demyelinating disease. No enhancement to suggest  acute demyelination. 2. Moderately motion degraded cervical spine MRI. Within this limitation, probable demyelinating lesions in the dorsal cord at the C3-4 and T2 levels. No enhancement to suggest acute demyelination. 3. Cervical spondylosis, worst at C5-C6, where there is mild spinal canal stenosis and severe bilateral neural foraminal narrowing. Electronically Signed: By: Orvan Falconer M.D. On: 03/27/2023 09:22   MR BRAIN W CONTRAST  Addendum Date: 03/27/2023   ADDENDUM REPORT: 03/27/2023 10:09 ADDENDUM: On further review, there is a faint blush of enhancement in the right hemicord at the C3 level (sagittal image 7 series 14, axial image 12 series 15), suspicious for acute demyelination. INDICATION: Dizziness.  Headaches.  Right leg weakness. Electronically Signed   By: Orvan Falconer M.D.   On: 03/27/2023 10:09   Result Date: 03/27/2023 EXAM: MRI HEAD WITH CONTRAST MRI CERVICAL SPINE WITHOUT AND WITH CONTRAST TECHNIQUE: Multiplanar, multiecho pulse sequences of the brain and surrounding structures were obtained with intravenous contrast. Multiplanar, multi sequence imaging of the cervical spine, to include the craniocervical junction and cervicothoracic junction, were obtained without and with intravenous contrast. CONTRAST:  9mL GADAVIST GADOBUTROL 1 MMOL/ML IV SOLN COMPARISON:  MRI brain 03/27/2023. FINDINGS: MRI HEAD FINDINGS Brain: Multiple T2 hyperintense lesions in the juxtacortical and periventricular white matter, suggestive of demyelinating disease. No enhancement to suggest acute demyelination. Vascular: Normal flow voids and vessel enhancement. Skull and upper cervical spine: Normal marrow signal and enhancement. Sinuses/Orbits: No acute findings. Other: None. MRI CERVICAL SPINE FINDINGS Alignment: Normal. Vertebrae: No fracture, evidence of discitis, or bone lesion. Cord: Moderately motion degraded study. Within this limitation, probable demyelinating lesions in the dorsal cord at the C3-4  and T2 levels (sagittal image 9 series 5/9). No enhancement to suggest acute demyelination. Posterior Fossa, vertebral arteries, paraspinal tissues: Unremarkable. Disc levels: C2-C3:  Normal. C3-C4:  Normal. C4-C5: Right-greater-than-left facet arthropathy and uncovertebral joint spurring results in moderate right neural foraminal narrowing. C5-C6: Disc bulge results in mild spinal canal stenosis. Facet arthropathy and uncovertebral joint spurring results in severe bilateral neural foraminal narrowing. C6-C7:  Normal. C7-T1:  Normal. IMPRESSION: 1. Multiple T2 hyperintense lesions in the juxtacortical and periventricular white matter, suggestive of demyelinating disease. No enhancement to suggest acute demyelination. 2. Moderately motion degraded cervical spine MRI. Within this limitation, probable demyelinating lesions in the dorsal cord at the C3-4  and T2 levels. No enhancement to suggest acute demyelination. 3. Cervical spondylosis, worst at C5-C6, where there is mild spinal canal stenosis and severe bilateral neural foraminal narrowing. Electronically Signed: By: Orvan Falconer M.D. On: 03/27/2023 09:22   MR BRAIN WO CONTRAST  Result Date: 03/27/2023 CLINICAL DATA:  52 year old female with dizziness, bilateral hand numbness, neurologic deficit. Right leg symptoms for 3 days. EXAM: MRI HEAD WITHOUT CONTRAST TECHNIQUE: Multiplanar, multiecho pulse sequences of the brain and surrounding structures were obtained without intravenous contrast. COMPARISON:  Head CT last night. FINDINGS: Brain: Normal cerebral volume. No restricted diffusion to suggest acute infarction. No midline shift, mass effect, evidence of mass lesion, ventriculomegaly, extra-axial collection or acute intracranial hemorrhage. Cervicomedullary junction and pituitary are within normal limits. But there are bilateral scattered, nodular foci of T2 and FLAIR hyperintensity with facilitated diffusion in the periventricular white matter (series 7,  image 35) and some of these appear oriented perpendicular to the ventricles. There is additional involvement of the left corona radiata and external capsule on that side. Conspicuous bilateral temporal lobe periventricular involvement (on the right series 7, image 21). Deep gray nuclei, brainstem, and cerebellum relatively spared. None of the foci are restricted. No cortical involvement or encephalomalacia identified. No chronic cerebral blood products on SWI. Vascular: Major intracranial vascular flow voids are preserved. Skull and upper cervical spine: Negative visible cervical spine. Normal bone marrow signal. Sinuses/Orbits: Orbit motion artifact, grossly normal. Paranasal sinuses and mastoids are stable and well aerated. Other: Grossly negative visible internal auditory structures. Negative visible scalp and face. IMPRESSION: Bilateral, nodular T2/FLAIR hyperintense cerebral white matter lesions in a configuration strongly suggesting Multiple Sclerosis. No evidence of active demyelination on this noncontrast exam. Electronically Signed   By: Odessa Fleming M.D.   On: 03/27/2023 04:19   CT HEAD WO CONTRAST ( )  Result Date: 03/26/2023 CLINICAL DATA:  Dizziness and bilateral hand numbness EXAM: CT HEAD WITHOUT CONTRAST TECHNIQUE: Contiguous axial images were obtained from the base of the skull through the vertex without intravenous contrast. RADIATION DOSE REDUCTION: This exam was performed according to the departmental dose-optimization program which includes automated exposure control, adjustment of the mA and/or kV according to patient size and/or use of iterative reconstruction technique. COMPARISON:  None Available. FINDINGS: Brain: No evidence of acute infarction, hemorrhage, mass, mass effect, or midline shift. No hydrocephalus or extra-axial fluid collection. Partial empty sella. Normal craniocervical junction. Vascular: No hyperdense vessel. Skull: Negative for fracture or focal lesion. Sinuses/Orbits:  The imaged paranasal sinuses are clear. Small osteoma in the left ethmoid air cells. No acute finding in the orbits. Other: The mastoid air cells are well aerated. IMPRESSION: No acute intracranial process. Electronically Signed   By: Wiliam Ke M.D.   On: 03/26/2023 23:29    Micro Results    Recent Results (from the past 240 hour(s))  SARS Coronavirus 2 by RT PCR (hospital order, performed in [facility] hospital lab) *cepheid single result test* Anterior Nasal Swab     Status: None   Collection Time: 03/26/23 11:45 PM   Specimen: Anterior Nasal Swab  Result Value Ref Range Status   SARS Coronavirus 2 by RT PCR NEGATIVE NEGATIVE Final    Comment: (NOTE) SARS-CoV-2 target nucleic acids are NOT DETECTED.  The SARS-CoV-2 RNA is generally detectable in upper and lower respiratory specimens during the acute phase of infection. The lowest concentration of SARS-CoV-2 viral copies this assay can detect is 250 copies / mL. A negative result does not preclude SARS-CoV-2 infection  and should not be used as the sole basis for treatment or other patient management decisions.  A negative result may occur with improper specimen collection / handling, submission of specimen other than nasopharyngeal swab, presence of viral mutation(s) within the areas targeted by this assay, and inadequate number of viral copies (<250 copies / mL). A negative result must be combined with clinical observations, patient history, and epidemiological information.  Fact Sheet for Patients:   RoadLapTop.co.za  Fact Sheet for Healthcare Providers: http://kim-miller.com/  This test is not yet approved or  cleared by the [place] FDA and has been authorized for detection and/or diagnosis of SARS-CoV-2 by FDA under an Emergency Use Authorization (EUA).  This EUA will remain in effect (meaning this test can be used) for the duration of the COVID-19 declaration under  Section 564(b)(1) of the Act, 21 U.S.C. section 360bbb-3(b)(1), unless the authorization is terminated or revoked sooner.  Performed at [facility], [address] [place], [place] [address]     Today   Subjective    Jamie Watson today has no headache,no chest abdominal pain,no new weakness tingling or numbness, feels much better wants to go home today.    Objective   Blood pressure (!) 127/94, pulse 99, temperature 98.1 F (36.7 C), temperature source Oral, resp. rate 20, height 5\' 6"  (1.676 m), weight 92.1 kg, SpO2 95%.   Intake/Output Summary (Last 24 hours) at 03/31/2023 0804 Last data filed at 03/30/2023 0805 Gross per 24 hour  Intake 120 ml  Output --  Net 120 ml    Exam  Awake Alert, No new F.N deficits, minimal right upper extremity weakness.    [place].AT,PERRAL Supple Neck,   Symmetrical Chest wall movement, Good air movement bilaterally, CTAB RRR,No Gallops,   +ve B.Sounds, Abd Soft, Non tender,  No Cyanosis, Clubbing or edema    Data Review   Recent Labs  Lab 03/26/23 2223 03/28/23 0802  WBC 4.7 11.6*  HGB 13.5 15.2*  HCT 40.2 46.1*  PLT 257 282  MCV 89.7 90.0  MCH 30.1 29.7  MCHC 33.6 33.0  RDW 12.8 13.1  LYMPHSABS 2.5  --   MONOABS 0.3  --   EOSABS 0.1  --   BASOSABS 0.0  --     Recent Labs  Lab 03/26/23 2223 03/27/23 0226 03/28/23 0802 03/29/23 0635 03/30/23 0857  NA 139  --  137 138 138  K 3.3*  --  4.2 3.4* 3.6  CL 104  --  105 102 103  CO2 25  --  17* 26 26  ANIONGAP 10  --  15 10 9   GLUCOSE 105*  --  136* 142* 130*  BUN 11  --  11 16 16   CREATININE 0.85  --  0.75 0.80 0.79  AST 30  --   --   --   --   ALT 30  --   --   --   --   ALKPHOS 57  --   --   --   --   BILITOT 0.8  --   --   --   --   ALBUMIN 4.4  --   --   --   --   INR  --  1.0  --   --   --   HGBA1C  --   --   --  5.8*  --   MG  --   --   --   --  2.3  CALCIUM  9.4  --  9.7 9.7 9.5    Total Time in preparing paper work, data evaluation and todays  exam - 35 minutes  Signature  -    Susa Raring M.D on 03/31/2023 at 8:04 AM   -  To page go to [email or web address]

## 2023-03-31 NOTE — TOC Transition Note (Signed)
Transition of Care [facility]) - CM/SW Discharge Note   Patient Details  Name: Jamie Watson MRN: [id number] Date of Birth: 08/06/1970  Transition of Care [facility]) CM/SW Contact:  Gordy Clement, RN Phone Number: 03/31/2023, 8:37 AM   Clinical Narrative:     Patient will DC to home today-  Rolling Walker to be delivered bedside prior to DC form [employer]  Home health PT and OT will be provided by [facility].  Patient's Sister will provide transportation home  AVS updated   No additional TOC needs         Patient Goals and CMS Choice      Discharge Placement                         Discharge Plan and Services Additional resources added to the After Visit Summary for                                       Social Determinants of Health (SDOH) Interventions SDOH Screenings   Depression (PHQ2-9): Low Risk  (01/28/2022)  Tobacco Use: Medium Risk (03/26/2023)     Readmission Risk Interventions     No data to display

## 2023-03-31 NOTE — Progress Notes (Addendum)
Physical Therapy Treatment Patient Details Name: Jamie Watson MRN: [id number] DOB: 23-Nov-1970 Today's Date: 03/31/2023   History of Present Illness 52yo female who presented to the ED on 9/18 due to increased malaise, dizziness, B hand weakness, R UE weakness, gait deviations due to R LE weakness/numbness, and vision changes in L eye >R eye. MRI with contrast shows multiple white matter lesions suggestive of possible demyelination. She endorses similar sx have come intermittently over the past several years.Admitted for further w/u. PMH fatigue, HTN, irriegular HR    PT Comments  Pt sitting EOB on arrival, reports feeling much better. She demo mod I bed mobility and transfers. CGA amb 400' without AD and ascend/descend 1 flight of stairs with bilat rails. Pt reports occasional R knee hyperextension which throws her off balance. No episodes during today's session. Pt would benefit from RW for home to assist with maximizing mobility and decreasing fall risk. Pt advised to initially have someone with her for stairs. Pt verbalizes understanding and reports family/friend able to provide needed level of assist. Pt asking about brace for R knee. Advised to try over the counter neoprene sleeve for increased proprioceptive input.    If plan is discharge home, recommend the following: Assistance with cooking/housework;Assist for transportation;A little help with bathing/dressing/bathroom;Help with stairs or ramp for entrance   Can travel by private vehicle        Equipment Recommendations  Rolling walker (2 wheels);Other (comment) (shower seat)    Recommendations for Other Services       Precautions / Restrictions Precautions Precautions: Fall Precaution Comments: R side weakness     Mobility  Bed Mobility Overal bed mobility: Modified Independent                  Transfers Overall transfer level: Modified independent Equipment used: None                     Ambulation/Gait Ambulation/Gait assistance: Contact guard assist Gait Distance (Feet): 400 Feet Assistive device: None Gait Pattern/deviations: Narrow base of support, Step-through pattern Gait velocity: decreased Gait velocity interpretation: 1.31 - 2.62 ft/sec, indicative of limited community ambulator   General Gait Details: slow, guarded gait. Pt reports occasional hyperextension R knee which throws her off balance. No episodes during PT session.   Stairs Stairs: Yes Stairs assistance: Contact guard assist Stair Management: Two rails, Step to pattern, Forwards Number of Stairs: 12     Wheelchair Mobility     Tilt Bed    Modified Rankin (Stroke Patients Only)       Balance Overall balance assessment: Needs assistance Sitting-balance support: Feet supported, No upper extremity supported Sitting balance-Leahy Scale: Good     Standing balance support: No upper extremity supported, During functional activity Standing balance-Leahy Scale: Fair                              Cognition Arousal: Alert Behavior During Therapy: WFL for tasks assessed/performed Overall Cognitive Status: Within Functional Limits for tasks assessed                                          Exercises      General Comments        Pertinent Vitals/Pain Pain Assessment Pain Assessment: No/denies pain    Home Living  Prior Function            PT Goals (current goals can now be found in the care plan section) Acute Rehab PT Goals Patient Stated Goal: home Progress towards PT goals: Progressing toward goals    Frequency    Min 1X/week      PT Plan      Co-evaluation              AM-PAC PT "6 Clicks" Mobility   Outcome Measure  Help needed turning from your back to your side while in a flat bed without using bedrails?: None Help needed moving from lying on your back to sitting on the side of a flat  bed without using bedrails?: None Help needed moving to and from a bed to a chair (including a wheelchair)?: None Help needed standing up from a chair using your arms (e.g., wheelchair or bedside chair)?: None Help needed to walk in hospital room?: A Little Help needed climbing 3-5 steps with a railing? : A Little 6 Click Score: 22    End of Session Equipment Utilized During Treatment: Gait belt Activity Tolerance: Patient tolerated treatment well Patient left: in bed;with call bell/phone within reach Nurse Communication: Mobility status PT Visit Diagnosis: Unsteadiness on feet (R26.81);Other abnormalities of gait and mobility (R26.89);Difficulty in walking, not elsewhere classified (R26.2);Other symptoms and signs involving the nervous system (R29.898)     Time: 9528-4132 PT Time Calculation (min) (ACUTE ONLY): 24 min  Charges:    $Gait Training: 23-37 mins PT General Charges $$ ACUTE PT VISIT: 1 Visit                     Ferd Glassing., PT  Office # [phone]    Ilda Foil 03/31/2023, 8:37 AM

## 2023-04-01 ENCOUNTER — Telehealth: Payer: Self-pay

## 2023-04-01 NOTE — Transitions of Care (Post Inpatient/ED Visit) (Unsigned)
04/01/2023  Name: Jamie Watson MRN: [id number] DOB: 12-19-1970  Today's TOC FU Call Status: Today's TOC FU Call Status:: Unsuccessful Call (1st Attempt) Unsuccessful Call (1st Attempt) Date: 04/01/23  Attempted to reach the patient regarding the most recent Inpatient/ED visit.  Follow Up Plan: Additional outreach attempts will be made to reach the patient to complete the Transitions of Care (Post Inpatient/ED visit) call.   Signature Karena Addison, LPN [facility] Nurse Health Advisor Direct Dial [phone]

## 2023-04-02 ENCOUNTER — Ambulatory Visit: Payer: Commercial Managed Care - PPO | Admitting: Family Medicine

## 2023-04-02 ENCOUNTER — Encounter: Payer: Self-pay | Admitting: Family Medicine

## 2023-04-02 VITALS — BP 102/66 | HR 102 | Temp 98.0°F | Ht 66.0 in | Wt 199.0 lb

## 2023-04-02 DIAGNOSIS — E876 Hypokalemia: Secondary | ICD-10-CM

## 2023-04-02 DIAGNOSIS — E559 Vitamin D deficiency, unspecified: Secondary | ICD-10-CM

## 2023-04-02 DIAGNOSIS — G35 Multiple sclerosis: Secondary | ICD-10-CM | POA: Diagnosis not present

## 2023-04-02 LAB — CBC
HCT: 45.3 % (ref 36.0–46.0)
Hemoglobin: 15 g/dL (ref 12.0–15.0)
MCHC: 33 g/dL (ref 30.0–36.0)
MCV: 91.3 fl (ref 78.0–100.0)
Platelets: 289 10*3/uL (ref 150.0–400.0)
RBC: 4.96 Mil/uL (ref 3.87–5.11)
RDW: 13.6 % (ref 11.5–15.5)
WBC: 5.9 10*3/uL (ref 4.0–10.5)

## 2023-04-02 LAB — COMPREHENSIVE METABOLIC PANEL
ALT: 33 U/L (ref 0–35)
AST: 13 U/L (ref 0–37)
Albumin: 4.2 g/dL (ref 3.5–5.2)
Alkaline Phosphatase: 58 U/L (ref 39–117)
BUN: 18 mg/dL (ref 6–23)
CO2: 30 mEq/L (ref 19–32)
Calcium: 9.2 mg/dL (ref 8.4–10.5)
Chloride: 100 mEq/L (ref 96–112)
Creatinine, Ser: 0.87 mg/dL (ref 0.40–1.20)
GFR: 76.56 mL/min (ref >60.00)
Glucose, Bld: 104 mg/dL — ABNORMAL HIGH (ref 70–99)
Potassium: 3.1 mEq/L — ABNORMAL LOW (ref 3.5–5.1)
Sodium: 139 mEq/L (ref 135–145)
Total Bilirubin: 1 mg/dL (ref 0.2–1.2)
Total Protein: 6.6 g/dL (ref 6.0–8.3)

## 2023-04-02 LAB — TSH: TSH: 2.09 u[IU]/mL (ref 0.35–5.50)

## 2023-04-02 LAB — VITAMIN D 25 HYDROXY (VIT D DEFICIENCY, FRACTURES): VITD: <7 ng/mL — ABNORMAL LOW (ref 30.00–100.00)

## 2023-04-02 LAB — MAGNESIUM: Magnesium: 2.2 mg/dL (ref 1.5–2.5)

## 2023-04-02 LAB — VITAMIN B12: Vitamin B-12: 373 pg/mL (ref 211–911)

## 2023-04-02 MED ORDER — VITAMIN D (ERGOCALCIFEROL) 1.25 MG (50000 UNIT) PO CAPS
50000.0000 [IU] | ORAL_CAPSULE | ORAL | 0 refills | Status: AC
Start: 2023-04-02 — End: ?
  Filled 2023-04-02: qty 12, 84d supply, fill #0

## 2023-04-02 MED ORDER — POTASSIUM CHLORIDE CRYS ER 10 MEQ PO TBCR
10.0000 meq | EXTENDED_RELEASE_TABLET | Freq: Two times a day (BID) | ORAL | 0 refills | Status: DC
Start: 2023-04-02 — End: 2023-04-17
  Filled 2023-04-02: qty 60, 30d supply, fill #0

## 2023-04-02 NOTE — Transitions of Care (Post Inpatient/ED Visit) (Signed)
04/02/2023  Name: Jamie Watson MRN: [id number] DOB: 1971/01/07  Today's TOC FU Call Status: Today's TOC FU Call Status:: Unsuccessful Call (1st Attempt) Unsuccessful Call (1st Attempt) Date: 04/01/23  Attempted to reach the patient regarding the most recent Inpatient/ED visit.  Follow Up Plan: No further outreach attempts will be made at this time. We have been unable to contact the patient. Patient already seen in office Signature Karena Addison, LPN [facility] Nurse Health Advisor Direct Dial [phone]

## 2023-04-02 NOTE — Assessment & Plan Note (Addendum)
Ms. Finnigan has compelted a steroid course treating her flare of MS. She is scheduled next week to see Dr. Epimenio Foot (neurology). As she is in transition, I recommend she stay off work for 2 more weeks. I counseled her regarding approaching her new diagnosis in steps and not assuming the worst outcome. I will check labs as recommended by the discharge physician. I will plan to see her back in 1 month to reassess.

## 2023-04-02 NOTE — Addendum Note (Signed)
Addended by: Loyola Mast on: 04/02/2023 06:08 PM   Modules accepted: Orders

## 2023-04-02 NOTE — Progress Notes (Signed)
[facility] [address] [place] [address] Dept: [phone] Dept Fax: [phone]  Office Visit  Subjective:    Patient ID: Jamie Watson, female    DOB: 1971/03/15, 52 y.o..   MRN: [id number]  Chief Complaint  Patient presents with   Hospitalization Follow-up    Hospital f/u from 03/27/23.     History of Present Illness:  Patient is in today for hospital follow-up. Jamie Watson was admitted at [facility] from 9/19-9/23/2024 with left-sided numbness and weakness and loss of dexterity in her right hand. Her MRI of the brain showed evidence for multiple sclerosis. She was treated with IV steroids. Jamie Watson notes that she is continuing to process what this diagnosis means for her. She has very supportive family and friends. She is noting some improvements in use of her right hand, but continues to have some numbness and pain int he right side of her body.  Past Medical History: Patient Active Problem List   Diagnosis Date Noted   Weakness 03/27/2023   MS (multiple sclerosis) (HCC) 03/27/2023   Hypokalemia 03/27/2023   Obesity (BMI 30-39.9) 03/27/2023   Borderline hyperlipidemia 01/31/2022   Prediabetes 01/31/2022   Essential hypertension 01/28/2022   Marijuana use, continuous 01/28/2022   Past Surgical History:  Procedure Laterality Date   TONSILLECTOMY AND ADENOIDECTOMY     Family History  Problem Relation Age of Onset   Heart disease Mother    Atrial fibrillation Mother    Cancer Father        Brain   Diabetes Father    Stroke Maternal Aunt    Cancer Maternal Aunt        Stomach   Diabetes Maternal Uncle    Diabetes Maternal Uncle    Diabetes Maternal Uncle    Diabetes Maternal Grandmother    Stroke Maternal Grandmother    Heart disease Maternal Grandfather    Outpatient Medications Prior to Visit  Medication Sig Dispense Refill   amLODipine (NORVASC) 5 MG tablet Take 1 tablet (5 mg total) by mouth at bedtime.  90 tablet 3   Blood Pressure Monitoring (OMRON 3 SERIES BP MONITOR) DEVI USE AS DIRECTED 1 each 0   pantoprazole (PROTONIX) 40 MG tablet Take 1 tablet (40 mg total) by mouth daily. 15 tablet 0   valsartan-hydrochlorothiazide (DIOVAN-HCT) 160-12.5 MG tablet Take 1 tablet by mouth daily. 90 tablet 3   No facility-administered medications prior to visit.   No Known Allergies   Objective:   Today's Vitals   04/02/23 1101  BP: 102/66  Pulse: (!) 102  Temp: 98 F (36.7 C)  TempSrc: Temporal  SpO2: 98%  Weight: 199 lb (90.3 kg)  Height: 5\' 6"  (1.676 m)   Body mass index is 32.12 kg/m.   General: Well developed, well nourished. No acute distress. Neuro: Grip and dexterity int he right hand is improved. Psych: Alert and oriented. Normal mood and affect.  Health Maintenance Due  Topic Date Due   Colonoscopy  09/22/2017   Zoster Vaccines- Shingrix (1 of 2) Never done   DTaP/Tdap/Td (3 - Tdap) 07/20/2022   Imaging: CT of Head (03/26/2023) IMPRESSION: No acute intracranial process.  MR of Brain wo contrast (03/27/2023) IMPRESSION: Bilateral, nodular T2/FLAIR hyperintense cerebral white matter lesions in a configuration strongly suggesting Multiple Sclerosis. No evidence of active demyelination on this noncontrast exam.  MR of Brain w contrast (03/27/2023) Mr of Cervical Spine w wo contrast IMPRESSION: 1. Multiple T2 hyperintense lesions in the  juxtacortical and periventricular white matter, suggestive of demyelinating disease. No enhancement to suggest acute demyelination. 2. Moderately motion degraded cervical spine MRI. Within this limitation, probable demyelinating lesions in the dorsal cord at the C3-4 and T2 levels. No enhancement to suggest acute demyelination. 3. Cervical spondylosis, worst at C5-C6, where there is mild spinal canal stenosis and severe bilateral neural foraminal narrowing.    Assessment & Plan:   Problem List Items Addressed This Visit       Nervous and  Auditory   MS (multiple sclerosis) (HCC) - Primary    Ms. Shill has compelted a steroid course treating her flare of MS. She is scheduled next week to see Dr. Epimenio Foot (neurology). As she is in transition, I recommend she stay off work for 2 more weeks. I counseled her regarding approaching her new diagnosis in steps and not assuming the worst outcome. I will check labs as recommended by the discharge physician. I will plan to see her back in 1 month to reassess.      Relevant Orders   CBC   Comprehensive metabolic panel   VITAMIN D 25 Hydroxy (Vit-D Deficiency, Fractures)   Vitamin B12   TSH   Magnesium    Return in about 4 weeks (around 04/30/2023) for Reassessment.   Loyola Mast, MD

## 2023-04-03 ENCOUNTER — Other Ambulatory Visit (HOSPITAL_COMMUNITY): Payer: Self-pay

## 2023-04-06 NOTE — Progress Notes (Unsigned)
[facility]  PATIENT: Jamie Watson DOB: 1970/09/24  REFERRING DOCTOR OR PCP: Bing Neighbors, MD; Chrisandra Carota, MD SOURCE: Patient, note from recent hospitalization, imaging and lab reports, MRI images personally reviewed.  _________________________________   HISTORICAL  CHIEF COMPLAINT:  Chief Complaint  Patient presents with   Room 11    Pt is here Alone. Pt states that she has been having a lot of fatigue. Pt states that her whole left side of her body is numb. Pt states that she is having some tingling on her left side. Pt denies and pain or muscle spasms. Pt states that she has blurry vision in her left eye. Pt states that she is having Vertigo symptoms. Pt states that she is having balance issues. Pt states that she is have squeezing around her mid section. Pt states that she has a knot that has showed up on her right arm that is ve    HISTORY OF PRESENT ILLNESS:  I had the pleasure of seeing your patient, Jamie Watson, at the MS center at [facility] for neurologic consultation regarding her recent diagnosis of multiple sclerosis.  She is a 52 year old woman with hypertension and chronic headaches who presented to the emergency room 03/26/2023 with left hand numbness and weakness.  She also had right-sided clumsiness. She went to the [facility] and CT was negative and then went to the [facility] ED.    Imaging studies showed T2 hyperintense foci in the cerebral hemispheres and in the spinal cord consistent with multiple sclerosis.  A focus in the posterior spinal cord at C3-C4 had subtle enhancement, slightly more to the right.  She was admitted and received 5 days of IV Solu-Medrol.     She felt symptoms improved  Also of notes 5-6 weeks earlier, she had the onset of vertigo without positional element.  This was constant a couple weeks and has been improving though not resolved.   She also has had some spasms in her legs, right arm and back.  Earlier in 2024 she had the onset of urinary incontinence  Currently, balance is still a little off and she use the bannister on stairs.   Her right leg is weaker than the left.  Her left arm remains numb.  Leg sensation is more symmetric.   No painful dysesthesias although left arm feels cold and water running on it is uncomfortable.  Though right arm and leg are clumsy.   Grip and writing improved.      She notes urinary urgency but the incontinence has resolved.  She notes reduced left vision the past several years, even before any other neurologic symptom.  She notes fatigue the past few months, better though after the steroid.   She has had some anxiety.    She has insomnia and has a lot of thoughts running through her head.    She has a couple second cousins with MS but no close relative  Imaging: MRI of the brain 03/27/2023 showed scattered T2/FLAIR hyperintense foci in the periventricular, juxtacortical and deep white matter of the cerebral hemispheres.  Normal enhancement pattern.  MRI of the cervical spine 03/27/2023 showed T2 hyperintense foci within the spinal cord.   Posteriorly adjacent to C2-C3, posteriorly, adjacent to C3-C4, and, on sagittal images adjacent to T2.   The focus at C3-C4 enhances after contrast consistent with acute demyelination  Laboratory: September 2024: Vitamin D was very low (less than 7) , pregnancy at test, HIV antibody, ANA,  neuromyelitis optica IgG were normal or negative. REVIEW OF SYSTEMS: Constitutional: No fevers, chills, sweats, or change in appetite Eyes: No visual changes, double vision, eye pain Ear, nose and throat: No hearing loss, ear pain, nasal congestion, sore throat Cardiovascular: No chest pain, palpitations Respiratory:  No shortness of breath at rest or with exertion.   No wheezes GastrointestinaI: No nausea, vomiting, diarrhea, abdominal pain, fecal incontinence Genitourinary:  No dysuria, urinary retention or frequency.  No  nocturia. Musculoskeletal:  No neck pain, back pain Integumentary: No rash, pruritus, skin lesions Neurological: as above Psychiatric: No depression at this time.  No anxiety Endocrine: No palpitations, diaphoresis, change in appetite, change in weigh or increased thirst Hematologic/Lymphatic:  No anemia, purpura, petechiae. Allergic/Immunologic: No itchy/runny eyes, nasal congestion, recent allergic reactions, rashes  ALLERGIES: No Known Allergies  HOME MEDICATIONS:  Current Outpatient Medications:    amLODipine (NORVASC) 5 MG tablet, Take 1 tablet (5 mg total) by mouth at bedtime., Disp: 90 tablet, Rfl: 3   Blood Pressure Monitoring (OMRON 3 SERIES BP MONITOR) DEVI, USE AS DIRECTED, Disp: 1 each, Rfl: 0   pantoprazole (PROTONIX) 40 MG tablet, Take 1 tablet (40 mg total) by mouth daily., Disp: 15 tablet, Rfl: 0   potassium chloride (KLOR-CON M) 10 MEQ tablet, Take 1 tablet (10 mEq total) by mouth 2 (two) times daily., Disp: 60 tablet, Rfl: 0   valsartan-hydrochlorothiazide (DIOVAN-HCT) 160-12.5 MG tablet, Take 1 tablet by mouth daily., Disp: 90 tablet, Rfl: 3   Vitamin D, Ergocalciferol, (DRISDOL) 1.25 MG (50000 UNIT) CAPS capsule, Take 1 capsule (50,000 Units total) by mouth every 7 (seven) days., Disp: 12 capsule, Rfl: 3  PAST MEDICAL HISTORY: Past Medical History:  Diagnosis Date   Acute pain of left knee 04/19/2019   Chronic headaches    Fatigue    Hypertension    Irregular heart beat    Menopause    Migraine    Multiple sclerosis (HCC)    Right arm pain 04/19/2019   Seasonal allergies    Snoring    Weight gain     PAST SURGICAL HISTORY: Past Surgical History:  Procedure Laterality Date   TONSILLECTOMY AND ADENOIDECTOMY      FAMILY HISTORY: Family History  Problem Relation Age of Onset   Heart disease Mother    Atrial fibrillation Mother    Cancer Father        Brain   Diabetes Father    Stroke Maternal Aunt    Cancer Maternal Aunt        Stomach    Diabetes Maternal Uncle    Diabetes Maternal Uncle    Diabetes Maternal Uncle    Diabetes Maternal Grandmother    Stroke Maternal Grandmother    Heart disease Maternal Grandfather     SOCIAL HISTORY: Social History   Socioeconomic History   Marital status: Legally Separated    Spouse name: Not on file   Number of children: 0   Years of education: Not on file   Highest education level: Bachelor's degree (e.g., BA, AB, BS)  Occupational History   Not on file  Tobacco Use   Smoking status: Former    Current packs/day: 0.00    Types: Cigarettes    Quit date: 2012    Years since quitting: 12.7   Smokeless tobacco: Never  Vaping Use   Vaping status: Never Used  Substance and Sexual Activity   Alcohol use: Yes    Comment: Rarely   Drug use: Yes  Types: Marijuana    Comment: Daily use   Sexual activity: Yes    Birth control/protection: None  Other Topics Concern   Not on file  Social History Narrative   Right Handed   1 Cup of Coffee per Day   Social Determinants of Health   Financial Resource Strain: Not on file  Food Insecurity: Not on file  Transportation Needs: Not on file  Physical Activity: Not on file  Stress: Not on file  Social Connections: Not on file  Intimate Partner Violence: Not on file       PHYSICAL EXAM  Vitals:   04/08/23 1337  BP: 103/73  Pulse: (!) 111  Weight: 197 lb 8 oz (89.6 kg)  Height: 5\' 5"  (1.651 m)    Body mass index is 32.87 kg/m.   General: The patient is well-developed and well-nourished and in no acute distress  HEENT:  Head is [place]/AT.  Sclera are anicteric.  Funduscopic exam shows normal optic discs and retinal vessels.  Neck: No carotid bruits are noted.  The neck is nontender.  Cardiovascular: The heart has a regular rate and rhythm with a normal S1 and S2. There were no murmurs, gallops or rubs.    Skin: Extremities are without rash or  edema.  Musculoskeletal:  Back is nontender  Neurologic Exam  Mental  status: The patient is alert and oriented x 3 at the time of the examination. The patient has apparent normal recent and remote memory, with an apparently normal attention span and concentration ability.   Speech is normal.  Cranial nerves: Extraocular movements are full. Pupils are equal, round, and reactive to light and accomodation. Reduced color saturation OS.  Facial symmetry is present. There is good facial sensation to soft touch bilaterally.Facial strength is normal.  Trapezius and sternocleidomastoid strength is normal. No dysarthria is noted.  The tongue is midline, and the patient has symmetric elevation of the soft palate. No obvious hearing deficits are noted.  Motor:  Muscle bulk is normal.   Tone is normal. Strength is  5 / 5 in all 4 extremities.   Sensory: Sensory testing is intact to vibration sensation in all 4 extremities.  Reduced touch, temperature, pinprick left arm relative to right.  Slightly altered left leg touch.   Coordination: Cerebellar testing reveals good finger-nose-finger and mildly reduced heel-to-shin bilaterally.  Gait and station: Station is normal.   Gait is slightly wide.   No foot drop.  Tandem is moderately wide.  Romberg is negative.   Reflexes: Deep tendon reflexes are symmetric and normal in the arms and mildly increased (3) in legs. .   Plantar responses are flexor.    DIAGNOSTIC DATA (LABS, IMAGING, TESTING) - I reviewed patient records, labs, notes, testing and imaging myself where available.  Lab Results  Component Value Date   WBC 5.9 04/02/2023   HGB 15.0 04/02/2023   HCT 45.3 04/02/2023   MCV 91.3 04/02/2023   PLT 289.0 04/02/2023      Component Value Date/Time   NA 139 04/02/2023 1130   K 3.1 (L) 04/02/2023 1130   CL 100 04/02/2023 1130   CO2 30 04/02/2023 1130   GLUCOSE 104 (H) 04/02/2023 1130   BUN 18 04/02/2023 1130   CREATININE 0.87 04/02/2023 1130   CALCIUM 9.2 04/02/2023 1130   PROT 6.6 04/02/2023 1130   ALBUMIN 4.2  04/02/2023 1130   AST 13 04/02/2023 1130   ALT 33 04/02/2023 1130   ALKPHOS 58 04/02/2023 1130  BILITOT 1.0 04/02/2023 1130   GFRNONAA >60 03/30/2023 0857   Lab Results  Component Value Date   CHOL 219 (H) 01/31/2022   HDL 70.50 01/31/2022   LDLCALC 129 (H) 01/31/2022   TRIG 100.0 01/31/2022   CHOLHDL 3 01/31/2022   Lab Results  Component Value Date   HGBA1C 5.8 (H) 03/29/2023   Lab Results  Component Value Date   VITAMINB12 373 04/02/2023   Lab Results  Component Value Date   TSH 2.09 04/02/2023       ASSESSMENT AND PLAN  MS (multiple sclerosis) (HCC) - Plan: Hepatitis B surface antigen, IgG, IgA, IgM, QuantiFERON-TB Gold Plus, Hepatitis B core antibody, total, Hepatitis B surface antibody,qualitative, Varicella zoster antibody, IgG  Vitamin D deficiency - Plan: Vitamin D, Ergocalciferol, (DRISDOL) 1.25 MG (50000 UNIT) CAPS capsule  High risk medication use - Plan: Hepatitis B surface antigen, IgG, IgA, IgM, QuantiFERON-TB Gold Plus, Hepatitis B core antibody, total, Hepatitis B surface antibody,qualitative, Varicella zoster antibody, IgG  Gait disturbance  Numbness   In summary, Ms. Olvey is a 52 year old woman with recent neurologic symptoms including numbness and clumsiness who was found on MRI of the brain and cervical spine to have lesions consistent with multiple sclerosis.  She has at least 3 lesions in the spinal cord including 1 at C3-C4 that enhances and likely explains her recent symptoms.  The focus at T1-T2 could explain the leg symptoms that occurred earlier this year.  I discussed with her that because she is presenting in a more aggressive way with several spinal cord plaques and significant exacerbations that I recommend that we begin a highly effective disease modifying therapy from the outset.  We discussed Ocrevus and Briumvi as being effective therapeutic options for her.  I will check some blood work to make sure she is a candidate.  I also  discussed that she could get the shingles vaccination before she begins.  Her vitamin D is low and she should take 50,000 units weekly for a year.  We will recheck the level next year and consider dropping down to OTC supplements.  If the sensory symptoms worsen we will need to consider gabapentin or another medication.  Thank you for asking to see Ms. Phommachanh.  She will return to see me in 3 to 4 months or sooner if there are new or worsening neurologic symptoms.  Please let me know if I can be of further assistance with her or other patients in the future.    Janthony Holleman A. Epimenio Foot, MD, [facility] 04/08/2023, 2:32 PM Certified in Neurology, Clinical Neurophysiology, Sleep Medicine and Neuroimaging  [facility] [address] [place], [place] [address] [phone]

## 2023-04-08 ENCOUNTER — Encounter: Payer: Self-pay | Admitting: Neurology

## 2023-04-08 ENCOUNTER — Other Ambulatory Visit (HOSPITAL_COMMUNITY): Payer: Self-pay

## 2023-04-08 ENCOUNTER — Ambulatory Visit: Payer: Commercial Managed Care - PPO | Admitting: Neurology

## 2023-04-08 VITALS — BP 103/73 | HR 111 | Ht 65.0 in | Wt 197.5 lb

## 2023-04-08 DIAGNOSIS — Z79899 Other long term (current) drug therapy: Secondary | ICD-10-CM

## 2023-04-08 DIAGNOSIS — R269 Unspecified abnormalities of gait and mobility: Secondary | ICD-10-CM

## 2023-04-08 DIAGNOSIS — G35 Multiple sclerosis: Secondary | ICD-10-CM | POA: Diagnosis not present

## 2023-04-08 DIAGNOSIS — R200 Anesthesia of skin: Secondary | ICD-10-CM

## 2023-04-08 DIAGNOSIS — E559 Vitamin D deficiency, unspecified: Secondary | ICD-10-CM | POA: Diagnosis not present

## 2023-04-08 MED ORDER — VITAMIN D (ERGOCALCIFEROL) 1.25 MG (50000 UNIT) PO CAPS
50000.0000 [IU] | ORAL_CAPSULE | ORAL | 3 refills | Status: DC
  Filled 2023-04-08: qty 12, 84d supply, fill #0

## 2023-04-11 LAB — QUANTIFERON-TB GOLD PLUS
QuantiFERON Mitogen Value: >10 [IU]/mL
QuantiFERON Nil Value: 0.04 [IU]/mL
QuantiFERON TB1 Ag Value: 0.04 [IU]/mL
QuantiFERON TB2 Ag Value: 0.05 [IU]/mL
QuantiFERON-TB Gold Plus: NEGATIVE

## 2023-04-11 LAB — HEPATITIS B CORE ANTIBODY, TOTAL: Hep B Core Total Ab: NEGATIVE

## 2023-04-11 LAB — VARICELLA ZOSTER ANTIBODY, IGG: Varicella zoster IgG: REACTIVE

## 2023-04-11 LAB — HEPATITIS B SURFACE ANTIGEN: Hepatitis B Surface Ag: NEGATIVE

## 2023-04-11 LAB — HEPATITIS B SURFACE ANTIBODY,QUALITATIVE: Hep B Surface Ab, Qual: REACTIVE

## 2023-04-11 LAB — IGG, IGA, IGM
IgA/Immunoglobulin A, Serum: 183 mg/dL (ref 87–352)
IgG (Immunoglobin G), Serum: 915 mg/dL (ref 586–1602)
IgM (Immunoglobulin M), Srm: 77 mg/dL (ref 26–217)

## 2023-04-14 DIAGNOSIS — Z0289 Encounter for other administrative examinations: Secondary | ICD-10-CM

## 2023-04-15 ENCOUNTER — Telehealth: Payer: Self-pay | Admitting: *Deleted

## 2023-04-15 NOTE — Telephone Encounter (Signed)
Called pt. Relayed results per Dr. Epimenio Foot. She verbalized understanding. She would like to go with Ocrevus first. Explained approval process can take 2-3 weeks. Will submit to our infusion suite. Once they get official approval, they will call to get her scheduled.  I explained premeds she will receive and loading/maintenance dosing. Also explained she will have 1 hr observation period after getting infusion.   Advised her to be on look out for call from [place]. We will fax in form today. Aware they will want to verify her info and go over financial screening.  Faxed complete/signed Ocrevus start form to [employer] at [phone]. Received fax confirmation. Gave completed start form to [facility] for them to process. Included signed order below.

## 2023-04-15 NOTE — Telephone Encounter (Signed)
Pt [employer] form faxed on 04/15/2023

## 2023-04-15 NOTE — Telephone Encounter (Signed)
-----   Message from Asa Lente sent at 04/14/2023  5:36 PM EDT ----- The lab work is fine for her to get started on either Ocrevus or Briumvi, whichever 1 is easier with her insurance company

## 2023-04-17 ENCOUNTER — Encounter: Payer: Self-pay | Admitting: *Deleted

## 2023-04-17 ENCOUNTER — Ambulatory Visit: Payer: Commercial Managed Care - PPO | Admitting: Family Medicine

## 2023-04-17 ENCOUNTER — Other Ambulatory Visit (HOSPITAL_COMMUNITY): Payer: Self-pay

## 2023-04-17 VITALS — BP 118/68 | HR 81 | Temp 97.6°F | Wt 199.2 lb

## 2023-04-17 DIAGNOSIS — I10 Essential (primary) hypertension: Secondary | ICD-10-CM

## 2023-04-17 DIAGNOSIS — R42 Dizziness and giddiness: Secondary | ICD-10-CM | POA: Diagnosis not present

## 2023-04-17 DIAGNOSIS — I889 Nonspecific lymphadenitis, unspecified: Secondary | ICD-10-CM | POA: Diagnosis not present

## 2023-04-17 DIAGNOSIS — G35 Multiple sclerosis: Secondary | ICD-10-CM

## 2023-04-17 DIAGNOSIS — E876 Hypokalemia: Secondary | ICD-10-CM | POA: Diagnosis not present

## 2023-04-17 DIAGNOSIS — G35D Multiple sclerosis, unspecified: Secondary | ICD-10-CM

## 2023-04-17 MED ORDER — POTASSIUM CHLORIDE CRYS ER 10 MEQ PO TBCR
10.0000 meq | EXTENDED_RELEASE_TABLET | Freq: Two times a day (BID) | ORAL | 3 refills | Status: DC
  Filled 2023-04-17: qty 90, 45d supply, fill #0

## 2023-04-17 MED ORDER — VALSARTAN-HYDROCHLOROTHIAZIDE 160-12.5 MG PO TABS
1.0000 | ORAL_TABLET | Freq: Every day | ORAL | 3 refills | Status: DC
  Filled 2023-04-17: qty 90, 90d supply, fill #0
  Filled 2023-09-04 – 2023-11-14 (×2): qty 90, 90d supply, fill #1
  Filled 2024-03-15: qty 90, 90d supply, fill #2

## 2023-04-17 MED ORDER — AMLODIPINE BESYLATE 5 MG PO TABS
5.0000 mg | ORAL_TABLET | Freq: Every day | ORAL | 3 refills | Status: DC
  Filled 2023-04-17: qty 90, 90d supply, fill #0
  Filled 2023-09-04 – 2023-11-14 (×2): qty 90, 90d supply, fill #1
  Filled 2024-03-15: qty 90, 90d supply, fill #2

## 2023-04-17 MED ORDER — MECLIZINE HCL 25 MG PO TABS
25.0000 mg | ORAL_TABLET | Freq: Three times a day (TID) | ORAL | 0 refills | Status: AC | PRN
  Filled 2023-04-17: qty 30, 10d supply, fill #0

## 2023-04-17 NOTE — Assessment & Plan Note (Signed)
Blood pressure is in good control. Continue amlodipine 5 mg daily and valsartan-HCTZ (Diovan-HCT) 160-12.5 mg daily.

## 2023-04-17 NOTE — Progress Notes (Signed)
[facility] [address] [place] [address] Dept: [phone] Dept Fax: [phone]  Office Visit  Subjective:    Patient ID: Jamie Watson, female    DOB: 1971-03-01, 52 y.o..   MRN: [id number]  Chief Complaint  Patient presents with   Dizziness    C/o having some dizziness off/on,  also having a knot on RT arm x 1 week.     History of Present Illness:  Patient is in today for to have a knot on her right arm assessed. [name] notes this came up about a week ago. This had been quite tender, but does seem to be going down in size.   [name] was recently diagnosed with multiple sclerosis. She was treated initially with IV steroids. She is having gradual improvements in the use of her right hand. She is now seeing Dr. Epimenio Foot. She states he did get approval to start her on ocrelizumab (Ocrevus).  [name] notes she has been having some vertigo symptoms for some time. She will have extended periods of a room spinning sensation. she also has some dysequilibrium to the right. Her most recent episode occurred on Tuesday. Changes in position do not seem to trigger her episodes. She denies any change in hearing or tinnitus.  Past Medical History: Patient Active Problem List   Diagnosis Date Noted   High risk medication use 04/08/2023   Vitamin D deficiency 04/08/2023   Weakness 03/27/2023   MS (multiple sclerosis) (HCC) 03/27/2023   Hypokalemia 03/27/2023   Obesity (BMI 30-39.9) 03/27/2023   Borderline hyperlipidemia 01/31/2022   Prediabetes 01/31/2022   Essential hypertension 01/28/2022   Marijuana use, continuous 01/28/2022   Past Surgical History:  Procedure Laterality Date   TONSILLECTOMY AND ADENOIDECTOMY     Family History  Problem Relation Age of Onset   Heart disease Mother    Atrial fibrillation Mother    Cancer Father        Brain   Diabetes Father    Stroke Maternal Aunt    Cancer Maternal Aunt         Stomach   Diabetes Maternal Uncle    Diabetes Maternal Uncle    Diabetes Maternal Uncle    Diabetes Maternal Grandmother    Stroke Maternal Grandmother    Heart disease Maternal Grandfather    Outpatient Medications Prior to Visit  Medication Sig Dispense Refill   Blood Pressure Monitoring (OMRON 3 SERIES BP MONITOR) DEVI USE AS DIRECTED 1 each 0   pantoprazole (PROTONIX) 40 MG tablet Take 1 tablet (40 mg total) by mouth daily. 15 tablet 0   Vitamin D, Ergocalciferol, (DRISDOL) 1.25 MG (50000 UNIT) CAPS capsule Take 1 capsule (50,000 Units total) by mouth every 7 (seven) days. 12 capsule 3   amLODipine (NORVASC) 5 MG tablet Take 1 tablet (5 mg total) by mouth at bedtime. 90 tablet 3   potassium chloride (KLOR-CON M) 10 MEQ tablet Take 1 tablet (10 mEq total) by mouth 2 (two) times daily. 60 tablet 0   valsartan-hydrochlorothiazide (DIOVAN-HCT) 160-12.5 MG tablet Take 1 tablet by mouth daily. 90 tablet 3   No facility-administered medications prior to visit.   No Known Allergies   Objective:   Today's Vitals   04/17/23 1020  BP: 118/68  Pulse: 81  Temp: 97.6 F (36.4 C)  TempSrc: Temporal  SpO2: 99%  Weight: 199 lb 3.2 oz (90.4 kg)   Body mass index is 33.15 kg/m.   General: Well  developed, well nourished. No acute distress. HEENT: Normocephalic, non-traumatic. PERRL, EOMI. No nystagmus noted. Conjunctiva clear. External ears normal. EAC and TMs normal bilaterally. Psych: Alert and oriented. Normal mood and affect. Skin: There is a 0.5 x 1 cm subcutaneous, freely mobile nodule in the right antecubital area. This is c/w a lymph node. It is only mildly   tender.  Health Maintenance Due  Topic Date Due   Zoster Vaccines- Shingrix (1 of 2) Never done   Colonoscopy  09/22/2017   DTaP/Tdap/Td (3 - Tdap) 07/20/2022     Assessment & Plan:   Problem List Items Addressed This Visit       Cardiovascular and Mediastinum   Essential hypertension    Blood pressure is in good  control. Continue amlodipine 5 mg daily and valsartan-HCTZ (Diovan-HCT) 160-12.5 mg daily.       Relevant Medications   amLODipine (NORVASC) 5 MG tablet   valsartan-hydrochlorothiazide (DIOVAN-HCT) 160-12.5 MG tablet     Nervous and Auditory   MS (multiple sclerosis) (HCC)    [name] has has shown response to her steroids. She is working with Dr. Epimenio Foot (neurology) and hopes to start ocrelizumab (Ocrelvus) soon.        Other   Hypokalemia    Stable. Continue potassium supplement.      Relevant Medications   potassium chloride (KLOR-CON M) 10 MEQ tablet   Vertigo - Primary    Appears to be associated with her MS. I will prescribe meclizine for use during times of flare. This may improve as she starts treatment for her MS.      Relevant Medications   meclizine (ANTIVERT) 25 MG tablet   Other Visit Diagnoses     Lymphadenitis       Suspect this is a reactive lymph node form recent IVs. Recommend use of heat over this area daily and expectant management.       Return in about 3 months (around 07/18/2023) for Reassessment.   Loyola Mast, MD

## 2023-04-17 NOTE — Assessment & Plan Note (Signed)
Appears to be associated with her MS. I will prescribe meclizine for use during times of flare. This may improve as she starts treatment for her MS.

## 2023-04-17 NOTE — Assessment & Plan Note (Signed)
Stable Continue potassium supplement.

## 2023-04-17 NOTE — Assessment & Plan Note (Signed)
Ms. Wisnieski has has shown response to her steroids. She is working with Dr. Epimenio Foot (neurology) and hopes to start ocrelizumab (Ocrelvus) soon.

## 2023-04-18 DIAGNOSIS — Z0279 Encounter for issue of other medical certificate: Secondary | ICD-10-CM

## 2023-04-18 NOTE — Telephone Encounter (Signed)
error 

## 2023-04-18 NOTE — Telephone Encounter (Signed)
Pt came into office states they are wanting to keep her out of work until 05/15/2023 as of right now, but this may change. So continuous leave would probably be best option.   Pt is needing paperwork to be sent to [facility] also Fax: [phone] Claim number [employer]: [id number] Claim number [employer]: [id number]

## 2023-04-28 NOTE — Telephone Encounter (Signed)
Gave completed/signed form back to medical records to process for pt. 

## 2023-04-30 ENCOUNTER — Ambulatory Visit: Payer: Commercial Managed Care - PPO | Admitting: Family Medicine

## 2023-05-12 ENCOUNTER — Telehealth: Payer: Self-pay | Admitting: Family Medicine

## 2023-05-12 DIAGNOSIS — Z23 Encounter for immunization: Secondary | ICD-10-CM

## 2023-05-12 NOTE — Telephone Encounter (Signed)
Patient notified VIA phone and scheduled for first nurse visit.  Dm/cma

## 2023-05-12 NOTE — Telephone Encounter (Signed)
Pt came into office, states her job is needing a letter written about her being taken out of work. States her first infusion is scheduled for 8 am on 05/27/2023 and she would be getting off of work 7:30 am on 05/27/2023 from 3rd shift 7 day work week.   Pt thinks it may be best just to have her start date changed until 05/28/2023 when her first infusion is over.  Pt is out of PTO as well and there is no room for her to take off if something were to happen.  Would like a call to discuss  [phone]

## 2023-05-12 NOTE — Telephone Encounter (Signed)
Is this okay and can you please place the order? Thanks. Dm/cma

## 2023-05-12 NOTE — Telephone Encounter (Signed)
Pt was advised by her neurologist to get her shingles vaccine before she starts her MS infusion. She is wondering how long between each shot will be and she would like to schedule. Please advise pt at [phone]

## 2023-05-13 ENCOUNTER — Ambulatory Visit (INDEPENDENT_AMBULATORY_CARE_PROVIDER_SITE_OTHER): Payer: Commercial Managed Care - PPO

## 2023-05-13 ENCOUNTER — Ambulatory Visit: Payer: Commercial Managed Care - PPO

## 2023-05-13 DIAGNOSIS — Z23 Encounter for immunization: Secondary | ICD-10-CM

## 2023-05-13 NOTE — Progress Notes (Signed)
Patient presents for Shingrix Vaccine injection. Injection placed in left deltoid region. Patient tolerated procedure well with no concerns.

## 2023-05-15 NOTE — Telephone Encounter (Signed)
Patient calling in stating she really needs to have this letter from the doctor because her work is requesting it and she is still dealing with vertigo.

## 2023-05-15 NOTE — Telephone Encounter (Signed)
Called pt. Agreeable to have [place] update FMLA form to extend continuous leave until 05/28/23. Also detailed what MS flare up may include. She states management wants vertigo included. I updated form and gave to Stanton Kidney in medical records to send in today for her.

## 2023-05-27 DIAGNOSIS — G35 Multiple sclerosis: Secondary | ICD-10-CM | POA: Diagnosis not present

## 2023-06-02 ENCOUNTER — Other Ambulatory Visit: Payer: Self-pay | Admitting: Neurology

## 2023-06-02 ENCOUNTER — Encounter: Payer: Self-pay | Admitting: Neurology

## 2023-06-02 ENCOUNTER — Other Ambulatory Visit (HOSPITAL_COMMUNITY): Payer: Self-pay

## 2023-06-02 DIAGNOSIS — Z1231 Encounter for screening mammogram for malignant neoplasm of breast: Secondary | ICD-10-CM | POA: Diagnosis not present

## 2023-06-02 LAB — HM MAMMOGRAPHY

## 2023-06-02 MED ORDER — METHYLPREDNISOLONE 4 MG PO TBPK
ORAL_TABLET | ORAL | 0 refills | Status: DC
  Filled 2023-06-02: qty 21, 6d supply, fill #0

## 2023-06-03 NOTE — Telephone Encounter (Signed)
Patient's first Ocrevus infusion was 05/27/23.

## 2023-06-10 DIAGNOSIS — G35 Multiple sclerosis: Secondary | ICD-10-CM | POA: Diagnosis not present

## 2023-06-10 NOTE — Telephone Encounter (Signed)
Pt is requesting a copy of her FMLA forms. Pt would like a call when ready so she can pick them up. Thank you!

## 2023-07-18 ENCOUNTER — Other Ambulatory Visit (HOSPITAL_COMMUNITY): Payer: Self-pay

## 2023-07-18 ENCOUNTER — Ambulatory Visit: Payer: Commercial Managed Care - PPO | Admitting: Family Medicine

## 2023-07-18 VITALS — BP 126/84 | HR 87 | Temp 98.7°F | Ht 65.0 in | Wt 207.2 lb

## 2023-07-18 DIAGNOSIS — F321 Major depressive disorder, single episode, moderate: Secondary | ICD-10-CM | POA: Diagnosis not present

## 2023-07-18 DIAGNOSIS — G35 Multiple sclerosis: Secondary | ICD-10-CM | POA: Diagnosis not present

## 2023-07-18 DIAGNOSIS — R208 Other disturbances of skin sensation: Secondary | ICD-10-CM | POA: Diagnosis not present

## 2023-07-18 DIAGNOSIS — M79601 Pain in right arm: Secondary | ICD-10-CM | POA: Diagnosis not present

## 2023-07-18 MED ORDER — DULOXETINE HCL 20 MG PO CPEP
20.0000 mg | ORAL_CAPSULE | Freq: Two times a day (BID) | ORAL | 3 refills | Status: DC
Start: 2023-07-18 — End: 2023-11-13
  Filled 2023-07-18: qty 60, 30d supply, fill #0

## 2023-07-18 MED ORDER — PREGABALIN 25 MG PO CAPS
25.0000 mg | ORAL_CAPSULE | Freq: Two times a day (BID) | ORAL | 3 refills | Status: DC
Start: 2023-07-18 — End: 2023-12-02
  Filled 2023-07-18: qty 60, 30d supply, fill #0

## 2023-07-18 NOTE — Progress Notes (Signed)
 [facility] [address] [place] [address] Dept: [phone] Dept Fax: [phone]  Chronic Care Office Visit  Subjective:    Patient ID: Jamie Watson, female    DOB: 1970/09/07, 53 y.o..   MRN: [id number]  Chief Complaint  Patient presents with   Follow-up    3 month f/u. C/o having extreme pain in arms, balance issues and bladder issues.     History of Present Illness:  Patient is in today for reassessment of chronic medical issues.  Ms. Jurek was diagnosed in Sept. 2024 with multiple sclerosis. She was treated initially with IV steroids.  She is now seeing Dr. Vear. He has started her on an ocrelizumab  (Ocrevus ) infusion. Ms. Tutor notes that she is having pain in both of her arms. The left arm is primarily a sensation of cold or burning, esp, with even light touch. The right arm, is similar, but she also notes weakness and pain with use of the arm. She is noticing some balance issues, finding that she feels clumsy with her feet and they get angled at times, or that she has to step back to catch herself. 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 She notes that she currently works a 10-hr. shift, 7 days in a row, then has 7 days off. She is finding the long stretches of work are difficult to get through. She is also finding that opening and closing heavy doors in her department are troublesome. She has tried to work with her supervisor, but is being told there is nothing they can do to accommodate her issues other than put her on a shorter shift with less pay. Ms. Lisbon does feel she could manage with this currently. She is finding all of this to be overwhelming and notes she is feeling depressed. She finds herself to be tearful much of the time. She admits to fears about progressive disability from her MS.  Ms. Treptow has a history of hypertension. She is currently managed on valsartan -HCTZ 160-12.5 mg daily and amlodipine  5 mg  daily.   Past Medical History: Patient Active Problem List   Diagnosis Date Noted   Depression, major, single episode, moderate (HCC) 07/18/2023   Vertigo 04/17/2023   High risk medication use 04/08/2023   Vitamin D  deficiency 04/08/2023   Weakness 03/27/2023   MS (multiple sclerosis) (HCC) 03/27/2023   Hypokalemia 03/27/2023   Obesity (BMI 30-39.9) 03/27/2023   Borderline hyperlipidemia 01/31/2022   Prediabetes 01/31/2022   Essential hypertension 01/28/2022   Marijuana use, continuous 01/28/2022   Past Surgical History:  Procedure Laterality Date   TONSILLECTOMY AND ADENOIDECTOMY     Family History  Problem Relation Age of Onset   Heart disease Mother    Atrial fibrillation Mother    Cancer Father        Brain   Diabetes Father    Stroke Maternal Aunt    Cancer Maternal Aunt        Stomach   Diabetes Maternal Uncle    Diabetes Maternal Uncle    Diabetes Maternal Uncle    Diabetes Maternal Grandmother    Stroke Maternal Grandmother    Heart disease Maternal Grandfather    Outpatient Medications Prior to Visit  Medication Sig Dispense Refill   amLODipine  (NORVASC ) 5 MG tablet Take 1 tablet (5 mg total) by mouth at bedtime. 90 tablet 3   Blood Pressure Monitoring (OMRON 3 SERIES BP MONITOR) DEVI USE AS DIRECTED 1 each 0  meclizine  (ANTIVERT ) 25 MG tablet Take 1 tablet (25 mg total) by mouth 3 (three) times daily as needed for dizziness. 30 tablet 0   ocrelizumab  300 mg in sodium chloride  0.9 % 250 mL Inject 300 mg into the vein once.     potassium chloride  (KLOR-CON  M) 10 MEQ tablet Take 1 tablet (10 mEq total) by mouth 2 (two) times daily. 90 tablet 3   valsartan -hydrochlorothiazide  (DIOVAN -HCT) 160-12.5 MG tablet Take 1 tablet by mouth daily. 90 tablet 3   pantoprazole  (PROTONIX ) 40 MG tablet Take 1 tablet (40 mg total) by mouth daily. 15 tablet 0   Vitamin D , Ergocalciferol , (DRISDOL ) 1.25 MG (50000 UNIT) CAPS capsule Take 1 capsule (50,000 Units total) by mouth  every 7 (seven) days. 12 capsule 3   methylPREDNISolone  (MEDROL  DOSEPAK) 4 MG TBPK tablet take as directed. 6,5,4,3,2,1 21 tablet 0   No facility-administered medications prior to visit.   No Known Allergies Objective:   Today's Vitals   07/18/23 0951  BP: 126/84  Pulse: 87  Temp: 98.7 F (37.1 C)  TempSrc: Temporal  SpO2: 99%  Weight: 207 lb 3.2 oz (94 kg)  Height: 5' 5 (1.651 m)   Body mass index is 34.48 kg/m.   General: Well developed, well nourished. No acute distress. Extremities: Full ROM. Tenderness with palpation over the muscles and joints of both arms. There is some   popping in the right shoulder joint with gentle rocking and this is tender. No joint swelling. Psych: Alert and oriented. Normal mood and affect.  Health Maintenance Due  Topic Date Due   Colonoscopy  09/22/2017   DTaP/Tdap/Td (3 - Tdap) 07/20/2022   MAMMOGRAM  05/16/2023   Zoster Vaccines- Shingrix  (2 of 2) 07/08/2023     Assessment & Plan:   Problem List Items Addressed This Visit       Nervous and Auditory   MS (multiple sclerosis) (HCC) - Primary   Ms. Haneyis having progressive issues with balance, arm weakness, and allodynia. She is working with Dr. Vear (neurology) regarding treatment with ocrelizumab  [place]).      Relevant Medications   ocrelizumab  300 mg in sodium chloride  0.9 % 250 mL     Other   Allodynia   Likely secondary to her MS. I will start her on pregabalin  to see if we can improve her pain management. I will also start her on duloxetine .      Relevant Medications   pregabalin  (LYRICA ) 25 MG capsule   DULoxetine  (CYMBALTA ) 20 MG capsule   Depression, major, single episode, moderate (HCC)   Ms. Pirro is exhibiting depressive symptoms, likely triggered by her current health issues. I will start her on duloxetine , which may also help with her neuropathic pain. I will refer her for counseling.      Relevant Medications   DULoxetine  (CYMBALTA ) 20 MG capsule    Other Relevant Orders   Ambulatory referral to Psychology   Other Visit Diagnoses       Right arm pain       Although this may also reflect her MS, there is some shoulder joint popping. I will refer her to orthopedics for an evaluation.   Relevant Orders   Ambulatory referral to Orthopedics       Return in about 6 weeks (around 08/29/2023) for Reassessment.   Garnette CHRISTELLA Simpler, MD

## 2023-07-18 NOTE — Assessment & Plan Note (Signed)
 Likely secondary to her MS. I will start her on pregabalin to see if we can improve her pain management. I will also start her on duloxetine.

## 2023-07-18 NOTE — Assessment & Plan Note (Signed)
 Jamie Watson is having progressive issues with balance, arm weakness, and allodynia. She is working with Dr. Epimenio Foot (neurology) regarding treatment with ocrelizumab Erick Blinks).

## 2023-07-18 NOTE — Assessment & Plan Note (Signed)
 Ms. Bergthold is exhibiting depressive symptoms, likely triggered by her current health issues. I will start her on duloxetine, which may also help with her neuropathic pain. I will refer her for counseling.

## 2023-07-21 ENCOUNTER — Telehealth: Payer: Self-pay | Admitting: Family Medicine

## 2023-07-21 NOTE — Telephone Encounter (Signed)
 Copied from CRM [id number]. Topic: General - Other >> Jul 21, 2023  9:42 AM Corin V wrote: Reason for CRM: Patient is having FMLA paperwork faxed over from [employer]. She wanted to give a heads up that this is coming over. Please include any new findings, conditions, or problems on the paper. Her case worker is also not understanding why patient is needing FMLA based on her diagnosis. Please add additional information on why accommodations/leave are needed for [employer] to better understand.

## 2023-07-21 NOTE — Telephone Encounter (Signed)
 Lft VM that FMLA form has been faxed to [employer] and want to know if she wants a copy mailed to her home address? Dm/cma

## 2023-07-21 NOTE — Telephone Encounter (Signed)
 Form placed on providers desk to fill out. Dm/cma

## 2023-07-22 NOTE — Telephone Encounter (Signed)
 Spoke to patient and will mail copy to home address. Dm/cma

## 2023-07-24 ENCOUNTER — Other Ambulatory Visit (INDEPENDENT_AMBULATORY_CARE_PROVIDER_SITE_OTHER): Payer: Commercial Managed Care - PPO

## 2023-07-24 ENCOUNTER — Encounter: Payer: Self-pay | Admitting: Orthopaedic Surgery

## 2023-07-24 ENCOUNTER — Ambulatory Visit (INDEPENDENT_AMBULATORY_CARE_PROVIDER_SITE_OTHER): Payer: Commercial Managed Care - PPO | Admitting: Orthopaedic Surgery

## 2023-07-24 DIAGNOSIS — M25521 Pain in right elbow: Secondary | ICD-10-CM

## 2023-07-24 DIAGNOSIS — M25522 Pain in left elbow: Secondary | ICD-10-CM | POA: Diagnosis not present

## 2023-07-24 NOTE — Progress Notes (Signed)
Office Visit Note   Patient: Jamie Watson           Date of Birth: 1970-10-10           MRN: [id number] Visit Date: 07/24/2023              Requested by: Loyola Mast, MD [address] [place],  [place] [address] PCP: Loyola Mast, MD   Assessment & Plan: Visit Diagnoses:  1. Pain in right elbow     Plan: Jamie Watson is a 53 year old female with right elbow pain impression is manifestation of multiple sclerosis.  Low suspicion for a structural orthopedic problem related to the elbow.  Sounds like her shoulders are also involved.  She did get x-rays of the shoulders today and they were normal.  Follow-Up Instructions: No follow-ups on file.   Orders:  Orders Placed This Encounter  Procedures   XR Shoulder Left   XR Shoulder Right   XR Elbow 2 Views Right   No orders of the defined types were placed in this encounter.     Procedures: No procedures performed   Clinical Data: No additional findings.   Subjective: Chief Complaint  Patient presents with   Right Shoulder - Pain   Left Shoulder - Pain    HPI Jamie Watson is a 53 year old female here for evaluation of right elbow pain for 2 months.  This wakes her up.  She was recently diagnosed with MS.  She has pain throughout the elbow at rest and during activity.  Denies any radicular symptoms. Review of Systems  Constitutional: Negative.   HENT: Negative.    Eyes: Negative.   Respiratory: Negative.    Cardiovascular: Negative.   Endocrine: Negative.   Musculoskeletal:  Positive for arthralgias.  Neurological: Negative.   Hematological: Negative.   Psychiatric/Behavioral: Negative.    All other systems reviewed and are negative.    Objective: Vital Signs: There were no vitals taken for this visit.  Physical Exam Vitals and nursing note reviewed.  Constitutional:      Appearance: She is well-developed.  HENT:     Head: Atraumatic.     Nose: Nose normal.  Eyes:     Extraocular Movements:  Extraocular movements intact.  Cardiovascular:     Pulses: Normal pulses.  Pulmonary:     Effort: Pulmonary effort is normal.  Abdominal:     Palpations: Abdomen is soft.  Musculoskeletal:     Cervical back: Neck supple.  Skin:    General: Skin is warm.     Capillary Refill: Capillary refill takes less than 2 seconds.  Neurological:     Mental Status: She is alert. Mental status is at baseline.  Psychiatric:        Behavior: Behavior normal.        Thought Content: Thought content normal.        Judgment: Judgment normal.     Ortho Exam Examination of the right elbow shows normal passive and active range of motion with pain.  She has global tenderness throughout the elbow.  Motor or sensory function intact. Specialty Comments:  No specialty comments available.  Imaging: XR Shoulder Left Result Date: 07/24/2023 X-rays of the left shoulder show no acute or structural abnormalities   XR Shoulder Right Result Date: 07/24/2023 3 view xrays show no acute or structural abnormalities  XR Elbow 2 Views Right Result Date: 07/24/2023 No acute or structural abnormalities.    PMFS History: Patient Active Problem List   Diagnosis  Date Noted   Depression, major, single episode, moderate (HCC) 07/18/2023   Allodynia 07/18/2023   Vertigo 04/17/2023   High risk medication use 04/08/2023   Vitamin D deficiency 04/08/2023   Weakness 03/27/2023   MS (multiple sclerosis) (HCC) 03/27/2023   Hypokalemia 03/27/2023   Obesity (BMI 30-39.9) 03/27/2023   Borderline hyperlipidemia 01/31/2022   Prediabetes 01/31/2022   Essential hypertension 01/28/2022   Marijuana use, continuous 01/28/2022   Past Medical History:  Diagnosis Date   Acute pain of left knee 04/19/2019   Chronic headaches    Fatigue    Hypertension    Irregular heart beat    Menopause    Migraine    Multiple sclerosis (HCC)    Right arm pain 04/19/2019   Seasonal allergies    Snoring    Weight gain     Family  History  Problem Relation Age of Onset   Heart disease Mother    Atrial fibrillation Mother    Cancer Father        Brain   Diabetes Father    Stroke Maternal Aunt    Cancer Maternal Aunt        Stomach   Diabetes Maternal Uncle    Diabetes Maternal Uncle    Diabetes Maternal Uncle    Diabetes Maternal Grandmother    Stroke Maternal Grandmother    Heart disease Maternal Grandfather     Past Surgical History:  Procedure Laterality Date   TONSILLECTOMY AND ADENOIDECTOMY     Social History   Occupational History   Not on file  Tobacco Use   Smoking status: Former    Current packs/day: 0.00    Types: Cigarettes    Quit date: 2012    Years since quitting: 13.0   Smokeless tobacco: Never  Vaping Use   Vaping status: Never Used  Substance and Sexual Activity   Alcohol use: Yes    Comment: Rarely   Drug use: Yes    Types: Marijuana    Comment: Daily use   Sexual activity: Yes    Birth control/protection: None

## 2023-07-25 ENCOUNTER — Other Ambulatory Visit (HOSPITAL_COMMUNITY): Payer: Self-pay

## 2023-08-13 ENCOUNTER — Ambulatory Visit: Payer: Commercial Managed Care - PPO | Admitting: Neurology

## 2023-08-13 ENCOUNTER — Other Ambulatory Visit (HOSPITAL_COMMUNITY): Payer: Self-pay

## 2023-08-13 ENCOUNTER — Encounter: Payer: Self-pay | Admitting: Neurology

## 2023-08-13 VITALS — BP 126/84 | HR 84 | Ht 65.0 in | Wt 205.0 lb

## 2023-08-13 DIAGNOSIS — G35 Multiple sclerosis: Secondary | ICD-10-CM

## 2023-08-13 DIAGNOSIS — R208 Other disturbances of skin sensation: Secondary | ICD-10-CM

## 2023-08-13 DIAGNOSIS — Z79899 Other long term (current) drug therapy: Secondary | ICD-10-CM

## 2023-08-13 DIAGNOSIS — N3941 Urge incontinence: Secondary | ICD-10-CM | POA: Diagnosis not present

## 2023-08-13 DIAGNOSIS — R269 Unspecified abnormalities of gait and mobility: Secondary | ICD-10-CM | POA: Insufficient documentation

## 2023-08-13 MED ORDER — MELOXICAM 7.5 MG PO TABS
7.5000 mg | ORAL_TABLET | Freq: Every day | ORAL | 5 refills | Status: AC | PRN
  Filled 2023-08-13: qty 30, 30d supply, fill #0

## 2023-08-13 MED ORDER — METHYLPREDNISOLONE 4 MG PO TBPK
ORAL_TABLET | ORAL | 0 refills | Status: AC
  Filled 2023-08-13: qty 21, 6d supply, fill #0

## 2023-08-13 MED ORDER — OXYBUTYNIN CHLORIDE ER 10 MG PO TB24
10.0000 mg | ORAL_TABLET | Freq: Every day | ORAL | 11 refills | Status: AC
  Filled 2023-08-13: qty 30, 30d supply, fill #0

## 2023-08-13 NOTE — Progress Notes (Signed)
 [facility]  PATIENT: Jamie Watson DOB: [birth year]  REFERRING DOCTOR OR PCP: Elida Ross, MD; Garnette Loveless, MD SOURCE: Patient, note from recent hospitalization, imaging and lab reports, MRI images personally reviewed.  _________________________________   HISTORICAL  CHIEF COMPLAINT:  Chief Complaint  Patient presents with   Multiple Sclerosis    RM 11 alone Pt is well, reports she is having some pain in both her arms and R leg is having some weakness. She is also having some numbness on L side.     HISTORY OF PRESENT ILLNESS:  Jamie Watson is a 53 y.o. woman with multiple sclerosis.  Update 08/13/2023 She started OCrevus  in November ad tolerated the infusionw well.  Next infusion May 2025.  No new neurologic symptoms.  She is reporting bilateral arm pain, R>L.  On the right pain is near the elbow.   On the left, pain is more tingling dysesthesia.   Her riht knee buckles at times, especially down stairs, due to pain.    She has had some knee pain off/on in past since an injury 3-4 years ago - but seems worse recently.     She saw Dr. Jerri of orthopedics who also did x-rays of her right elbow and shoulder.  No etiology of her pain was discovered  Currently, balance is still a little off and she use the bannister on stairs.   Leg strength improved.  Her left arm numbness has changed to a mild dysesthesia with allodynia.    Leg sensation is altere on the left as well.  The dysestheias feel like ice, sometimes uncomfortable.     Grip and writing improved.      She notes urinary urgency and some incontinence, ac ouple times a week now  She notes reduced left vision the past several years, even before any other neurologic symptom.  She notes fatigue the past few months, better though after the steroid.   She has had some anxiety.    She has insomnia and has a lot of thoughts running through her head.     MS History She is a 53 year old woman with hypertension  and chronic headaches who presented to the emergency room 03/26/2023 with left hand numbness and weakness.  She also had right-sided clumsiness. She went to the [facility] and CT was negative and then went to the [facility] ED.    Imaging studies showed T2 hyperintense foci in the cerebral hemispheres and in the spinal cord consistent with multiple sclerosis.  A focus in the posterior spinal cord at C3-C4 had subtle enhancement, slightly more to the right.  She was admitted and received 5 days of IV Solu-Medrol .     She felt symptoms improved  Also of notes 5-6 weeks earlier, she had the onset of vertigo without positional element.  This was constant a couple weeks and has been improving though not resolved.   She also has had some spasms in her legs, right arm and back. Earlier in 2024 she had the onset of urinary incontinence She has a couple second cousins with MS but no close relative  Imaging: MRI of the brain 03/27/2023 showed scattered T2/FLAIR hyperintense foci in the periventricular, juxtacortical and deep white matter of the cerebral hemispheres.  Normal enhancement pattern.  MRI of the cervical spine 03/27/2023 showed T2 hyperintense foci within the spinal cord.   Posteriorly adjacent to C2-C3, posteriorly, adjacent to C3-C4, and, on sagittal images adjacent to T2.   The focus at  C3-C4 enhances after contrast consistent with acute demyelination  Laboratory: September 2024: Vitamin D  was very low (less than 7) , pregnancy at test, HIV antibody, ANA, neuromyelitis optica IgG were normal or negative. REVIEW OF SYSTEMS: Constitutional: No fevers, chills, sweats, or change in appetite Eyes: No visual changes, double vision, eye pain Ear, nose and throat: No hearing loss, ear pain, nasal congestion, sore throat Cardiovascular: No chest pain, palpitations Respiratory:  No shortness of breath at rest or with exertion.   No wheezes GastrointestinaI: No nausea, vomiting, diarrhea, abdominal pain,  fecal incontinence Genitourinary:  No dysuria, urinary retention or frequency.  No nocturia. Musculoskeletal:  No neck pain, back pain Integumentary: No rash, pruritus, skin lesions Neurological: as above Psychiatric: No depression at this time.  No anxiety Endocrine: No palpitations, diaphoresis, change in appetite, change in weigh or increased thirst Hematologic/Lymphatic:  No anemia, purpura, petechiae. Allergic/Immunologic: No itchy/runny eyes, nasal congestion, recent allergic reactions, rashes  ALLERGIES: No Known Allergies  HOME MEDICATIONS:  Current Outpatient Medications:    amLODipine  (NORVASC ) 5 MG tablet, Take 1 tablet (5 mg total) by mouth at bedtime., Disp: 90 tablet, Rfl: 3   DULoxetine  (CYMBALTA ) 20 MG capsule, Take 1 capsule (20 mg total) by mouth 2 (two) times daily., Disp: 60 capsule, Rfl: 3   meclizine  (ANTIVERT ) 25 MG tablet, Take 1 tablet (25 mg total) by mouth 3 (three) times daily as needed for dizziness., Disp: 30 tablet, Rfl: 0   meloxicam  (MOBIC ) 7.5 MG tablet, Take 1 tablet (7.5 mg total) by mouth daily as needed., Disp: 30 tablet, Rfl: 5   methylPREDNISolone  (MEDROL  DOSEPAK) 4 MG TBPK tablet, Take 6 tablets (24 mg total) by mouth daily for 1 day, THEN 5 tablets (20 mg total) daily for 1 day, THEN 4 tablets (16 mg total) daily for 1 day, THEN 3 tablets (12 mg total) daily for 1 day, THEN 2 tablets (8 mg total) daily for 1 day, THEN 1 tablet (4 mg total) daily for 1 day., Disp: 21 tablet, Rfl: 0   ocrelizumab  300 mg in sodium chloride  0.9 % 250 mL, Inject 300 mg into the vein once., Disp: , Rfl:    oxybutynin  (DITROPAN  XL) 10 MG 24 hr tablet, Take 1 tablet (10 mg total) by mouth at bedtime., Disp: 30 tablet, Rfl: 11   pregabalin  (LYRICA ) 25 MG capsule, Take 1 capsule (25 mg total) by mouth 2 (two) times daily., Disp: 60 capsule, Rfl: 3   valsartan -hydrochlorothiazide  (DIOVAN -HCT) 160-12.5 MG tablet, Take 1 tablet by mouth daily., Disp: 90 tablet, Rfl: 3  PAST  MEDICAL HISTORY: Past Medical History:  Diagnosis Date   Acute pain of left knee 04/19/2019   Chronic headaches    Fatigue    Hypertension    Irregular heart beat    Menopause    Migraine    Multiple sclerosis (HCC)    Right arm pain 04/19/2019   Seasonal allergies    Snoring    Weight gain     PAST SURGICAL HISTORY: Past Surgical History:  Procedure Laterality Date   TONSILLECTOMY AND ADENOIDECTOMY      FAMILY HISTORY: Family History  Problem Relation Age of Onset   Heart disease Mother    Atrial fibrillation Mother    Cancer Father        Brain   Diabetes Father    Stroke Maternal Aunt    Cancer Maternal Aunt        Stomach   Diabetes Maternal Uncle    Diabetes  Maternal Uncle    Diabetes Maternal Uncle    Diabetes Maternal Grandmother    Stroke Maternal Grandmother    Heart disease Maternal Grandfather     SOCIAL HISTORY: Social History   Socioeconomic History   Marital status: Legally Separated    Spouse name: Not on file   Number of children: 0   Years of education: Not on file   Highest education level: Bachelor's degree (e.g., BA, AB, BS)  Occupational History   Not on file  Tobacco Use   Smoking status: Former    Current packs/day: 0.00    Types: Cigarettes    Quit date: 2012    Years since quitting: 13.1   Smokeless tobacco: Never  Vaping Use   Vaping status: Never Used  Substance and Sexual Activity   Alcohol use: Yes    Comment: Rarely   Drug use: Yes    Types: Marijuana    Comment: Daily use   Sexual activity: Yes    Birth control/protection: None  Other Topics Concern   Not on file  Social History Narrative   Right Handed   1 Cup of Coffee per Day   Social Drivers of Health   Financial Resource Strain: Not on file  Food Insecurity: Not on file  Transportation Needs: Not on file  Physical Activity: Not on file  Stress: Not on file  Social Connections: Not on file  Intimate Partner Violence: Not on file       PHYSICAL  EXAM  Vitals:   08/13/23 1511  BP: 126/84  Pulse: 84  Weight: 205 lb (93 kg)  Height: 5' 5 (1.651 m)    Body mass index is 34.11 kg/m.   General: The patient is well-developed and well-nourished and in no acute distress  HEENT:  Head is [place]/AT.  Sclera are anicteric.  Funduscopic exam shows normal optic discs and retinal vessels.  Neck: No carotid bruits are noted.  The neck is nontender.  Cardiovascular: The heart has a regular rate and rhythm with a normal S1 and S2. There were no murmurs, gallops or rubs.    Skin: Extremities are without rash or  edema.  Musculoskeletal:  Back is nontender   she has tenderness at the right elbow to palpation.  Additionally, there is pain upon supination  Neurologic Exam  Mental status: The patient is alert and oriented x 3 at the time of the examination. The patient has apparent normal recent and remote memory, with an apparently normal attention span and concentration ability.   Speech is normal.  Cranial nerves: Extraocular movements are full. Pupils are equal, round, and reactive to light and accomodation. Reduced color saturation OS.  Facial symmetry is present. There is good facial sensation to soft touch bilaterally.Facial strength is normal.  Trapezius and sternocleidomastoid strength is normal. No dysarthria is noted.  The tongue is midline, and the patient has symmetric elevation of the soft palate. No obvious hearing deficits are noted.  Motor:  Muscle bulk is normal.   Tone is normal. Strength is  5 / 5 in all 4 extremities.   Sensory: Sensory testing is intact to vibration sensation in all 4 extremities.  She has reduced sensation to touch and temperature and allodynia in the left arm and left leg.  Milder asymmetry to vibration sensation. Coordination: Cerebellar testing reveals good finger-nose-finger and mildly reduced heel-to-shin bilaterally.  Gait and station: Station is normal.   Gait is slightly wide.   No foot drop.   Tandem  is moderately wide.  Romberg is negative.   Reflexes: Deep tendon reflexes are symmetric and normal in the arms and mildly increased (3) in legs. [name]      DIAGNOSTIC DATA (LABS, IMAGING, TESTING) - I reviewed patient records, labs, notes, testing and imaging myself where available.  Lab Results  Component Value Date   WBC 5.9 04/02/2023   HGB 15.0 04/02/2023   HCT 45.3 04/02/2023   MCV 91.3 04/02/2023   PLT 289.0 04/02/2023      Component Value Date/Time   NA 139 04/02/2023 1130   K 3.1 (L) 04/02/2023 1130   CL 100 04/02/2023 1130   CO2 30 04/02/2023 1130   GLUCOSE 104 (H) 04/02/2023 1130   BUN 18 04/02/2023 1130   CREATININE 0.87 04/02/2023 1130   CALCIUM 9.2 04/02/2023 1130   PROT 6.6 04/02/2023 1130   ALBUMIN 4.2 04/02/2023 1130   AST 13 04/02/2023 1130   ALT 33 04/02/2023 1130   ALKPHOS 58 04/02/2023 1130   BILITOT 1.0 04/02/2023 1130   GFRNONAA >60 03/30/2023 0857   Lab Results  Component Value Date   CHOL 219 (H) 01/31/2022   HDL 70.50 01/31/2022   LDLCALC 129 (H) 01/31/2022   TRIG 100.0 01/31/2022   CHOLHDL 3 01/31/2022   Lab Results  Component Value Date   HGBA1C 5.8 (H) 03/29/2023   Lab Results  Component Value Date   VITAMINB12 373 04/02/2023   Lab Results  Component Value Date   TSH 2.09 04/02/2023       ASSESSMENT AND PLAN  MS (multiple sclerosis) (HCC)  High risk medication use  Gait disturbance  Dysesthesia  Urge incontinence   Continue Ocrevus .  Around the time of her next visit we will check labs including IgG/IgM, CD20.  She will also need an MRI around that time to determine if there is any subclinical progression.  r. Medrol  dose pak followed by NSAID for right elbow musculoskeletal pain (xray was normal so exact etiology not clear - unlikely to be MS) She has dysesthesia in left arm and leg likely from MS.  She can start the Lyrica  that was prescribed and we can increase if tolerated (she might hold off to see if pain  improves on Cymbalta ).   Ok to start Cymbalta  for mood 5.   Oxybutynin  XL for bladder.    Rtc 6 months   Malaney Mcbean A. Vear, MD, [facility] 08/13/2023, 4:18 PM Certified in Neurology, Clinical Neurophysiology, Sleep Medicine and Neuroimaging  [facility] [address], [address] [place], [place] [address] [phone]

## 2023-08-14 ENCOUNTER — Other Ambulatory Visit: Payer: Self-pay

## 2023-08-14 ENCOUNTER — Other Ambulatory Visit (HOSPITAL_COMMUNITY): Payer: Self-pay

## 2023-08-18 ENCOUNTER — Other Ambulatory Visit (HOSPITAL_COMMUNITY): Payer: Self-pay

## 2023-09-01 ENCOUNTER — Ambulatory Visit: Payer: Commercial Managed Care - PPO | Admitting: Family Medicine

## 2023-09-03 ENCOUNTER — Ambulatory Visit: Payer: Commercial Managed Care - PPO | Admitting: Family Medicine

## 2023-09-03 ENCOUNTER — Encounter: Payer: Self-pay | Admitting: Family Medicine

## 2023-09-03 VITALS — BP 127/86 | HR 98 | Temp 98.3°F | Resp 18 | Wt 202.4 lb

## 2023-09-03 DIAGNOSIS — I10 Essential (primary) hypertension: Secondary | ICD-10-CM | POA: Diagnosis not present

## 2023-09-03 DIAGNOSIS — G35 Multiple sclerosis: Secondary | ICD-10-CM

## 2023-09-03 DIAGNOSIS — Z1211 Encounter for screening for malignant neoplasm of colon: Secondary | ICD-10-CM

## 2023-09-03 DIAGNOSIS — Z23 Encounter for immunization: Secondary | ICD-10-CM | POA: Diagnosis not present

## 2023-09-03 NOTE — Progress Notes (Signed)
 [facility] [address] [place] [address] Dept: [phone] Dept Fax: [phone]  Chronic Care Office Visit  Subjective:    Patient ID: Jamie Watson, female    DOB: [birth year], 53 y.o..   MRN: [id number]  Chief Complaint  Patient presents with   Medical Managment of Chronic Issues     6 week follow up. Pt stated that vertigo has improved with ongoing arm pain with numbness; Pt also have FMLA paperwork that needs to be completed; colonoscopy and vaccines are due, mammogram report requested.    History of Present Illness:  Patient is in today for reassessment of chronic medical issues.  [name] was diagnosed in Sept. 2024 with multiple sclerosis. She was treated initially with IV steroids.  She is now seeing Dr. Epimenio Foot. She is managed on ocrelizumab (Ocrevus) infusion. [name] continues to have some pain in both of her arms. The left arm is primarily a sensation of cold or burning, esp, with even light touch. The right arm has been more of weakness and elbow pain. She has seen orthopedics and did have another course of steroids, which helped some with the right arm pain.  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 She currently works a 10-hr. shift, 7 days in a row, then has 7 days off. She continues to struggle with the long stretches of work. Her supervisor has not been very open to working with accommodations for her.   [name] has a history of hypertension. She is currently managed on valsartan-HCTZ 160-12.5 mg daily and amlodipine 5 mg daily.   Past Medical History: Patient Active Problem List   Diagnosis Date Noted   Gait disturbance 08/13/2023   Depression, major, single episode, moderate (HCC) 07/18/2023   Allodynia 07/18/2023   Vertigo 04/17/2023   High risk medication use 04/08/2023   Vitamin D deficiency 04/08/2023   Weakness 03/27/2023   MS (multiple sclerosis) (HCC) 03/27/2023   Hypokalemia 03/27/2023    Obesity (BMI 30-39.9) 03/27/2023   Borderline hyperlipidemia 01/31/2022   Prediabetes 01/31/2022   Essential hypertension 01/28/2022   Marijuana use, continuous 01/28/2022   Past Surgical History:  Procedure Laterality Date   TONSILLECTOMY AND ADENOIDECTOMY     Family History  Problem Relation Age of Onset   Heart disease Mother    Atrial fibrillation Mother    Cancer Father        Brain   Diabetes Father    Stroke Maternal Aunt    Cancer Maternal Aunt        Stomach   Diabetes Maternal Uncle    Diabetes Maternal Uncle    Diabetes Maternal Uncle    Diabetes Maternal Grandmother    Stroke Maternal Grandmother    Heart disease Maternal Grandfather    Outpatient Medications Prior to Visit  Medication Sig Dispense Refill   amLODipine (NORVASC) 5 MG tablet Take 1 tablet (5 mg total) by mouth at bedtime. 90 tablet 3   DULoxetine (CYMBALTA) 20 MG capsule Take 1 capsule (20 mg total) by mouth 2 (two) times daily. 60 capsule 3   meclizine (ANTIVERT) 25 MG tablet Take 1 tablet (25 mg total) by mouth 3 (three) times daily as needed for dizziness. 30 tablet 0   meloxicam (MOBIC) 7.5 MG tablet Take 1 tablet (7.5 mg total) by mouth daily as needed. 30 tablet 5   ocrelizumab 300 mg in sodium chloride 0.9 % 250 mL Inject 300 mg into the vein once.  oxybutynin (DITROPAN XL) 10 MG 24 hr tablet Take 1 tablet (10 mg total) by mouth at bedtime. 30 tablet 11   pregabalin (LYRICA) 25 MG capsule Take 1 capsule (25 mg total) by mouth 2 (two) times daily. 60 capsule 3   valsartan-hydrochlorothiazide (DIOVAN-HCT) 160-12.5 MG tablet Take 1 tablet by mouth daily. 90 tablet 3   No facility-administered medications prior to visit.   No Known Allergies Objective:   Today's Vitals   09/03/23 1036  BP: 127/86  Pulse: 98  Resp: 18  Temp: 98.3 F (36.8 C)  TempSrc: Temporal  SpO2: 100%  Weight: 202 lb 6.4 oz (91.8 kg)  PainSc: 4   PainLoc: Arm   Body mass index is 33.68 kg/m.   General:  Well developed, well nourished. No acute distress. Psych: Alert and oriented. Normal mood and affect.  Health Maintenance Due  Topic Date Due   Colonoscopy  09/22/2017   MAMMOGRAM  05/16/2023   Zoster Vaccines- Shingrix (2 of 2) 07/08/2023     Assessment & Plan:   Problem List Items Addressed This Visit       Cardiovascular and Mediastinum   Essential hypertension - Primary   Blood pressure is in good control. Continue amlodipine 5 mg daily and valsartan-HCTZ (Diovan-HCT) 160-12.5 mg daily.         Nervous and Auditory   MS (multiple sclerosis) (HCC)   Ms. Sendejo is having progressive issues with balance, arm weakness, and allodynia. She is working with Dr. Epimenio Foot (neurology) regarding treatment with ocrelizumab Erick Blinks).      Other Visit Diagnoses       Need for Tdap vaccination       Relevant Orders   Tdap vaccine greater than or equal to 7yo IM (Completed)     Screening for colon cancer       Relevant Orders   Cologuard       Return in about 3 months (around 12/01/2023) for Reassessment.   Loyola Mast, MD

## 2023-09-03 NOTE — Assessment & Plan Note (Signed)
 Jamie Watson is having progressive issues with balance, arm weakness, and allodynia. She is working with Dr. Epimenio Foot (neurology) regarding treatment with ocrelizumab Erick Blinks).

## 2023-09-03 NOTE — Assessment & Plan Note (Signed)
 Blood pressure is in good control. Continue amlodipine 5 mg daily and valsartan-HCTZ (Diovan-HCT) 160-12.5 mg daily.

## 2023-09-04 ENCOUNTER — Encounter: Payer: Self-pay | Admitting: Family Medicine

## 2023-09-04 ENCOUNTER — Other Ambulatory Visit: Payer: Self-pay

## 2023-09-09 ENCOUNTER — Ambulatory Visit: Payer: Commercial Managed Care - PPO | Admitting: Licensed Clinical Social Worker

## 2023-09-12 ENCOUNTER — Telehealth: Payer: Self-pay | Admitting: Family Medicine

## 2023-09-12 NOTE — Telephone Encounter (Signed)
 03/07/2023 no show 09/01/2023 same day cancel  Final warning sent via mail and mychart

## 2023-09-16 ENCOUNTER — Other Ambulatory Visit (HOSPITAL_COMMUNITY): Payer: Self-pay

## 2023-10-02 DIAGNOSIS — Z1211 Encounter for screening for malignant neoplasm of colon: Secondary | ICD-10-CM | POA: Diagnosis not present

## 2023-10-05 LAB — COLOGUARD: COLOGUARD: NEGATIVE

## 2023-10-14 ENCOUNTER — Telehealth: Payer: Self-pay

## 2023-10-14 NOTE — Telephone Encounter (Signed)
 Left VM to rtn call to ask about FMLA form we received.  Dm/cma

## 2023-10-20 NOTE — Telephone Encounter (Signed)
 Patient called back and this form is not needed.  Insurance company tried telling her they didn't get the paperwork form Feburary.  They did get it..  disregard form that was sent tot us . Dm/cma

## 2023-10-31 ENCOUNTER — Encounter: Payer: Self-pay | Admitting: Neurology

## 2023-10-31 DIAGNOSIS — G35 Multiple sclerosis: Secondary | ICD-10-CM

## 2023-11-05 ENCOUNTER — Ambulatory Visit

## 2023-11-06 ENCOUNTER — Ambulatory Visit (INDEPENDENT_AMBULATORY_CARE_PROVIDER_SITE_OTHER)

## 2023-11-06 VITALS — Temp 97.4°F

## 2023-11-06 DIAGNOSIS — Z23 Encounter for immunization: Secondary | ICD-10-CM | POA: Diagnosis not present

## 2023-11-06 NOTE — Progress Notes (Signed)
 Per orders of Dr. Tilmon Font, injection of Shingles  given by Stefani Edin in right deltoid. Patient tolerated injection well.

## 2023-11-13 ENCOUNTER — Other Ambulatory Visit (HOSPITAL_COMMUNITY): Payer: Self-pay

## 2023-11-13 MED ORDER — VENLAFAXINE HCL ER 75 MG PO CP24
75.0000 mg | ORAL_CAPSULE | Freq: Every day | ORAL | 5 refills | Status: DC
  Filled 2023-11-13: qty 30, 30d supply, fill #0

## 2023-11-13 MED ORDER — PREDNISONE 50 MG PO TABS
1000.0000 mg | ORAL_TABLET | Freq: Every day | ORAL | 0 refills | Status: AC
Start: 2023-11-13 — End: 2023-11-18
  Filled 2023-11-13: qty 80, 4d supply, fill #0

## 2023-11-13 NOTE — Telephone Encounter (Signed)
 Spoke w/ Dr. Godwin Lat. He wants to do prednisone  50mg , 20 tabs (1000mg ) po x4 days. For depression, recommending Effexor 75mg  po every day. I called pt and relayed recommendation. She is agreeable. Rx's sent to [facility] pharmacy.   She confirmed she never started duloxetine  on med list. She was concerned about potential SE. This was d/c'd.   She will think about what is needed for work schedule and let us  know for work accomodation form.

## 2023-11-13 NOTE — Telephone Encounter (Signed)
 Called pt. Sx started around 11/08/23 but became more severe on 11/09/23. No injuries prior to sx starting. No illness/infection currently. Dizziness has been constant. Double vision has cleared up. Experiencing confusion. Having trouble remembering dates. Dropping items more often. Took meclizine  other night for vertigo d/t balance issues she was having. Helped some. No hx of diabetes.  Next Ocrevus  infusion: 11/25/23. Last seen 08/13/23.  Also having concerns about weight loss. Has noticed clothing fitting a lot looser. Has no appetite.   Also more depressed.  Has tried Effexor many years ago and it was beneficial. Feels work schedule worsening depression. Works 7 days straight, third shift, 10 hr days. Having panic attacks at work. Wanting to know if Dr. Godwin Lat can recommend an alternate work schedule that would be more beneficial in helping her manage neurological condition. Also wants to know if he will prescribe medication for depression.  I advised her to speak with HR department about work accomodation form and have them fax to [phone] attn: Dr. Godwin Lat.   Aware I will speak with MD and call back.

## 2023-11-14 ENCOUNTER — Other Ambulatory Visit (HOSPITAL_COMMUNITY): Payer: Self-pay

## 2023-11-17 DIAGNOSIS — H524 Presbyopia: Secondary | ICD-10-CM | POA: Diagnosis not present

## 2023-11-17 DIAGNOSIS — H35033 Hypertensive retinopathy, bilateral: Secondary | ICD-10-CM | POA: Diagnosis not present

## 2023-11-21 ENCOUNTER — Encounter: Payer: Self-pay | Admitting: Family Medicine

## 2023-11-21 NOTE — Telephone Encounter (Signed)
 Form being worked on and will let patient know when completed. Dm/cma

## 2023-11-25 DIAGNOSIS — G35 Multiple sclerosis: Secondary | ICD-10-CM | POA: Diagnosis not present

## 2023-11-27 ENCOUNTER — Telehealth: Payer: Self-pay | Admitting: Family Medicine

## 2023-11-27 ENCOUNTER — Other Ambulatory Visit: Payer: Self-pay

## 2023-11-27 NOTE — Telephone Encounter (Signed)
 Called patient and advised that we would need a new FMLA form to fill out.  She will call them and give them our fax number.  Dm/cma

## 2023-11-27 NOTE — Telephone Encounter (Signed)
 Please call pt she has questions about the fmla forms.

## 2023-12-02 ENCOUNTER — Encounter: Payer: Self-pay | Admitting: Family Medicine

## 2023-12-02 ENCOUNTER — Other Ambulatory Visit (HOSPITAL_COMMUNITY): Payer: Self-pay

## 2023-12-02 ENCOUNTER — Ambulatory Visit: Payer: Commercial Managed Care - PPO | Admitting: Family Medicine

## 2023-12-02 VITALS — BP 118/76 | HR 90 | Temp 97.2°F | Ht 65.0 in | Wt 201.2 lb

## 2023-12-02 DIAGNOSIS — I10 Essential (primary) hypertension: Secondary | ICD-10-CM

## 2023-12-02 DIAGNOSIS — F5101 Primary insomnia: Secondary | ICD-10-CM | POA: Diagnosis not present

## 2023-12-02 DIAGNOSIS — G35 Multiple sclerosis: Secondary | ICD-10-CM | POA: Diagnosis not present

## 2023-12-02 DIAGNOSIS — L301 Dyshidrosis [pompholyx]: Secondary | ICD-10-CM | POA: Diagnosis not present

## 2023-12-02 MED ORDER — TRAZODONE HCL 50 MG PO TABS
25.0000 mg | ORAL_TABLET | Freq: Every evening | ORAL | 3 refills | Status: AC | PRN
Start: 2023-12-02 — End: ?
  Filled 2023-12-02: qty 30, 30d supply, fill #0

## 2023-12-02 NOTE — Assessment & Plan Note (Signed)
 Jamie Watson is having intermittent issues with balance, arm weakness, and allodynia. She is working with Dr. Godwin Lat (neurology) regarding treatment with ocrelizumab  (Ocrelvus). I recommend she request new FMLA forms so we can update the work schedule to reflect up to 4 intermittent days per month off to accommodate her MS flares.

## 2023-12-02 NOTE — Assessment & Plan Note (Signed)
 Recommend daily liberal use of moisturizing lotion. For flares, she should use hydrocortisone cream bid.

## 2023-12-02 NOTE — Progress Notes (Signed)
 [facility] [address] [place] [address] Dept: [phone] Dept Fax: [phone]  Chronic Care Office Visit  Subjective:    Patient ID: Jamie Watson, female    DOB: 11-18-1970, 53 y.o..   MRN: [id number]  Chief Complaint  Patient presents with   Hypertension    3 month f/u.  ? FMLA wording.    History of Present Illness:  Patient is in today for reassessment of chronic medical issues.  Ms. Probert was diagnosed in Sept. 2024 with multiple sclerosis. She was treated initially with IV steroids.  She is now seeing Dr. Godwin Lat. She is managed on ocrelizumab  (Ocrevus ) infusions. Ms. Fitzsimmons continues to have some pain in both of her arms. She notes she had a flare 2 weeks ago, which caused her some difficulty for about 2 weeks. She has been informed by he employer that all employees are going to be required to work 12-hr. shifts. We had previously requested reasonable accomodation in light of her MS for no more than an 8 hour shift. She has also had some issues with her employer regarding her FMLA. She is allowed a single absence of up to 4 days in a month. They do not interpret this as allowing for 4 days per month used non-consecutively. She also notes she is having some difficulty with sleep at times.    Ms. Diemer has a history of hypertension. She is currently managed on valsartan -HCTZ 160-12.5 mg daily and amlodipine  5 mg daily.   Ms. Decelle notes some peeling rash of her hands. This can feel irritated.  Past Medical History: Patient Active Problem List   Diagnosis Date Noted   Gait disturbance 08/13/2023   Depression, major, single episode, moderate (HCC) 07/18/2023   Allodynia 07/18/2023   Vertigo 04/17/2023   High risk medication use 04/08/2023   Vitamin D  deficiency 04/08/2023   Weakness 03/27/2023   MS (multiple sclerosis) (HCC) 03/27/2023   Hypokalemia 03/27/2023   Obesity (BMI 30-39.9) 03/27/2023   Borderline  hyperlipidemia 01/31/2022   Prediabetes 01/31/2022   Essential hypertension 01/28/2022   Marijuana use, continuous 01/28/2022   Past Surgical History:  Procedure Laterality Date   TONSILLECTOMY AND ADENOIDECTOMY     Family History  Problem Relation Age of Onset   Heart disease Mother    Atrial fibrillation Mother    Cancer Father        Brain   Diabetes Father    Stroke Maternal Aunt    Cancer Maternal Aunt        Stomach   Diabetes Maternal Uncle    Diabetes Maternal Uncle    Diabetes Maternal Uncle    Diabetes Maternal Grandmother    Stroke Maternal Grandmother    Heart disease Maternal Grandfather    Outpatient Medications Prior to Visit  Medication Sig Dispense Refill   amLODipine  (NORVASC ) 5 MG tablet Take 1 tablet (5 mg total) by mouth at bedtime. 90 tablet 3   meclizine  (ANTIVERT ) 25 MG tablet Take 1 tablet (25 mg total) by mouth 3 (three) times daily as needed for dizziness. 30 tablet 0   meloxicam  (MOBIC ) 7.5 MG tablet Take 1 tablet (7.5 mg total) by mouth daily as needed. 30 tablet 5   ocrelizumab  300 mg in sodium chloride  0.9 % 250 mL Inject 300 mg into the vein once.     oxybutynin  (DITROPAN  XL) 10 MG 24 hr tablet Take 1 tablet (10 mg total) by mouth at bedtime. 30 tablet 11  valsartan -hydrochlorothiazide  (DIOVAN -HCT) 160-12.5 MG tablet Take 1 tablet by mouth daily. 90 tablet 3   venlafaxine  XR (EFFEXOR  XR) 75 MG 24 hr capsule Take 1 capsule (75 mg total) by mouth daily with breakfast. 30 capsule 5   pregabalin  (LYRICA ) 25 MG capsule Take 1 capsule (25 mg total) by mouth 2 (two) times daily. 60 capsule 3   No facility-administered medications prior to visit.   No Known Allergies Objective:   Today's Vitals   12/02/23 1025  BP: 118/76  Pulse: 90  Temp: (!) 97.2 F (36.2 C)  TempSrc: Temporal  SpO2: 99%  Weight: 201 lb 3.2 oz (91.3 kg)  Height: 5\' 5"  (1.651 m)   Body mass index is 33.48 kg/m.   General: Well developed, well nourished. No acute  distress. Skin: There are patches of redness and superficial scaling on the palms of the hands. Psych: Alert and oriented. Normal mood and affect.  There are no preventive care reminders to display for this patient.    Assessment & Plan:   Problem List Items Addressed This Visit       Cardiovascular and Mediastinum   Essential hypertension   Blood pressure is in good control. Continue amlodipine  5 mg daily and valsartan -HCTZ (Diovan -HCT) 160-12.5 mg daily.         Nervous and Auditory   MS (multiple sclerosis) (HCC) - Primary   Ms. Harwick is having intermittent issues with balance, arm weakness, and allodynia. She is working with Dr. Godwin Lat (neurology) regarding treatment with ocrelizumab  Adah Hollering). I recommend she request new FMLA forms so we can update the work schedule to reflect up to 4 intermittent days per month off to accommodate her MS flares.        Musculoskeletal and Integument   Dyshidrotic eczema   Recommend daily liberal use of moisturizing lotion. For flares, she should use hydrocortisone cream bid.        Other   Primary insomnia   I will try adding trazodone at bedtime.      Relevant Medications   traZODone (DESYREL) 50 MG tablet    Return in about 3 months (around 03/03/2024) for Reassessment.   Graig Lawyer, MD

## 2023-12-02 NOTE — Assessment & Plan Note (Signed)
 Blood pressure is in good control. Continue amlodipine 5 mg daily and valsartan-HCTZ (Diovan-HCT) 160-12.5 mg daily.

## 2023-12-02 NOTE — Assessment & Plan Note (Signed)
 I will try adding trazodone at bedtime.

## 2023-12-12 ENCOUNTER — Other Ambulatory Visit (HOSPITAL_COMMUNITY): Payer: Self-pay

## 2024-02-25 ENCOUNTER — Ambulatory Visit: Payer: Commercial Managed Care - PPO | Admitting: Neurology

## 2024-02-25 ENCOUNTER — Encounter: Payer: Self-pay | Admitting: Neurology

## 2024-02-25 VITALS — BP 105/71 | Ht 66.0 in | Wt 206.0 lb

## 2024-02-25 DIAGNOSIS — N3941 Urge incontinence: Secondary | ICD-10-CM | POA: Diagnosis not present

## 2024-02-25 DIAGNOSIS — R200 Anesthesia of skin: Secondary | ICD-10-CM | POA: Diagnosis not present

## 2024-02-25 DIAGNOSIS — E876 Hypokalemia: Secondary | ICD-10-CM | POA: Diagnosis not present

## 2024-02-25 DIAGNOSIS — G35 Multiple sclerosis: Secondary | ICD-10-CM

## 2024-02-25 DIAGNOSIS — G35A Relapsing-remitting multiple sclerosis: Secondary | ICD-10-CM

## 2024-02-25 DIAGNOSIS — E559 Vitamin D deficiency, unspecified: Secondary | ICD-10-CM | POA: Diagnosis not present

## 2024-02-25 DIAGNOSIS — Z79899 Other long term (current) drug therapy: Secondary | ICD-10-CM

## 2024-02-25 DIAGNOSIS — R269 Unspecified abnormalities of gait and mobility: Secondary | ICD-10-CM

## 2024-02-25 DIAGNOSIS — R208 Other disturbances of skin sensation: Secondary | ICD-10-CM | POA: Diagnosis not present

## 2024-02-25 NOTE — Progress Notes (Addendum)
 [facility]  PATIENT: Jamie Watson DOB: 20-Aug-1970  REFERRING DOCTOR OR PCP: Elida Ross, MD; Garnette Loveless, MD SOURCE: Patient, note from recent hospitalization, imaging and lab reports, MRI images personally reviewed.  _________________________________   HISTORICAL  CHIEF COMPLAINT:  Chief Complaint  Patient presents with   Follow-up    Pt in room 10.alone. Here for MS follow up. DMT: Ocrevus  ,Last infusion date: 11/25/2023 Next infusion date: 05/25/2024. Pt c/o muscle weakness, concerned about vitamin level, would like physical therapy referral. Eye exam was 2 months ago. No falls.      HISTORY OF PRESENT ILLNESS:  Jamie Watson is a 53 y.o. woman with relapsing remitting multiple sclerosis.  Update 02/25/2024 She started Ocrevus  in November 2024 and tolerated the infusions well.  Next infusion November 2025.  No new neurologic symptoms.   She is not reporting a wearing off effect.   She has no systemic side effects with the infusions.  She is reporting bilateral arm pain.  On the right she reports the pain is near the elbow.   On the left, pain is more tingling dysesthesia , arm > leg   She saw Dr. Jerri of orthopedics who also did x-rays of her right elbow and shoulder.  No etiology of her pain was discovered    Currently, balance is still a little off and she use the bannister on stairs.   Leg strength improved.  Her riht knee buckles at times, especially down stairs, due to pain.    She has had some knee pain off/on in past since an injury 3-4 years ago - but seems worse recently.     Her left arm numbness has changed to a mild dysesthesia with allodynia.    Leg sensation is altere on the left as well.  The dysestheias feel like ice, sometimes uncomfortable.     Grip and writing improved.      She notes urinary urgency and some incontinence, ac ouple times a week now  She notes reduced left vision the past several years, even before any other neurologic  symptom.  She notes more fatigue - always better for a week after the infusion but more of a problem th rest of the time.   She has had some anxiety.    She has irritability and mild depression.   She has insomnia and has a lot of thoughts running through her head.  She feels memory and attention are a little worse.  Vit D was very low (<7)   She took 50,000 u weekly x 3 months but none since then  MS History She is a 53 year old woman with hypertension and chronic headaches who presented to the emergency room 03/26/2023 with left hand numbness and weakness.  She also had right-sided clumsiness. She went to the [facility] and CT was negative and then went to the [facility] ED.    Imaging studies showed T2 hyperintense foci in the cerebral hemispheres and in the spinal cord consistent with multiple sclerosis.  A focus in the posterior spinal cord at C3-C4 had subtle enhancement, slightly more to the right.  She was admitted and received 5 days of IV Solu-Medrol .     She felt symptoms improved.   Also of notes 5-6 weeks earlier, she had the onset of vertigo without positional element.  This was constant a couple weeks and has been improving though not resolved.   She also has had some spasms in her legs, right arm and  back. Earlier in 2024 she had the onset of urinary incontinence  She has a couple second cousins with MS but no close relative  Imaging: MRI of the brain 03/27/2023 showed scattered T2/FLAIR hyperintense foci in the periventricular, juxtacortical and deep white matter of the cerebral hemispheres.  Normal enhancement pattern.  MRI of the cervical spine 03/27/2023 showed T2 hyperintense foci within the spinal cord.   Posteriorly adjacent to C2-C3, posteriorly, adjacent to C3-C4, and, on sagittal images adjacent to T2.   The focus at C3-C4 enhances after contrast consistent with acute demyelination  Laboratory: September 2024: Vitamin D  was very low (less than 7) , pregnancy test, HIV  antibody, ANA, neuromyelitis optica IgG were normal or negative. REVIEW OF SYSTEMS: Constitutional: No fevers, chills, sweats, or change in appetite Eyes: No visual changes, double vision, eye pain Ear, nose and throat: No hearing loss, ear pain, nasal congestion, sore throat Cardiovascular: No chest pain, palpitations Respiratory:  No shortness of breath at rest or with exertion.   No wheezes GastrointestinaI: No nausea, vomiting, diarrhea, abdominal pain, fecal incontinence Genitourinary:  No dysuria, urinary retention or frequency.  No nocturia. Musculoskeletal:  No neck pain, back pain Integumentary: No rash, pruritus, skin lesions Neurological: as above Psychiatric: No depression at this time.  No anxiety Endocrine: No palpitations, diaphoresis, change in appetite, change in weigh or increased thirst Hematologic/Lymphatic:  No anemia, purpura, petechiae. Allergic/Immunologic: No itchy/runny eyes, nasal congestion, recent allergic reactions, rashes  ALLERGIES: No Known Allergies  HOME MEDICATIONS:  Current Outpatient Medications:    amLODipine  (NORVASC ) 5 MG tablet, Take 1 tablet (5 mg total) by mouth at bedtime., Disp: 90 tablet, Rfl: 3   meclizine  (ANTIVERT ) 25 MG tablet, Take 1 tablet (25 mg total) by mouth 3 (three) times daily as needed for dizziness., Disp: 30 tablet, Rfl: 0   meloxicam  (MOBIC ) 7.5 MG tablet, Take 1 tablet (7.5 mg total) by mouth daily as needed., Disp: 30 tablet, Rfl: 5   ocrelizumab  300 mg in sodium chloride  0.9 % 250 mL, Inject 300 mg into the vein once., Disp: , Rfl:    oxybutynin  (DITROPAN  XL) 10 MG 24 hr tablet, Take 1 tablet (10 mg total) by mouth at bedtime., Disp: 30 tablet, Rfl: 11   traZODone  (DESYREL ) 50 MG tablet, Take 0.5-1 tablets (25-50 mg total) by mouth at bedtime as needed for sleep., Disp: 30 tablet, Rfl: 3   valsartan -hydrochlorothiazide  (DIOVAN -HCT) 160-12.5 MG tablet, Take 1 tablet by mouth daily., Disp: 90 tablet, Rfl: 3   venlafaxine   XR (EFFEXOR  XR) 75 MG 24 hr capsule, Take 1 capsule (75 mg total) by mouth daily with breakfast., Disp: 30 capsule, Rfl: 5  PAST MEDICAL HISTORY: Past Medical History:  Diagnosis Date   Acute pain of left knee 04/19/2019   Chronic headaches    Fatigue    Hypertension    Irregular heart beat    Menopause    Migraine    Multiple sclerosis (HCC)    Right arm pain 04/19/2019   Seasonal allergies    Snoring    Weight gain     PAST SURGICAL HISTORY: Past Surgical History:  Procedure Laterality Date   TONSILLECTOMY AND ADENOIDECTOMY      FAMILY HISTORY: Family History  Problem Relation Age of Onset   Heart disease Mother    Atrial fibrillation Mother    Cancer Father        Brain   Diabetes Father    Stroke Maternal Aunt    Cancer Maternal Aunt  Stomach   Diabetes Maternal Uncle    Diabetes Maternal Uncle    Diabetes Maternal Uncle    Diabetes Maternal Grandmother    Stroke Maternal Grandmother    Heart disease Maternal Grandfather     SOCIAL HISTORY: Social History   Socioeconomic History   Marital status: Legally Separated    Spouse name: Not on file   Number of children: 0   Years of education: Not on file   Highest education level: Bachelor's degree (e.g., BA, AB, BS)  Occupational History   Not on file  Tobacco Use   Smoking status: Former    Current packs/day: 0.00    Types: Cigarettes    Quit date: 2012    Years since quitting: 13.6   Smokeless tobacco: Never  Vaping Use   Vaping status: Never Used  Substance and Sexual Activity   Alcohol use: Yes    Comment: Rarely   Drug use: Yes    Types: Marijuana    Comment: Daily use   Sexual activity: Yes    Birth control/protection: None  Other Topics Concern   Not on file  Social History Narrative   Right Handed   1 Cup of Coffee per Day   Social Drivers of Health   Financial Resource Strain: Not on file  Food Insecurity: Not on file  Transportation Needs: Not on file  Physical  Activity: Not on file  Stress: Not on file  Social Connections: Not on file  Intimate Partner Violence: Not on file       PHYSICAL EXAM  Vitals:   02/25/24 1450  BP: 105/71  SpO2: 97%  Weight: 206 lb (93.4 kg)  Height: 5' 6 (1.676 m)    Body mass index is 33.25 kg/m.   General: The patient is well-developed and well-nourished and in no acute distress  HEENT:  Head is [place]/AT.  Sclera are anicteric.    Neck: No carotid bruits are noted.  The neck is nontender.  Cardiovascular: The heart has a regular rate and rhythm with a normal S1 and S2. There were no murmurs, gallops or rubs.    Skin: Extremities are without rash or  edema.  Musculoskeletal:  Back is nontender   she has tenderness at the right elbow to palpation.  Additionally, there is pain upon supination  Neurologic Exam  Mental status: The patient is alert and oriented x 3 at the time of the examination. The patient has apparent normal recent and remote memory, with an apparently normal attention span and concentration ability.   Speech is normal.  Cranial nerves: Extraocular movements are full  There is good facial sensation to soft touch bilaterally.Facial strength is normal.  Trapezius and sternocleidomastoid strength is normal. No dysarthria is noted.    No obvious hearing deficits are noted.  Motor:  Muscle bulk is normal.   Tone is normal. Strength is  5 / 5 in all 4 extremities.   Sensory: Sensory testing is intact to vibration sensation in all 4 extremities.  She has hyperpathia to temperature and allodynia in the left arm and left leg.  Milder asymmetry to vibration sensation.  Coordination: Cerebellar testing reveals good finger-nose-finger and mildly reduced heel-to-shin bilaterally.  Gait and station: Station is normal.   Gait is fairly normal though tandem gait is mildly wide.  Romberg is negative.   Reflexes: Deep tendon reflexes are symmetric and normal in the arms and mildly increased (3) in  legs. [name]      DIAGNOSTIC DATA (  LABS, IMAGING, TESTING) - I reviewed patient records, labs, notes, testing and imaging myself where available.  Lab Results  Component Value Date   WBC 5.9 04/02/2023   HGB 15.0 04/02/2023   HCT 45.3 04/02/2023   MCV 91.3 04/02/2023   PLT 289.0 04/02/2023      Component Value Date/Time   NA 139 04/02/2023 1130   K 3.1 (L) 04/02/2023 1130   CL 100 04/02/2023 1130   CO2 30 04/02/2023 1130   GLUCOSE 104 (H) 04/02/2023 1130   BUN 18 04/02/2023 1130   CREATININE 0.87 04/02/2023 1130   CALCIUM 9.2 04/02/2023 1130   PROT 6.6 04/02/2023 1130   ALBUMIN 4.2 04/02/2023 1130   AST 13 04/02/2023 1130   ALT 33 04/02/2023 1130   ALKPHOS 58 04/02/2023 1130   BILITOT 1.0 04/02/2023 1130   GFRNONAA >60 03/30/2023 0857   Lab Results  Component Value Date   CHOL 219 (H) 01/31/2022   HDL 70.50 01/31/2022   LDLCALC 129 (H) 01/31/2022   TRIG 100.0 01/31/2022   CHOLHDL 3 01/31/2022   Lab Results  Component Value Date   HGBA1C 5.8 (H) 03/29/2023   Lab Results  Component Value Date   VITAMINB12 373 04/02/2023   Lab Results  Component Value Date   TSH 2.09 04/02/2023       ASSESSMENT AND PLAN  Multiple sclerosis exacerbation (HCC) - Plan: IgG, IgA, IgM, CBC with Differential/Platelets, Comprehensive metabolic panel with GFR, Magnesium, Ambulatory referral to Physical Therapy, MR BRAIN W WO CONTRAST, MR CERVICAL SPINE W WO CONTRAST  High risk medication use - Plan: IgG, IgA, IgM, CBC with Differential/Platelets, Comprehensive metabolic panel with GFR, Magnesium  Gait disturbance - Plan: Ambulatory referral to Physical Therapy, MR BRAIN W WO CONTRAST, MR CERVICAL SPINE W WO CONTRAST  Dysesthesia - Plan: Ambulatory referral to Physical Therapy  Vitamin D  deficiency - Plan: VITAMIN D  25 Hydroxy (Vit-D Deficiency, Fractures)  Urge incontinence  Numbness  Hypokalemia - Plan: Comprehensive metabolic panel with GFR, Magnesium   Continue Ocrevus  -  check labs.  Check MRI around that time to determine if there is any subclinical progression.  Consider a different DMT if occurring Check Vit D and supplement as needed She has dysesthesia in left arm and leg likely from MS.   Consider Lyrica  She can take the meloxicam  and trazodone  as needed   Continue Oxybutynin  XL for bladder.    6.   Consider a stimulant for ADD/fatigue 7.   She had hypokalemia in the past.  We will recheck.  If potassium is still low this could be due to the HCTZ and she should discuss further with primary care. Rtc 6 months or sooner for new or worsening neurologic symptoms.   Reynard Christoffersen A. Vear, MD, [facility] 02/25/2024, 3:37 PM Certified in Neurology, Clinical Neurophysiology, Sleep Medicine and Neuroimaging  [facility] [address], [address] [place], [place] [address] [phone]

## 2024-02-26 ENCOUNTER — Other Ambulatory Visit (HOSPITAL_COMMUNITY): Payer: Self-pay

## 2024-02-26 ENCOUNTER — Ambulatory Visit: Payer: Self-pay | Admitting: Neurology

## 2024-02-26 LAB — CBC WITH DIFFERENTIAL/PLATELET
Basophils Absolute: 0 x10E3/uL (ref 0.0–0.2)
Basos: 1 %
EOS (ABSOLUTE): 0.1 x10E3/uL (ref 0.0–0.4)
Eos: 3 %
Hematocrit: 42.8 % (ref 34.0–46.6)
Hemoglobin: 13.8 g/dL (ref 11.1–15.9)
Immature Grans (Abs): 0 x10E3/uL (ref 0.0–0.1)
Immature Granulocytes: 0 %
Lymphocytes Absolute: 1.8 x10E3/uL (ref 0.7–3.1)
Lymphs: 43 %
MCH: 30.5 pg (ref 26.6–33.0)
MCHC: 32.2 g/dL (ref 31.5–35.7)
MCV: 95 fL (ref 79–97)
Monocytes Absolute: 0.3 x10E3/uL (ref 0.1–0.9)
Monocytes: 8 %
Neutrophils Absolute: 1.8 x10E3/uL (ref 1.4–7.0)
Neutrophils: 44 %
Platelets: 286 x10E3/uL (ref 150–450)
RBC: 4.53 x10E6/uL (ref 3.77–5.28)
RDW: 13.3 % (ref 11.7–15.4)
WBC: 4.1 x10E3/uL (ref 3.4–10.8)

## 2024-02-26 LAB — IGG, IGA, IGM
IgA/Immunoglobulin A, Serum: 160 mg/dL (ref 87–352)
IgG (Immunoglobin G), Serum: 862 mg/dL (ref 586–1602)
IgM (Immunoglobulin M), Srm: 55 mg/dL (ref 26–217)

## 2024-02-26 LAB — COMPREHENSIVE METABOLIC PANEL WITH GFR
ALT: 25 IU/L (ref 0–32)
AST: 22 IU/L (ref 0–40)
Albumin: 4.4 g/dL (ref 3.8–4.9)
Alkaline Phosphatase: 72 IU/L (ref 44–121)
BUN/Creatinine Ratio: 11 (ref 9–23)
BUN: 9 mg/dL (ref 6–24)
Bilirubin Total: 0.3 mg/dL (ref 0.0–1.2)
CO2: 24 mmol/L (ref 20–29)
Calcium: 9.8 mg/dL (ref 8.7–10.2)
Chloride: 101 mmol/L (ref 96–106)
Creatinine, Ser: 0.85 mg/dL (ref 0.57–1.00)
Globulin, Total: 2.2 g/dL (ref 1.5–4.5)
Glucose: 74 mg/dL (ref 70–99)
Potassium: 4 mmol/L (ref 3.5–5.2)
Sodium: 140 mmol/L (ref 134–144)
Total Protein: 6.6 g/dL (ref 6.0–8.5)
eGFR: 82 mL/min/1.73 (ref >59)

## 2024-02-26 LAB — MAGNESIUM: Magnesium: 1.9 mg/dL (ref 1.6–2.3)

## 2024-02-26 LAB — VITAMIN D 25 HYDROXY (VIT D DEFICIENCY, FRACTURES): Vit D, 25-Hydroxy: 12.4 ng/mL — ABNORMAL LOW (ref 30.0–100.0)

## 2024-02-26 MED ORDER — VITAMIN D (ERGOCALCIFEROL) 1.25 MG (50000 UNIT) PO CAPS
50000.0000 [IU] | ORAL_CAPSULE | ORAL | 1 refills | Status: AC
  Filled 2024-02-26: qty 13, 90d supply, fill #0
  Filled 2024-06-30: qty 13, 90d supply, fill #1

## 2024-03-01 ENCOUNTER — Other Ambulatory Visit (HOSPITAL_COMMUNITY): Payer: Self-pay

## 2024-03-03 ENCOUNTER — Telehealth: Payer: Self-pay | Admitting: Neurology

## 2024-03-03 ENCOUNTER — Other Ambulatory Visit (HOSPITAL_COMMUNITY): Payer: Self-pay

## 2024-03-03 MED ORDER — ALPRAZOLAM 0.5 MG PO TABS
ORAL_TABLET | ORAL | 0 refills | Status: AC
  Filled 2024-03-03 – 2024-03-16 (×2): qty 3, 1d supply, fill #0

## 2024-03-03 NOTE — Addendum Note (Signed)
 Addended by: VEAR CHARLIE LABOR on: 03/03/2024 05:03 PM   Modules accepted: Orders

## 2024-03-03 NOTE — Telephone Encounter (Signed)
 I called her to schedule her MRIs and she said she gets panic attacks when doing them. She said in the past she's done them with xanax  or under anesthesia and asked if those were options. I let her know that [facility] is scheduling into February for sedated MRIs and she said that she will try an open MRI at [facility] with Xanax  please. I faxed the orders. Their phone number is [phone]

## 2024-03-03 NOTE — Telephone Encounter (Signed)
 Dr. Vear- this is the pt we verbally spoke about. Please e-scribe rx to pharmacy, thank you

## 2024-03-03 NOTE — Addendum Note (Signed)
 Addended by: JOSHUA MAURILIO CROME on: 03/03/2024 01:45 PM   Modules accepted: Orders

## 2024-03-12 ENCOUNTER — Other Ambulatory Visit (HOSPITAL_COMMUNITY): Payer: Self-pay

## 2024-03-15 ENCOUNTER — Other Ambulatory Visit: Payer: Self-pay

## 2024-03-16 ENCOUNTER — Other Ambulatory Visit (HOSPITAL_COMMUNITY): Payer: Self-pay

## 2024-03-19 DIAGNOSIS — M47812 Spondylosis without myelopathy or radiculopathy, cervical region: Secondary | ICD-10-CM | POA: Diagnosis not present

## 2024-03-19 DIAGNOSIS — G35 Multiple sclerosis: Secondary | ICD-10-CM | POA: Diagnosis not present

## 2024-03-19 DIAGNOSIS — M4802 Spinal stenosis, cervical region: Secondary | ICD-10-CM | POA: Diagnosis not present

## 2024-03-19 DIAGNOSIS — R269 Unspecified abnormalities of gait and mobility: Secondary | ICD-10-CM | POA: Diagnosis not present

## 2024-03-24 ENCOUNTER — Other Ambulatory Visit: Payer: Self-pay | Admitting: Neurology

## 2024-03-24 ENCOUNTER — Ambulatory Visit: Attending: Neurology | Admitting: Physical Therapy

## 2024-03-24 ENCOUNTER — Telehealth: Payer: Self-pay | Admitting: Physical Therapy

## 2024-03-24 VITALS — BP 98/76 | HR 82

## 2024-03-24 DIAGNOSIS — R208 Other disturbances of skin sensation: Secondary | ICD-10-CM | POA: Diagnosis not present

## 2024-03-24 DIAGNOSIS — G35 Multiple sclerosis: Secondary | ICD-10-CM | POA: Diagnosis not present

## 2024-03-24 DIAGNOSIS — R4189 Other symptoms and signs involving cognitive functions and awareness: Secondary | ICD-10-CM

## 2024-03-24 DIAGNOSIS — R42 Dizziness and giddiness: Secondary | ICD-10-CM | POA: Diagnosis not present

## 2024-03-24 DIAGNOSIS — R29898 Other symptoms and signs involving the musculoskeletal system: Secondary | ICD-10-CM

## 2024-03-24 DIAGNOSIS — R269 Unspecified abnormalities of gait and mobility: Secondary | ICD-10-CM | POA: Diagnosis not present

## 2024-03-24 DIAGNOSIS — R2689 Other abnormalities of gait and mobility: Secondary | ICD-10-CM | POA: Diagnosis not present

## 2024-03-24 DIAGNOSIS — R2681 Unsteadiness on feet: Secondary | ICD-10-CM | POA: Insufficient documentation

## 2024-03-24 DIAGNOSIS — R278 Other lack of coordination: Secondary | ICD-10-CM | POA: Diagnosis not present

## 2024-03-24 DIAGNOSIS — M6281 Muscle weakness (generalized): Secondary | ICD-10-CM | POA: Insufficient documentation

## 2024-03-24 NOTE — Telephone Encounter (Signed)
 Dr. Vear,  Jamie Watson  was evaluated by PT on 03/24/2024.  The patient would benefit from OT evaluation for R hand weakness and a Speech Therapy evaluation for cognitive impairments (memory, problem-solving)   If you agree, please place an order in [facility]que in [facility] or fax the order to [phone].  Thank you,  Waddell Southgate, PT, DPT, [facility] [address] [place], [place]  [address] Phone:  [phone] Fax:  [phone]

## 2024-03-24 NOTE — Therapy (Signed)
 OUTPATIENT PHYSICAL THERAPY NEURO EVALUATION   Patient Name: Jamie Watson MRN: [id number] DOB:08-09-1970, 53 y.o., female Today's Date: 03/24/2024   PCP: Jamie Garnette CHRISTELLA, MD REFERRING PROVIDER: Vear Charlie LABOR, MD  END OF SESSION:  PT End of Session - 03/24/24 1313     Visit Number 1    Number of Visits 7   with eval   Date for PT Re-Evaluation 05/19/24    Authorization Type [facility] [employer]    PT Start Time 1313    PT Stop Time 1357    PT Time Calculation (min) 44 min    Activity Tolerance Patient tolerated treatment well    Behavior During Therapy [facility] for tasks assessed/performed          Past Medical History:  Diagnosis Date   Acute pain of left knee 04/19/2019   Chronic headaches    Fatigue    Hypertension    Irregular heart beat    Menopause    Migraine    Multiple sclerosis (HCC)    Right arm pain 04/19/2019   Seasonal allergies    Snoring    Weight gain    Past Surgical History:  Procedure Laterality Date   TONSILLECTOMY AND ADENOIDECTOMY     Patient Active Problem List   Diagnosis Date Noted   Primary insomnia 12/02/2023   Dyshidrotic eczema 12/02/2023   Gait disturbance 08/13/2023   Depression, major, single episode, moderate (HCC) 07/18/2023   Allodynia 07/18/2023   Vertigo 04/17/2023   High risk medication use 04/08/2023   Vitamin D  deficiency 04/08/2023   Weakness 03/27/2023   MS (multiple sclerosis) (HCC) 03/27/2023   Hypokalemia 03/27/2023   Obesity (BMI 30-39.9) 03/27/2023   Borderline hyperlipidemia 01/31/2022   Prediabetes 01/31/2022   Essential hypertension 01/28/2022   Marijuana use, continuous 01/28/2022    ONSET DATE: 02/25/2024  REFERRING DIAG: G35 (ICD-10-CM) - Multiple sclerosis exacerbation (HCC) R26.9 (ICD-10-CM) - Gait disturbance R20.8 (ICD-10-CM) - Dysesthesia  THERAPY DIAG:  No diagnosis found.  Rationale for Evaluation and Treatment: Rehabilitation  SUBJECTIVE:                                                                                                                                                                                              SUBJECTIVE STATEMENT:  Pt presents to PT evaluation alone, no AD. She was diagnosed with MS in September 2024 and has been following with Dr. Vear. Next Ocrevus  infusion is Nov 2025. She reports that things have been up and down, still is a lot to take in. She knew when she was initially hospitalized for her MS that she needed  to follow-up with PT, she wants to make sure that she is getting the best care and taking care of herself. She has been dealing with aches and pains as well as mobility issues. Per report she appears to have relapsing remitting MS, not formally stated in her chart. Her last bad flare-up was 3-4 months ago. No follow-up currently scheduled with Dr. Vear, she last saw him 03/01/24.  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 Her job is very physical and she is on her feet all day and moving around a lot. She also feels like she has issues with her coordination and will drop things with her R hand at times.  Pt also complains of vertigo and blurry vision. She reports she has been dealing with this for 1.5 years which is what initially prompted her to get checked out and led to her MS diagnosis. She does have medication for her vertigo as it is under the MS umbrella. Currently she has been experiencing the vertigo and blurry vision for the past 30 days. If she moves a certain way she will get dizzy, lightheaded (laying down to standing up, even just laying down at times she feels like the room is spinning).  Pt complains of R knee pain and R elbow pain. She had R elbow xrays performed that showed no abnormalities. She also has allodynia in the L side of her body, is very sensitive to light touch and it feels like a shocking sensation so she has to wear long sleeves at all times. Occasionally she will get spasms, last time was in her  L thigh and it was so painful it brought her to tears.  Pt accompanied by: self  PERTINENT HISTORY: PMH: depression, allodynia, vertigo, MS (diagnosed 03/2023), HLD, prediabetes, HTN  Per Dr. Vear note 02/25/2024 HISTORY OF PRESENT ILLNESS:  Jamie Watson is a 53 y.o. woman with multiple sclerosis.   Update 02/25/2024 She started Ocrevus  in November 2024 and tolerated the infusions well.  Next infusion November 2025.  No new neurologic symptoms.   She is not reporting a wearing off effect.   She has no systemic side effects with the infusions.   She is reporting bilateral arm pain.  On the right she reports the pain is near the elbow.   On the left, pain is more tingling dysesthesia , arm > leg   She saw Dr. Jerri of orthopedics who also did x-rays of her right elbow and shoulder.  No etiology of her pain was discovered    Currently, balance is still a little off and she use the bannister on stairs.   Leg strength improved.  Her riht knee buckles at times, especially down stairs, due to pain.    She has had some knee pain off/on in past since an injury 3-4 years ago - but seems worse recently.     Her left arm numbness has changed to a mild dysesthesia with allodynia.    Leg sensation is altere on the left as well.  The dysestheias feel like ice, sometimes uncomfortable.     Grip and writing improved.       She notes urinary urgency and some incontinence, ac ouple times a week now   She notes reduced left vision the past several years, even before any other neurologic symptom.   She notes more fatigue - always better for a week after the infusion but more of a problem th rest of the  time.   She has had some anxiety.    She has irritability and mild depression.   She has insomnia and has a lot of thoughts running through her head.  She feels memory and attention are a little worse.  PAIN:  Are you having pain? Yes: NPRS scale: 7.5-8 Pain location: R knee, R elbow Pain description:  achy Aggravating factors: going up and down stairs, walking Relieving factors: sitting down, shifting weight to L side  PRECAUTIONS: None  RED FLAGS: None   WEIGHT BEARING RESTRICTIONS: No  FALLS: Has patient fallen in last 6 months? No  LIVING ENVIRONMENT: Lives with: lives alone Lives in: House/apartment Stairs: Yes: External: 40 steps; bilateral but cannot reach both Has following equipment at home: None  PLOF: Independent with gait, Independent with transfers, and takes more time with cooking, cleaning  PATIENT GOALS: get some type of regimen, she used to do chair yoga, getting a routine down for exercise  OBJECTIVE:  Note: Objective measures were completed at Evaluation unless otherwise noted.  DIAGNOSTIC FINDINGS: Pending updated brain and cervical MRI (ordered but not scheduled)  COGNITION: Overall cognitive status: pt reports issues with memory, problem solving, thinking   SENSATION: Hypersensitive on L side to light touch, feels like freezer burn, had to wear long sleeves day and night  COORDINATION: Impaired in R hand, drops things at times  EDEMA:  L foot tends to swell at night  POSTURE: forward head  LOWER EXTREMITY ROM:   WFL  Active  Right Eval Left Eval  Hip flexion    Hip extension    Hip abduction    Hip adduction    Hip internal rotation    Hip external rotation    Knee flexion    Knee extension    Ankle dorsiflexion    Ankle plantarflexion    Ankle inversion    Ankle eversion     (Blank rows = not tested)  LOWER EXTREMITY MMT:    MMT Right Eval Left Eval  Hip flexion 5 5  Hip extension    Hip abduction    Hip adduction    Hip internal rotation    Hip external rotation    Knee flexion 5 5  Knee extension 5 5  Ankle dorsiflexion 5 5  Ankle plantarflexion    Ankle inversion    Ankle eversion    (Blank rows = not tested)  BED MOBILITY:  Independent per pt report  TRANSFERS: Sit to stand: Modified independence   Assistive device utilized: None     Stand to sit: Modified independence  Assistive device utilized: None     Chair to chair: Modified independence  Assistive device utilized: None       RAMP:  Not tested  CURB:  Not tested  STAIRS:  STAIRS:  Level of Assistance: SBA  Stair Negotiation Technique: Alternating Pattern  with Single Rail on Right  Number of Stairs: 6   Height of Stairs: 4  Comments: WFL   GAIT: Findings: Gait pattern: WFL Distance walked: various clinic distances Assistive device utilized: None Level of assistance: Modified independence Comments: no significant abnormalities   FUNCTIONAL TESTS:    [facility] PT Assessment - 03/24/24 1349       Ambulation/Gait   Gait velocity 32.8 ft over 8.35 sec = 3.92 ft/sec   no AD     Standardized Balance Assessment   Standardized Balance Assessment Timed Up and Go Test;Five Times Sit to Stand    Five times sit to  stand comments  23 sec   no UE     Timed Up and Go Test   TUG Normal TUG    Normal TUG (seconds) 7.66   no AD     Functional Gait  Assessment   Gait assessed  Yes    Gait Level Surface Walks 20 ft in less than 5.5 sec, no assistive devices, good speed, no evidence for imbalance, normal gait pattern, deviates no more than 6 in outside of the 12 in walkway width.    Change in Gait Speed Able to smoothly change walking speed without loss of balance or gait deviation. Deviate no more than 6 in outside of the 12 in walkway width.    Gait with Horizontal Head Turns Performs head turns smoothly with slight change in gait velocity (eg, minor disruption to smooth gait path), deviates 6-10 in outside 12 in walkway width, or uses an assistive device.    Gait with Vertical Head Turns Performs task with slight change in gait velocity (eg, minor disruption to smooth gait path), deviates 6 - 10 in outside 12 in walkway width or uses assistive device    Gait and Pivot Turn Pivot turns safely within 3 sec and stops quickly with  no loss of balance.    Step Over Obstacle Is able to step over 2 stacked shoe boxes taped together (9 in total height) without changing gait speed. No evidence of imbalance.    Gait with Narrow Base of Support Ambulates 4-7 steps.    Gait with Eyes Closed Walks 20 ft, slow speed, abnormal gait pattern, evidence for imbalance, deviates 10-15 in outside 12 in walkway width. Requires more than 9 sec to ambulate 20 ft.    Ambulating Backwards Walks 20 ft, uses assistive device, slower speed, mild gait deviations, deviates 6-10 in outside 12 in walkway width.    Steps Alternating feet, must use rail.    Total Score 22    FGA comment: 22/30, medium fall risk           PATIENT SURVEYS:  MSIS-29: 76/80, converted score 93%   VITALS Vitals:   03/24/24 1333  BP: 98/76  Pulse: 82                                                                                                                                 TREATMENT DATE: PT Evaluation   Self-Care/Home Management Seated BP assessed in RUE at rest (see above) and BP is low. No complaints of lightheadedness or dizziness. Pt does check her BP at home and it is typically WNL. Dicussed potential for online support groups. Pt does have a friend with MS as well that she meets with regularly.   PATIENT EDUCATION: Education details: Eval findings, PT POC, results of OM and functional implications, plan to assess/address vertigo as able keeping in mind central causes Person educated: Patient Education method: Explanation and Demonstration Education comprehension: verbalized understanding, returned  demonstration, and needs further education  HOME EXERCISE PROGRAM: To be initiated  GOALS: Goals reviewed with patient? Yes  SHORT TERM GOALS: Target date: 04/14/2024   Pt will be independent with initial HEP for improved strength, balance, transfers and gait. Baseline: Goal status: INITIAL  2.  Pt will improve 5 x STS to less than or equal to  20 seconds to demonstrate improved functional strength and transfer efficiency.  Baseline: 23 sec no UE (9/17) Goal status: INITIAL  3.  Vestibular system to be assessed and STG set as appropriate Baseline:  Goal status: INITIAL   LONG TERM GOALS: Target date: 05/05/2024   Pt will be independent with final HEP for improved strength, balance, transfers and gait. Baseline:  Goal status: INITIAL  2.  Pt will improve 5 x STS to less than or equal to 17 seconds to demonstrate improved functional strength and transfer efficiency.  Baseline: 23 sec no UE (9/17) Goal status: INITIAL  3.  Vestibular system to be assessed and LTG set as appropriate Baseline:  Goal status: INITIAL  4.  Pt will improve FGA to 26/30 for decreased fall risk  Baseline: 22/30 (9/17) Goal status: INITIAL    ASSESSMENT:  CLINICAL IMPRESSION: Patient is a 52 year old female referred to Neuro OPPT for symptoms related to her MS.   Pt's PMH is significant for: depression, allodynia, vertigo, MS (diagnosed 03/2023), HLD, prediabetes, HTN. The following deficits were present during the exam: sensory impairments in L hemibody, impaired functional strength, mild balance deficits, R knee pain, and vertigo with blurry vision. Based on her sensory impairments and FGA score of 22/30, pt is an increased risk for falls. Pt would benefit from skilled PT to address these impairments and functional limitations to maximize functional mobility independence.   OBJECTIVE IMPAIRMENTS: decreased activity tolerance, decreased balance, decreased cognition, decreased coordination, decreased endurance, decreased knowledge of condition, decreased mobility, difficulty walking, decreased strength, impaired perceived functional ability, increased muscle spasms, impaired sensation, impaired UE functional use, impaired vision/preception, and pain.   ACTIVITY LIMITATIONS: carrying, lifting, bending, and squatting  PARTICIPATION LIMITATIONS:  meal prep and cleaning  PERSONAL FACTORS: Sex and 1-2 comorbidities:  depression, allodynia, vertigo, MS (diagnosed 03/2023), HLD, prediabetes, HTNare also affecting patient's functional outcome.   REHAB POTENTIAL: Good  CLINICAL DECISION MAKING: Evolving/moderate complexity  EVALUATION COMPLEXITY: Moderate  PLAN:  PT FREQUENCY: 1x/week  PT DURATION: 6 weeks  PLANNED INTERVENTIONS: 97164- PT Re-evaluation, 97750- Physical Performance Testing, 97110-Therapeutic exercises, 97530- Therapeutic activity, 97112- Neuromuscular re-education, 97535- Self Care, [id number]- Manual therapy, [phone]- Gait training, [phone]- Orthotic Initial, [phone]- Orthotic/Prosthetic subsequent, [phone]- Aquatic Therapy, [phone]- Electrical stimulation (manual), Patient/Family education, Balance training, Stair training, Taping, Joint mobilization, Spinal mobilization, Vestibular training, Visual/preceptual remediation/compensation, Cognitive remediation, DME instructions, Cryotherapy, and Moist heat  PLAN FOR NEXT SESSION: did we get referral for OT and SLP?, assess R knee pain further, assess vestibular system/vertigo as appropriate - pt aware symptoms can be central in origin, help her to establish an HEP to work on functional strengthening and balance   Waddell Southgate, PT Waddell Southgate, PT, DPT, CSRS  03/24/2024, 2:00 PM

## 2024-03-24 NOTE — Telephone Encounter (Signed)
 Done. Thanks.

## 2024-03-25 ENCOUNTER — Telehealth: Payer: Self-pay | Admitting: Neurology

## 2024-03-25 NOTE — Telephone Encounter (Signed)
 Referral for speech therapy sent through [facility] to [facility]. Phone: [phone]

## 2024-03-25 NOTE — Telephone Encounter (Signed)
 Referral occupational therapy sent through [facility] to [facility]. Phone: [phone]

## 2024-03-30 ENCOUNTER — Telehealth: Payer: Self-pay | Admitting: *Deleted

## 2024-03-30 NOTE — Telephone Encounter (Signed)
 I called pt left message. Need a release form payment and request for fmla form.

## 2024-03-31 ENCOUNTER — Telehealth: Payer: Self-pay | Admitting: Neurology

## 2024-03-31 NOTE — Telephone Encounter (Signed)
 Pt came into office to pay for FMLA forms, states on forms her workday should not exceed 10 hours. Also the word consecutive needs to be removed for the 4 days a month allowed with FMLA.

## 2024-04-01 DIAGNOSIS — Z0289 Encounter for other administrative examinations: Secondary | ICD-10-CM

## 2024-04-05 ENCOUNTER — Encounter

## 2024-04-06 ENCOUNTER — Telehealth: Payer: Self-pay | Admitting: *Deleted

## 2024-04-06 NOTE — Telephone Encounter (Signed)
 Gave completed/signed form back to medical records to process for pt.

## 2024-04-06 NOTE — Telephone Encounter (Signed)
 Pt [employer] form faxed on 04/06/2024 to [phone]

## 2024-04-08 ENCOUNTER — Encounter: Payer: Self-pay | Admitting: Physical Therapy

## 2024-04-08 ENCOUNTER — Ambulatory Visit: Attending: Neurology | Admitting: Physical Therapy

## 2024-04-08 VITALS — BP 114/78 | HR 77

## 2024-04-08 DIAGNOSIS — R41840 Attention and concentration deficit: Secondary | ICD-10-CM | POA: Insufficient documentation

## 2024-04-08 DIAGNOSIS — R41842 Visuospatial deficit: Secondary | ICD-10-CM | POA: Insufficient documentation

## 2024-04-08 DIAGNOSIS — R42 Dizziness and giddiness: Secondary | ICD-10-CM | POA: Insufficient documentation

## 2024-04-08 DIAGNOSIS — M6281 Muscle weakness (generalized): Secondary | ICD-10-CM | POA: Diagnosis not present

## 2024-04-08 DIAGNOSIS — R2689 Other abnormalities of gait and mobility: Secondary | ICD-10-CM | POA: Diagnosis not present

## 2024-04-08 DIAGNOSIS — R278 Other lack of coordination: Secondary | ICD-10-CM | POA: Diagnosis not present

## 2024-04-08 DIAGNOSIS — R2681 Unsteadiness on feet: Secondary | ICD-10-CM | POA: Diagnosis not present

## 2024-04-08 NOTE — Therapy (Signed)
 OUTPATIENT OCCUPATIONAL THERAPY NEURO EVALUATION  Patient Name: Jamie Watson MRN: [id number] DOB:Nov 24, 1970, 53 y.o., female Today's Date: 04/12/2024  PCP: Thedora Garnette CHRISTELLA, MD REFERRING PROVIDER: Dr. Vear  END OF SESSION:  OT End of Session - 04/12/24 1032     Visit Number 1    Number of Visits 8    Date for Recertification  06/06/24    Authorization Type [facility] [employer]    OT Start Time 0845    OT Stop Time 0935    OT Time Calculation (min) 50 min    Activity Tolerance Patient tolerated treatment well    Behavior During Therapy [facility] for tasks assessed/performed          Past Medical History:  Diagnosis Date   Acute pain of left knee 04/19/2019   Chronic headaches    Fatigue    Hypertension    Irregular heart beat    Menopause    Migraine    Multiple sclerosis    Right arm pain 04/19/2019   Seasonal allergies    Snoring    Weight gain    Past Surgical History:  Procedure Laterality Date   TONSILLECTOMY AND ADENOIDECTOMY     Patient Active Problem List   Diagnosis Date Noted   Primary insomnia 12/02/2023   Dyshidrotic eczema 12/02/2023   Gait disturbance 08/13/2023   Depression, major, single episode, moderate (HCC) 07/18/2023   Allodynia 07/18/2023   Vertigo 04/17/2023   High risk medication use 04/08/2023   Vitamin D  deficiency 04/08/2023   Weakness 03/27/2023   MS (multiple sclerosis) 03/27/2023   Hypokalemia 03/27/2023   Obesity (BMI 30-39.9) 03/27/2023   Borderline hyperlipidemia 01/31/2022   Prediabetes 01/31/2022   Essential hypertension 01/28/2022   Marijuana use, continuous 01/28/2022    ONSET DATE: 03/24/24 (referral date)   REFERRING DIAG: G35 (ICD-10-CM) - Multiple sclerosis exacerbation R29.898 (ICD-10-CM) - Right hand weakness  THERAPY DIAG:  Muscle weakness (generalized)  Other lack of coordination  Unsteadiness on feet  Attention and concentration deficit  Visuospatial deficit  Rationale for Evaluation and Treatment:  Rehabilitation  SUBJECTIVE:   SUBJECTIVE STATEMENT: Diagnosed with MS in September 2024 and has been following with Dr. Vear.  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 Her job is very physical and she is on her feet all day and moving around a lot. She also feels like she has issues with her coordination and will drop things with her R hand at times.   Pt also complains of vertigo and blurry vision. She reports she has been dealing with this for 1.5 years which is what initially prompted her to get checked out and led to her MS diagnosis. She does have medication for her vertigo as it is under the MS umbrella. Currently she has been experiencing the vertigo and blurry vision for the past 30 days. If she moves a certain way she will get dizzy, lightheaded (laying down to standing up, even just laying down at times she feels like the room is spinning).  Pt accompanied by: self  PERTINENT HISTORY:  PMH: depression, allodynia, vertigo, MS (diagnosed 03/2023), HLD, prediabetes, HTN  PRECAUTIONS: None  WEIGHT BEARING RESTRICTIONS: No  PAIN:  Are you having pain? BUE's 5/10 constant, rest alleviates  FALLS: Has patient fallen in last 6 months? No  LIVING ENVIRONMENT: Lives with: lives alone Lives in: 4th floor apt Stairs: Yes: External: 40 steps; bilateral but cannot reach both Has following equipment at home: None  PLOF: Independent with gait, Independent with transfers, and takes more time with cooking, cleaning  PATIENT GOALS: improve strength in hands, decrease dropping  OBJECTIVE:  Note: Objective measures were completed at Evaluation unless otherwise noted.  HAND DOMINANCE: Right  ADLs: Overall ADLs: Mod I  Transfers/ambulation related to ADLs: independent  Eating: mod I  Grooming: mod I  UB Dressing: mod I  LB Dressing: mod I  (difficulty w/ button on pants)  Toileting: mod I  Bathing: mod I  Tub Shower transfers: mod I - pt reports has  improved  Equipment: none  IADLs: Shopping: occasional assist getting groceries into apt on 4th floor Light housekeeping: mod I  Meal Prep: mod I  Community mobility: driving but difficulty at night Medication management: mod I  Financial management: mod I  Handwriting: 100% legible  MOBILITY STATUS: Independent   ACTIVITY TOLERANCE: Activity tolerance: fatigues and requires rest breaks after approx 15 min. Per pt report  FUNCTIONAL OUTCOME MEASURES: Upper Extremity Functional Scale (UEFS): TBA   UPPER EXTREMITY ROM:  BUE AROM WFL's   UPPER EXTREMITY MMT:   BUE MMT grossly WFL's - slightly weaker LUE   HAND FUNCTION: Grip strength: Right: 54 lbs; Left: 43.4 lbs  COORDINATION: 9 Hole Peg test: Right: 23.89 sec; Left: 29.74 sec   SENSATION: Hypersensitive on L side to light touch, feels like freezer burn, had to wear long sleeves day and night  EDEMA: None during evaluation, but notes sometimes jewelry is tighter   COGNITION: Overall cognitive status: pt reports issues with memory, problem solving, thinking but does not interfere with bill paying, medications. Pt reports occasional difficulty w/ job tasks (especially processing)   VISION: Subjective report: I have blurred vision, and mostly double vision w/o my glasses Baseline vision: Wears glasses all the time Visual history: only relating to MS, also keratoconus  VISION ASSESSMENT: To be further assessed in functional context  Patient has difficulty with following activities due to following visual impairments: job duties, reading, etc  PERCEPTION: Not tested  PRAXIS: Not tested  OBSERVATIONS: pt w/ overall fatigue, altered sensation, decreased endurance, vision deficits and mild cognitive deficits                                                                                                                             TREATMENT DATE: 04/12/24  Therapist discussed general things to avoid with MS  (Sweating, hot tubs, overexertion, etc). Also educated pt in energy conservation techniques w/ handout provided (see pt instructions for details)   Reviewed O.T. findings and POC/goals         PATIENT EDUCATION: Education details: energy conservation techniques, things to avoid Person educated: Patient Education method: Explanation, Verbal cues, and Handouts Education comprehension: verbalized understanding  HOME EXERCISE PROGRAM: 04/12/24: energy conservation techniques   GOALS: Goals reviewed with patient? Yes  SHORT TERM GOALS: Target date: 05/06/24  Independent with HEP for BUE's (ROM/endurance)  Baseline: Goal status: INITIAL  2.  Independent with HEP for hand strength and coordination Baseline:  Goal status: INITIAL  3.  Pt to verbalize understanding with energy conservation techniques Baseline:  Goal status: IN PROGRESS  4.  Pt to verbalize understanding with vision HEP and some accommodations for job duties related to vision Baseline:  Goal status: INITIAL   LONG TERM GOALS: Target date: 06/06/24  Pt to improve UEFI scale by 10% Baseline: TBA Goal status: INITIAL  2.  Pt to verbalize understanding with education re:  impaired sensory including desensitization Lt hand, less dropping and  and potential A/E to increase safety w/ cooking RT hand Baseline:  Goal status: INITIAL  3.  Pt to report overall less dropping w/ items Rt hand Baseline:  Goal status: INITIAL  4.  Improve grip strength Lt hand by 5 lbs  Baseline: 43 lbs (Rt = 54 lbs)  Goal status: INITIAL  5.  Pt to verbalize understanding with memory compensatory strategies and ways to keep thinking skills sharp  Baseline:  Goal status: INITIAL   ASSESSMENT:  CLINICAL IMPRESSION: Patient is a 53 year old female referred to Neuro OPOT for symptoms related to her MS.   Pt's PMH is significant for: depression, allodynia, vertigo, MS (diagnosed 03/2023), HLD, prediabetes, HTN. Patient currently  presents slightly below baseline level of functioning demonstrating functional deficits and impairments as noted below. Pt would benefit from skilled OT services in the outpatient setting to work on impairments as noted below to help pt return to PLOF as able.       PERFORMANCE DEFICITS: in functional skills including ADLs, IADLs, coordination, dexterity, sensation, edema, strength, pain, Fine motor control, Gross motor control, mobility, body mechanics, endurance, decreased knowledge of precautions, decreased knowledge of use of DME, vision, and UE functional use, cognitive skills including attention, memory, and problem solving, and psychosocial skills including coping strategies.   IMPAIRMENTS: are limiting patient from IADLs, work, and leisure.   CO-MORBIDITIES: may have co-morbidities  that affects occupational performance. Patient will benefit from skilled OT to address above impairments and improve overall function.  MODIFICATION OR ASSISTANCE TO COMPLETE EVALUATION: No modification of tasks or assist necessary to complete an evaluation.  OT OCCUPATIONAL PROFILE AND HISTORY: Detailed assessment: Review of records and additional review of physical, cognitive, psychosocial history related to current functional performance.  CLINICAL DECISION MAKING: Moderate - several treatment options, min-mod task modification necessary  REHAB POTENTIAL: Good  EVALUATION COMPLEXITY: Moderate    PLAN:  OT FREQUENCY: 1x/week  OT DURATION: 8 weeks  PLANNED INTERVENTIONS: 97535 self care/ADL training, 02889 therapeutic exercise, 97530 therapeutic activity, 97112 neuromuscular re-education, 97140 manual therapy, 97113 aquatic therapy, 97018 paraffin, 02960 fluidotherapy, 97010 moist heat, 97129 Cognitive training (first 15 min), 02869 Cognitive training(each additional 15 min), passive range of motion, visual/perceptual remediation/compensation, energy conservation, coping strategies training,  patient/family education, and DME and/or AE instructions  RECOMMENDED OTHER SERVICES: none at this time  CONSULTED AND AGREED WITH PLAN OF CARE: Patient  PLAN FOR NEXT SESSION: Assess UEFI scale AND update LTG #1, further assess vision, cane HEP    Burnard JINNY Roads, OT 04/12/2024, 10:48 AM

## 2024-04-08 NOTE — Patient Instructions (Signed)
 Gaze Stabilization: Sitting    Keeping eyes on target on wall a few feet away, tilt head down 15-30 and move head side to side for __30__ seconds. Repeat while moving head up and down for __30__ seconds.  Perform 3 sets of each.  Do ___2_ sessions per day. Use a plain background.   Copyright  [employer]. All rights reserved.

## 2024-04-08 NOTE — Therapy (Signed)
 OUTPATIENT PHYSICAL THERAPY NEURO TREATMENT   Patient Name: Jamie Watson MRN: [id number] DOB:1971/02/02, 53 y.o., female Today's Date: 04/08/2024   PCP: Thedora Garnette CHRISTELLA, MD REFERRING PROVIDER: Vear Charlie LABOR, MD  END OF SESSION:  PT End of Session - 04/08/24 0930     Visit Number 2    Number of Visits 7   with eval   Date for Recertification  05/19/24    Authorization Type [facility] [employer]    PT Start Time [phone]    PT Stop Time 1011    PT Time Calculation (min) 42 min    Activity Tolerance Patient tolerated treatment well    Behavior During Therapy [facility] for tasks assessed/performed          Past Medical History:  Diagnosis Date   Acute pain of left knee 04/19/2019   Chronic headaches    Fatigue    Hypertension    Irregular heart beat    Menopause    Migraine    Multiple sclerosis    Right arm pain 04/19/2019   Seasonal allergies    Snoring    Weight gain    Past Surgical History:  Procedure Laterality Date   TONSILLECTOMY AND ADENOIDECTOMY     Patient Active Problem List   Diagnosis Date Noted   Primary insomnia 12/02/2023   Dyshidrotic eczema 12/02/2023   Gait disturbance 08/13/2023   Depression, major, single episode, moderate (HCC) 07/18/2023   Allodynia 07/18/2023   Vertigo 04/17/2023   High risk medication use 04/08/2023   Vitamin D  deficiency 04/08/2023   Weakness 03/27/2023   MS (multiple sclerosis) 03/27/2023   Hypokalemia 03/27/2023   Obesity (BMI 30-39.9) 03/27/2023   Borderline hyperlipidemia 01/31/2022   Prediabetes 01/31/2022   Essential hypertension 01/28/2022   Marijuana use, continuous 01/28/2022    ONSET DATE: 02/25/2024  REFERRING DIAG: G35 (ICD-10-CM) - Multiple sclerosis exacerbation (HCC) R26.9 (ICD-10-CM) - Gait disturbance R20.8 (ICD-10-CM) - Dysesthesia  THERAPY DIAG:  Muscle weakness (generalized)  Other abnormalities of gait and mobility  Unsteadiness on feet  Rationale for Evaluation and Treatment:  Rehabilitation  SUBJECTIVE:                                                                                                                                                                                             SUBJECTIVE STATEMENT:  No changes since she was last here. Some days dizziness doesn't appear at all. Other days it will randomly show up and takes her a minute when she gets up. Has to remind herself to sit for a second. Finding herself needing to sit down around the house when  doing chores due to dizziness. Dizziness started before her MS diagnosis. Wants to stay active as best as she can to be able to provide for herself. Wants to know when she is overdoing it.   Pt accompanied by: self  PERTINENT HISTORY: PMH: depression, allodynia, vertigo, MS (diagnosed 03/2023), HLD, prediabetes, HTN  Per Dr. Vear note 02/25/2024 HISTORY OF PRESENT ILLNESS:  Jamie Watson is a 53 y.o. woman with multiple sclerosis.   Update 02/25/2024 She started Ocrevus  in November 2024 and tolerated the infusions well.  Next infusion November 2025.  No new neurologic symptoms.   She is not reporting a wearing off effect.   She has no systemic side effects with the infusions.   She is reporting bilateral arm pain.  On the right she reports the pain is near the elbow.   On the left, pain is more tingling dysesthesia , arm > leg   She saw Dr. Jerri of orthopedics who also did x-rays of her right elbow and shoulder.  No etiology of her pain was discovered    Currently, balance is still a little off and she use the bannister on stairs.   Leg strength improved.  Her riht knee buckles at times, especially down stairs, due to pain.    She has had some knee pain off/on in past since an injury 3-4 years ago - but seems worse recently.     Her left arm numbness has changed to a mild dysesthesia with allodynia.    Leg sensation is altere on the left as well.  The dysestheias feel like ice, sometimes uncomfortable.     Grip  and writing improved.       She notes urinary urgency and some incontinence, ac ouple times a week now   She notes reduced left vision the past several years, even before any other neurologic symptom.   She notes more fatigue - always better for a week after the infusion but more of a problem th rest of the time.   She has had some anxiety.    She has irritability and mild depression.   She has insomnia and has a lot of thoughts running through her head.  She feels memory and attention are a little worse.  PAIN:  Are you having pain? Yes: NPRS scale: 7 Pain location: L side  Pain description: very uncomfortable feeling on L side  Aggravating factors: going up and down stairs, walking Relieving factors: sitting down, shifting weight to L side  PRECAUTIONS: None  RED FLAGS: None   WEIGHT BEARING RESTRICTIONS: No  FALLS: Has patient fallen in last 6 months? No  LIVING ENVIRONMENT: Lives with: lives alone Lives in: House/apartment Stairs: Yes: External: 40 steps; bilateral but cannot reach both Has following equipment at home: None  PLOF: Independent with gait, Independent with transfers, and takes more time with cooking, cleaning  PATIENT GOALS: get some type of regimen, she used to do chair yoga, getting a routine down for exercise  OBJECTIVE:  Note: Objective measures were completed at Evaluation unless otherwise noted.  DIAGNOSTIC FINDINGS: Pending updated brain and cervical MRI (ordered but not scheduled)  COGNITION: Overall cognitive status: pt reports issues with memory, problem solving, thinking   SENSATION: Hypersensitive on L side to light touch, feels like freezer burn, had to wear long sleeves day and night  COORDINATION: Impaired in R hand, drops things at times  EDEMA:  L foot tends to swell at night  POSTURE: forward head  LOWER EXTREMITY  ROM:   WFL  Active  Right Eval Left Eval  Hip flexion    Hip extension    Hip abduction    Hip adduction     Hip internal rotation    Hip external rotation    Knee flexion    Knee extension    Ankle dorsiflexion    Ankle plantarflexion    Ankle inversion    Ankle eversion     (Blank rows = not tested)  LOWER EXTREMITY MMT:    MMT Right Eval Left Eval  Hip flexion 5 5  Hip extension    Hip abduction    Hip adduction    Hip internal rotation    Hip external rotation    Knee flexion 5 5  Knee extension 5 5  Ankle dorsiflexion 5 5  Ankle plantarflexion    Ankle inversion    Ankle eversion    (Blank rows = not tested)  BED MOBILITY:  Independent per pt report  TRANSFERS: Sit to stand: Modified independence  Assistive device utilized: None     Stand to sit: Modified independence  Assistive device utilized: None     Chair to chair: Modified independence  Assistive device utilized: None       RAMP:  Not tested  CURB:  Not tested  STAIRS:  STAIRS:  Level of Assistance: SBA  Stair Negotiation Technique: Alternating Pattern  with Single Rail on Right  Number of Stairs: 6   Height of Stairs: 4  Comments: WFL   GAIT: Findings: Gait pattern: WFL Distance walked: various clinic distances Assistive device utilized: None Level of assistance: Modified independence Comments: no significant abnormalities   FUNCTIONAL TESTS:       PATIENT SURVEYS:  MSIS-29: 76/80, converted score 93%   VITALS Vitals:   04/08/24 0937  BP: 114/78  Pulse: 77                                                                                                                                  TREATMENT DATE: 04/08/24  Therapeutic Activity:   Vitals:   04/08/24 0937  BP: 114/78  Pulse: 77   Assessed BP and WNL   VESTIBULAR ASSESSMENT   GENERAL OBSERVATION: Ambulates in with no AD independently     SYMPTOM BEHAVIOR:   Subjective history: Pt reports her dizziness has been going on since before her MS diagnosis, its not as intense. A couple days ago, did not feel steady on  her feet and had to make herself sit down. Dizziness will come randomly and catch her off guard. Does not feel like she will faint, but feels like she might need to slow down a bit.    Non-Vestibular symptoms: changes in vision and blurred vision, saw eye doctor recently and was diagnosed with Keratoconus that can be contributing to her blurred vision    Type of dizziness: Lightheadedness/Faint and feeling a little weird, some slight spinning  Frequency: A day    Duration: 10+ hours, is an all day thing    Aggravating factors: Induced by motion: turning body quickly, turning head quickly, and quick movements, getting up out of bed    Relieving factors: sitting still    Progression of symptoms: better   OCULOMOTOR EXAM:   Ocular Alignment: normal   Ocular ROM: No Limitations   Spontaneous Nystagmus: absent   Gaze-Induced Nystagmus: absent   Smooth Pursuits: intact   Saccades: intact and ~2 saccadic beats in superior vertical direction, no dizziness     VESTIBULAR - OCULAR REFLEX:    Slow VOR: Normal - very slightly weird/dizzy    VOR Cancellation: Normal - feels a little slightly imbalanced    Head-Impulse Test: HIT Right: positive HIT Left: positive Incr dizziness afterwards and imbalance, worse on R side compared to L    MOTION SENSITIVITY:    Motion Sensitivity Quotient  Intensity: 0 = none, 1 = Lightheaded, 2 = Mild, 3 = Moderate, 4 = Severe, 5 = Vomiting  Intensity  1. Sitting to supine   2. Supine to L side   3. Supine to R side   4. Supine to sitting   5. L Hallpike-Dix   6. Up from L    7. R Hallpike-Dix   8. Up from R    9. Sitting, head  tipped to L knee 0  10. Head up from L  knee 0  11. Sitting, head  tipped to R knee 0  12. Head up from R  knee 0  13. Sitting head turns x5 2  14.Sitting head nods x5 1  15. In stance, 180  turn to L  0  16. In stance, 180  turn to R 0, no dizziness, just a little more challenged with coordination       M-CTSIB   Condition 1: Firm Surface, EO 30 Sec, Normal Sway  Condition 2: Firm Surface, EC 30 Sec, Normal Sway, felt unsteady   Condition 3: Foam Surface, EO 30 Sec, Normal Sway  Condition 4: Foam Surface, EC 30 Sec, Mild sway, felt rocky     NMR:  Gaze Adaptation: x1 Viewing Horizontal: Position: Seated, Time: 30 seconds, Reps: 2, and Comment: mild unsteadiness after slightly incr speed  x1 Viewing Vertical:  Position: Seated, Time: 30 seconds, Reps: 2, and Comment: mild weird feeling    PATIENT EDUCATION: Education details: Clinical findings, what PT will address in regards to dizziness and vestibular system for balance, initial HEP for seated VOR Person educated: Patient Education method: Explanation, Demonstration, Verbal cues, and Handouts Education comprehension: verbalized understanding, returned demonstration, and needs further education  HOME EXERCISE PROGRAM: Seated VOR 30 seconds - horizontal and vertical   GOALS: Goals reviewed with patient? Yes  SHORT TERM GOALS: Target date: 04/14/2024   Pt will be independent with initial HEP for improved strength, balance, transfers and gait. Baseline: Goal status: INITIAL  2.  Pt will improve 5 x STS to less than or equal to 20 seconds to demonstrate improved functional strength and transfer efficiency.  Baseline: 23 sec no UE (9/17) Goal status: INITIAL  3.  Vestibular system to be assessed and STG set as appropriate Baseline:  Goal status: INITIAL   LONG TERM GOALS: Target date: 05/05/2024   Pt will be independent with final HEP for improved strength, balance, transfers and gait. Baseline:  Goal status: INITIAL  2.  Pt will improve 5 x STS to less than or equal to 17  seconds to demonstrate improved functional strength and transfer efficiency.  Baseline: 23 sec no UE (9/17) Goal status: INITIAL  3.  Vestibular system to be assessed and LTG set as appropriate Baseline:  Goal status: INITIAL  4.  Pt will improve FGA to  26/30 for decreased fall risk  Baseline: 22/30 (9/17) Goal status: INITIAL    ASSESSMENT:  CLINICAL IMPRESSION: Today's skilled session focused on further assessing pt's vestibular system. Pt with slight dizziness with slow VOR and VOR cancellation and also had bilateral positive HT and incr dizziness afterwards. Pt able to hold all 4 conditions of mCTSIB for 30 seconds, but did feel incr unsteadiness with condition 4, indicating decr vestibular input for balance. Pt's dizziness appears to be more central related in regards to her MS. Added seated VOR x1 to HEP with pt feeling slightly more dizziness with vertical direction. Will continue per POC.   OBJECTIVE IMPAIRMENTS: decreased activity tolerance, decreased balance, decreased cognition, decreased coordination, decreased endurance, decreased knowledge of condition, decreased mobility, difficulty walking, decreased strength, impaired perceived functional ability, increased muscle spasms, impaired sensation, impaired UE functional use, impaired vision/preception, and pain.   ACTIVITY LIMITATIONS: carrying, lifting, bending, and squatting  PARTICIPATION LIMITATIONS: meal prep and cleaning  PERSONAL FACTORS: Sex and 1-2 comorbidities:  depression, allodynia, vertigo, MS (diagnosed 03/2023), HLD, prediabetes, HTNare also affecting patient's functional outcome.   REHAB POTENTIAL: Good  CLINICAL DECISION MAKING: Evolving/moderate complexity  EVALUATION COMPLEXITY: Moderate  PLAN:  PT FREQUENCY: 1x/week  PT DURATION: 6 weeks  PLANNED INTERVENTIONS: 97164- PT Re-evaluation, 97750- Physical Performance Testing, 97110-Therapeutic exercises, 97530- Therapeutic activity, [id number]- Neuromuscular re-education, 97535- Self Care, 02859- Manual therapy, [phone]- Gait training, [phone]- Orthotic Initial, [phone]- Orthotic/Prosthetic subsequent, [phone]- Aquatic Therapy, [phone]- Electrical stimulation (manual), Patient/Family education, Balance training, Stair  training, Taping, Joint mobilization, Spinal mobilization, Vestibular training, Visual/preceptual remediation/compensation, Cognitive remediation, DME instructions, Cryotherapy, and Moist heat  PLAN FOR NEXT SESSION: Finish vestibular assessment with MSQ/have pt fill out DHI and update these goals.   assess R knee pain further establish an HEP to work on functional strengthening and balance as well as EC for balance   Sheffield Senate, PT, DPT 04/08/24 10:13 AM

## 2024-04-11 ENCOUNTER — Encounter: Payer: Self-pay | Admitting: Neurology

## 2024-04-11 ENCOUNTER — Telehealth: Payer: Self-pay | Admitting: Neurology

## 2024-04-11 NOTE — Telephone Encounter (Signed)
 I personally reviewed the MRI of the brain and cervical spine from 03/19/2024 and compared them to her previous images from 03/26/2024.  The MRI of the brain was unchanged showing multiple T2/FLAIR hyperintense foci in the cerebral hemispheres and a pattern consistent with MS.  MRI of the cervical spine has a movement artifact but appears to be changed with a lesion seen to the right at C3.  It no longer enhances as it did in 2024.  There are degenerative changes.  These were maximal at C5-C6 where there was moderately severe bilateral foraminal narrowing that could affect the C6 nerve roots.  Also moderate right foraminal narrowing at C4-C5 though this probably does not affect the nerve roots

## 2024-04-12 ENCOUNTER — Ambulatory Visit

## 2024-04-12 ENCOUNTER — Ambulatory Visit: Admitting: Occupational Therapy

## 2024-04-12 VITALS — BP 112/78 | HR 80

## 2024-04-12 DIAGNOSIS — R2681 Unsteadiness on feet: Secondary | ICD-10-CM | POA: Diagnosis not present

## 2024-04-12 DIAGNOSIS — R41840 Attention and concentration deficit: Secondary | ICD-10-CM

## 2024-04-12 DIAGNOSIS — R278 Other lack of coordination: Secondary | ICD-10-CM

## 2024-04-12 DIAGNOSIS — R42 Dizziness and giddiness: Secondary | ICD-10-CM

## 2024-04-12 DIAGNOSIS — M6281 Muscle weakness (generalized): Secondary | ICD-10-CM

## 2024-04-12 DIAGNOSIS — R2689 Other abnormalities of gait and mobility: Secondary | ICD-10-CM

## 2024-04-12 DIAGNOSIS — R41842 Visuospatial deficit: Secondary | ICD-10-CM | POA: Diagnosis not present

## 2024-04-12 NOTE — Therapy (Signed)
 OUTPATIENT PHYSICAL THERAPY NEURO TREATMENT   Patient Name: Jamie Watson MRN: [id number] DOB:22-Jan-1971, 53 y.o., female Today's Date: 04/12/2024   PCP: Thedora Garnette CHRISTELLA, MD REFERRING PROVIDER: Vear Charlie LABOR, MD  END OF SESSION:  PT End of Session - 04/12/24 0929     Visit Number 3    Number of Visits 7    Date for Recertification  05/19/24    Authorization Type [facility] [employer]    PT Start Time 1019   received from OT   PT Stop Time 1058    PT Time Calculation (min) 39 min    Activity Tolerance Patient tolerated treatment well    Behavior During Therapy [facility] for tasks assessed/performed          Past Medical History:  Diagnosis Date   Acute pain of left knee 04/19/2019   Chronic headaches    Fatigue    Hypertension    Irregular heart beat    Menopause    Migraine    Multiple sclerosis    Right arm pain 04/19/2019   Seasonal allergies    Snoring    Weight gain    Past Surgical History:  Procedure Laterality Date   TONSILLECTOMY AND ADENOIDECTOMY     Patient Active Problem List   Diagnosis Date Noted   Primary insomnia 12/02/2023   Dyshidrotic eczema 12/02/2023   Gait disturbance 08/13/2023   Depression, major, single episode, moderate (HCC) 07/18/2023   Allodynia 07/18/2023   Vertigo 04/17/2023   High risk medication use 04/08/2023   Vitamin D  deficiency 04/08/2023   Weakness 03/27/2023   MS (multiple sclerosis) 03/27/2023   Hypokalemia 03/27/2023   Obesity (BMI 30-39.9) 03/27/2023   Borderline hyperlipidemia 01/31/2022   Prediabetes 01/31/2022   Essential hypertension 01/28/2022   Marijuana use, continuous 01/28/2022    ONSET DATE: 02/25/2024  REFERRING DIAG: G35 (ICD-10-CM) - Multiple sclerosis exacerbation (HCC) R26.9 (ICD-10-CM) - Gait disturbance R20.8 (ICD-10-CM) - Dysesthesia  THERAPY DIAG:  Muscle weakness (generalized)  Other abnormalities of gait and mobility  Unsteadiness on feet  Dizziness and giddiness  Other lack of  coordination  Rationale for Evaluation and Treatment: Rehabilitation  SUBJECTIVE:                                                                                                                                                                                             SUBJECTIVE STATEMENT: Patient received from OT. Reports dizziness is sporadic, but possibly slightly better. This happens mainly in the morning when she pops up too quickly. Denies falls.   Pt accompanied by: self  PERTINENT HISTORY: PMH: depression, allodynia, vertigo, MS (  diagnosed 03/2023), HLD, prediabetes, HTN  Per Dr. Vear note 02/25/2024 HISTORY OF PRESENT ILLNESS:  Jamie Watson is a 53 y.o. woman with multiple sclerosis.   Update 02/25/2024 She started Ocrevus  in November 2024 and tolerated the infusions well.  Next infusion November 2025.  No new neurologic symptoms.   She is not reporting a wearing off effect.   She has no systemic side effects with the infusions.   She is reporting bilateral arm pain.  On the right she reports the pain is near the elbow.   On the left, pain is more tingling dysesthesia , arm > leg   She saw Dr. Jerri of orthopedics who also did x-rays of her right elbow and shoulder.  No etiology of her pain was discovered    Currently, balance is still a little off and she use the bannister on stairs.   Leg strength improved.  Her riht knee buckles at times, especially down stairs, due to pain.    She has had some knee pain off/on in past since an injury 3-4 years ago - but seems worse recently.     Her left arm numbness has changed to a mild dysesthesia with allodynia.    Leg sensation is altere on the left as well.  The dysestheias feel like ice, sometimes uncomfortable.     Grip and writing improved.       She notes urinary urgency and some incontinence, ac ouple times a week now   She notes reduced left vision the past several years, even before any other neurologic symptom.   She notes more  fatigue - always better for a week after the infusion but more of a problem th rest of the time.   She has had some anxiety.    She has irritability and mild depression.   She has insomnia and has a lot of thoughts running through her head.  She feels memory and attention are a little worse.  PAIN:  Are you having pain? Yes: NPRS scale: 7 Pain location: L side  Pain description: very uncomfortable feeling on L side  Aggravating factors: going up and down stairs, walking Relieving factors: sitting down, shifting weight to L side  PRECAUTIONS: None   PATIENT GOALS: get some type of regimen, she used to do chair yoga, getting a routine down for exercise  OBJECTIVE:  Note: Objective measures were completed at Evaluation unless otherwise noted.  DIAGNOSTIC FINDINGS: Pending updated brain and cervical MRI (ordered but not scheduled)  COGNITION: Overall cognitive status: pt reports issues with memory, problem solving, thinking   SENSATION: Hypersensitive on L side to light touch, feels like freezer burn, had to wear long sleeves day and night  COORDINATION: Impaired in R hand, drops things at times  EDEMA:  L foot tends to swell at night  POSTURE: forward head  LOWER EXTREMITY ROM:   WFL  Active  Right Eval Left Eval  Hip flexion    Hip extension    Hip abduction    Hip adduction    Hip internal rotation    Hip external rotation    Knee flexion    Knee extension    Ankle dorsiflexion    Ankle plantarflexion    Ankle inversion    Ankle eversion     (Blank rows = not tested)  LOWER EXTREMITY MMT:    MMT Right Eval Left Eval  Hip flexion 5 5  Hip extension    Hip abduction    Hip  adduction    Hip internal rotation    Hip external rotation    Knee flexion 5 5  Knee extension 5 5  Ankle dorsiflexion 5 5  Ankle plantarflexion    Ankle inversion    Ankle eversion    (Blank rows = not tested)  BED MOBILITY:  Independent per pt report  TRANSFERS: Sit to  stand: Modified independence  Assistive device utilized: None     Stand to sit: Modified independence  Assistive device utilized: None     Chair to chair: Modified independence  Assistive device utilized: None       RAMP:  Not tested  CURB:  Not tested  STAIRS:  STAIRS:  Level of Assistance: SBA  Stair Negotiation Technique: Alternating Pattern  with Single Rail on Right  Number of Stairs: 6   Height of Stairs: 4  Comments: WFL   GAIT: Findings: Gait pattern: WFL Distance walked: various clinic distances Assistive device utilized: None Level of assistance: Modified independence Comments: no significant abnormalities   FUNCTIONAL TESTS:       PATIENT SURVEYS:  MSIS-29: 76/80, converted score 93%   VITALS Vitals:   04/12/24 1029  BP: 112/78  Pulse: 80                                                                                                                                   TREATMENT DATE: 04/08/24  Therapeutic Activity:   Vitals:   04/12/24 1029  BP: 112/78  Pulse: 80    Assessed BP and WNL   VESTIBULAR ASSESSMENT   GENERAL OBSERVATION: Ambulates in with no AD independently     SYMPTOM BEHAVIOR:   Subjective history: Pt reports her dizziness has been going on since before her MS diagnosis, its not as intense. A couple days ago, did not feel steady on her feet and had to make herself sit down. Dizziness will come randomly and catch her off guard. Does not feel like she will faint, but feels like she might need to slow down a bit.    Non-Vestibular symptoms: changes in vision and blurred vision, saw eye doctor recently and was diagnosed with Keratoconus that can be contributing to her blurred vision    Type of dizziness: Lightheadedness/Faint and feeling a little weird, some slight spinning    Frequency: A day    Duration: 10+ hours, is an all day thing    Aggravating factors: Induced by motion: turning body quickly, turning head quickly, and  quick movements, getting up out of bed    Relieving factors: sitting still    Progression of symptoms: better   OCULOMOTOR EXAM:   Ocular Alignment: normal   Ocular ROM: No Limitations   Spontaneous Nystagmus: absent   Gaze-Induced Nystagmus: absent   Smooth Pursuits: intact   Saccades: intact and ~2 saccadic beats in superior vertical direction, no dizziness     VESTIBULAR - OCULAR REFLEX:  Slow VOR: Normal - very slightly weird/dizzy    VOR Cancellation: Normal - feels a little slightly imbalanced    Head-Impulse Test: HIT Right: positive HIT Left: positive Incr dizziness afterwards and imbalance, worse on R side compared to L   Positional testing:  -R roll: - -L roll: - -R sidelying: -  -L sidelying: no nystagmus, but endorses dizziness   MOTION SENSITIVITY:    Motion Sensitivity Quotient  Intensity: 0 = none, 1 = Lightheaded, 2 = Mild, 3 = Moderate, 4 = Severe, 5 = Vomiting  Intensity  1. Sitting to supine 0  2. Supine to L side 0  3. Supine to R side 0  4. Supine to sitting 1  5. L sidelying 2  6. Up from L  2  7. R sidelying 0  8. Up from R  0  9. Sitting, head  tipped to L knee 0  10. Head up from L  knee 0  11. Sitting, head  tipped to R knee 0  12. Head up from R  knee 0  13. Sitting head turns x5 2  14.Sitting head nods x5 1  15. In stance, 180  turn to L  0  16. In stance, 180  turn to R 0, no dizziness, just a little more challenged with coordination       M-CTSIB  Condition 1: Firm Surface, EO 30 Sec, Normal Sway  Condition 2: Firm Surface, EC 30 Sec, Normal Sway, felt unsteady   Condition 3: Foam Surface, EO 30 Sec, Normal Sway  Condition 4: Foam Surface, EC 30 Sec, Mild sway, felt rocky     NMR: Habituation: -brandt daroff L x4 reps with generally decreasing symptoms  -positional testing (see above)  -DHI: 38/100 -VOR x1 seated horizontal/vertical busy background -VOR cancellation horizontal/vertical busy background (more  symptomatic with vertical)   PATIENT EDUCATION: Education details: added to HEP, exam findings Person educated: Patient Education method: Programmer, multimedia, Demonstration, Verbal cues, and Handouts Education comprehension: verbalized understanding, returned demonstration, and needs further education  HOME EXERCISE PROGRAM: Seated VOR 30 seconds - horizontal and vertical  Sit to Side-Lying    Sit on edge of bed. 1. Turn head 45 to right. 2. Maintain head position and lie down slowly on left side. Hold until symptoms subside. 3. Sit up slowly. Hold until symptoms subside. 4. Turn head 45 to left. 5. Maintain head position and lie down slowly on right side. Hold until symptoms subside. 6. Sit up slowly. Repeat sequence ____ times per session. Do ____ sessions per day. Symptoms should get better or stay the same  Access Code: [email or web address] URL: [email or web address] Date: 04/12/2024 Prepared by: Delon Pop  Exercises - Seated VOR Cancellation  - 1 x daily - 7 x weekly - 3 sets - 30s hold  GOALS: Goals reviewed with patient? Yes  SHORT TERM GOALS: Target date: 04/14/2024   Pt will be independent with initial HEP for improved strength, balance, transfers and gait. Baseline: Goal status: INITIAL  2.  Pt will improve 5 x STS to less than or equal to 20 seconds to demonstrate improved functional strength and transfer efficiency.  Baseline: 23 sec no UE (9/17) Goal status: INITIAL  3.  Vestibular system to be assessed and STG set as appropriate Baseline: LTG set Goal status: DEFERRED   LONG TERM GOALS: Target date: 05/05/2024   Pt will be independent with final HEP for improved strength, balance, transfers and gait. Baseline:  Goal status: INITIAL  2.  Pt will improve 5 x STS to less than or equal to 17 seconds to demonstrate improved functional strength and transfer efficiency.  Baseline: 23 sec no UE (9/17) Goal status: INITIAL  3.  Patient will improve DHI  score to </= 26 to demonstrate reduced symptoms  Baseline: 38 Goal status: REVISED  4.  Pt will improve FGA to 26/30 for decreased fall risk  Baseline: 22/30 (9/17) Goal status: INITIAL    ASSESSMENT:  CLINICAL IMPRESSION: Patient seen for skilled PT session with emphasis on completing vestibular assessment and expanding vestibular HEP. Positional testing, while symptomatic, was negative ruling out BPPV as contributing cause. She responded fairly well to brandt daroff habituation exercises and would benefit from further work on this. Her dizziness is more than likely central in origin given dx of MS, but further work on habituation and balance is warranted. Continue POC.   OBJECTIVE IMPAIRMENTS: decreased activity tolerance, decreased balance, decreased cognition, decreased coordination, decreased endurance, decreased knowledge of condition, decreased mobility, difficulty walking, decreased strength, impaired perceived functional ability, increased muscle spasms, impaired sensation, impaired UE functional use, impaired vision/preception, and pain.   ACTIVITY LIMITATIONS: carrying, lifting, bending, and squatting  PARTICIPATION LIMITATIONS: meal prep and cleaning  PERSONAL FACTORS: Sex and 1-2 comorbidities:  depression, allodynia, vertigo, MS (diagnosed 03/2023), HLD, prediabetes, HTNare also affecting patient's functional outcome.   REHAB POTENTIAL: Good  CLINICAL DECISION MAKING: Evolving/moderate complexity  EVALUATION COMPLEXITY: Moderate  PLAN:  PT FREQUENCY: 1x/week  PT DURATION: 6 weeks  PLANNED INTERVENTIONS: 97164- PT Re-evaluation, 97750- Physical Performance Testing, 97110-Therapeutic exercises, 97530- Therapeutic activity, [id number]- Neuromuscular re-education, 97535- Self Care, [id number]- Manual therapy, [id number]- Gait training, [phone]- Orthotic Initial, [phone]- Orthotic/Prosthetic subsequent, [phone]- Aquatic Therapy, [phone]- Electrical stimulation (manual), Patient/Family education,  Balance training, Stair training, Taping, Joint mobilization, Spinal mobilization, Vestibular training, Visual/preceptual remediation/compensation, Cognitive remediation, DME instructions, Cryotherapy, and Moist heat  PLAN FOR NEXT SESSION: STG assessment, VOR cancellation, habituation, standing balance NBOS, EC, head turns etc   assess R knee pain further PRN   Delon DELENA Pop, PT, DPT, CBIS 04/12/24 11:08 AM

## 2024-04-12 NOTE — Patient Instructions (Signed)

## 2024-04-19 ENCOUNTER — Ambulatory Visit

## 2024-04-19 VITALS — BP 120/81 | HR 90

## 2024-04-19 DIAGNOSIS — R41840 Attention and concentration deficit: Secondary | ICD-10-CM | POA: Diagnosis not present

## 2024-04-19 DIAGNOSIS — R278 Other lack of coordination: Secondary | ICD-10-CM

## 2024-04-19 DIAGNOSIS — R41842 Visuospatial deficit: Secondary | ICD-10-CM | POA: Diagnosis not present

## 2024-04-19 DIAGNOSIS — R2689 Other abnormalities of gait and mobility: Secondary | ICD-10-CM | POA: Diagnosis not present

## 2024-04-19 DIAGNOSIS — R42 Dizziness and giddiness: Secondary | ICD-10-CM

## 2024-04-19 DIAGNOSIS — R2681 Unsteadiness on feet: Secondary | ICD-10-CM

## 2024-04-19 DIAGNOSIS — M6281 Muscle weakness (generalized): Secondary | ICD-10-CM | POA: Diagnosis not present

## 2024-04-19 NOTE — Therapy (Signed)
 OUTPATIENT PHYSICAL THERAPY NEURO TREATMENT   Patient Name: Jamie Watson MRN: [id number] DOB:Nov 21, 1970, 53 y.o., female Today's Date: 04/19/2024   PCP: Thedora Garnette CHRISTELLA, MD REFERRING PROVIDER: Vear Charlie LABOR, MD  END OF SESSION:  PT End of Session - 04/19/24 1018     Visit Number 4    Number of Visits 7    Date for Recertification  05/19/24    Authorization Type [facility] [employer]    PT Start Time 1019   patient late   PT Stop Time 1058    PT Time Calculation (min) 39 min    Equipment Utilized During Treatment Gait belt    Activity Tolerance Patient tolerated treatment well    Behavior During Therapy [facility] for tasks assessed/performed          Past Medical History:  Diagnosis Date   Acute pain of left knee 04/19/2019   Chronic headaches    Fatigue    Hypertension    Irregular heart beat    Menopause    Migraine    Multiple sclerosis    Right arm pain 04/19/2019   Seasonal allergies    Snoring    Weight gain    Past Surgical History:  Procedure Laterality Date   TONSILLECTOMY AND ADENOIDECTOMY     Patient Active Problem List   Diagnosis Date Noted   Primary insomnia 12/02/2023   Dyshidrotic eczema 12/02/2023   Gait disturbance 08/13/2023   Depression, major, single episode, moderate (HCC) 07/18/2023   Allodynia 07/18/2023   Vertigo 04/17/2023   High risk medication use 04/08/2023   Vitamin D  deficiency 04/08/2023   Weakness 03/27/2023   MS (multiple sclerosis) 03/27/2023   Hypokalemia 03/27/2023   Obesity (BMI 30-39.9) 03/27/2023   Borderline hyperlipidemia 01/31/2022   Prediabetes 01/31/2022   Essential hypertension 01/28/2022   Marijuana use, continuous 01/28/2022    ONSET DATE: 02/25/2024  REFERRING DIAG: G35 (ICD-10-CM) - Multiple sclerosis exacerbation (HCC) R26.9 (ICD-10-CM) - Gait disturbance R20.8 (ICD-10-CM) - Dysesthesia  THERAPY DIAG:  Muscle weakness (generalized)  Other lack of coordination  Unsteadiness on feet  Dizziness and  giddiness  Other abnormalities of gait and mobility  Rationale for Evaluation and Treatment: Rehabilitation  SUBJECTIVE:                                                                                                                                                                                             SUBJECTIVE STATEMENT: Patient arrives to clinic, no AD. Did report extreme arm pain recently, but was relieved by rx. This is due to her job. Dizziness has been mild. This past Saturday she did  experience some and it prevented her from getting out of the house. Denies falls.   Pt accompanied by: self  PERTINENT HISTORY: PMH: depression, allodynia, vertigo, MS (diagnosed 03/2023), HLD, prediabetes, HTN  Per Dr. Vear note 02/25/2024 HISTORY OF PRESENT ILLNESS:  Jamie Watson is a 53 y.o. woman with multiple sclerosis.   Update 02/25/2024 She started Ocrevus  in November 2024 and tolerated the infusions well.  Next infusion November 2025.  No new neurologic symptoms.   She is not reporting a wearing off effect.   She has no systemic side effects with the infusions.   She is reporting bilateral arm pain.  On the right she reports the pain is near the elbow.   On the left, pain is more tingling dysesthesia , arm > leg   She saw Dr. Jerri of orthopedics who also did x-rays of her right elbow and shoulder.  No etiology of her pain was discovered    Currently, balance is still a little off and she use the bannister on stairs.   Leg strength improved.  Her riht knee buckles at times, especially down stairs, due to pain.    She has had some knee pain off/on in past since an injury 3-4 years ago - but seems worse recently.     Her left arm numbness has changed to a mild dysesthesia with allodynia.    Leg sensation is altere on the left as well.  The dysestheias feel like ice, sometimes uncomfortable.     Grip and writing improved.       She notes urinary urgency and some incontinence, ac ouple times a  week now   She notes reduced left vision the past several years, even before any other neurologic symptom.   She notes more fatigue - always better for a week after the infusion but more of a problem th rest of the time.   She has had some anxiety.    She has irritability and mild depression.   She has insomnia and has a lot of thoughts running through her head.  She feels memory and attention are a little worse.  PAIN:  Are you having pain? No  PRECAUTIONS: None   PATIENT GOALS: get some type of regimen, she used to do chair yoga, getting a routine down for exercise  OBJECTIVE:  Note: Objective measures were completed at Evaluation unless otherwise noted.   VITALS Vitals:   04/19/24 1026  BP: 120/81  Pulse: 90                                                                                                                                    TREATMENT DATE: 04/08/24  Therapeutic Activity:   Vitals:   04/19/24 1026  BP: 120/81  Pulse: 90     Assessed BP and WNL   VESTIBULAR ASSESSMENT   GENERAL OBSERVATION: Ambulates in with no AD independently  SYMPTOM BEHAVIOR:   Subjective history: Pt reports her dizziness has been going on since before her MS diagnosis, its not as intense. A couple days ago, did not feel steady on her feet and had to make herself sit down. Dizziness will come randomly and catch her off guard. Does not feel like she will faint, but feels like she might need to slow down a bit.    Non-Vestibular symptoms: changes in vision and blurred vision, saw eye doctor recently and was diagnosed with Keratoconus that can be contributing to her blurred vision    Type of dizziness: Lightheadedness/Faint and feeling a little weird, some slight spinning    Frequency: A day    Duration: 10+ hours, is an all day thing    Aggravating factors: Induced by motion: turning body quickly, turning head quickly, and quick movements, getting up out of bed    Relieving  factors: sitting still    Progression of symptoms: better   OCULOMOTOR EXAM:   Ocular Alignment: normal   Ocular ROM: No Limitations   Spontaneous Nystagmus: absent   Gaze-Induced Nystagmus: absent   Smooth Pursuits: intact   Saccades: intact and ~2 saccadic beats in superior vertical direction, no dizziness     VESTIBULAR - OCULAR REFLEX:    Slow VOR: Normal - very slightly weird/dizzy    VOR Cancellation: Normal - feels a little slightly imbalanced    Head-Impulse Test: HIT Right: positive HIT Left: positive Incr dizziness afterwards and imbalance, worse on R side compared to L   Positional testing:  -R roll: - -L roll: - -R sidelying: -  -L sidelying: no nystagmus, but endorses dizziness   MOTION SENSITIVITY:    Motion Sensitivity Quotient  Intensity: 0 = none, 1 = Lightheaded, 2 = Mild, 3 = Moderate, 4 = Severe, 5 = Vomiting  Intensity  1. Sitting to supine 0  2. Supine to L side 0  3. Supine to R side 0  4. Supine to sitting 1  5. L sidelying 2  6. Up from L  2  7. R sidelying 0  8. Up from R  0  9. Sitting, head  tipped to L knee 0  10. Head up from L  knee 0  11. Sitting, head  tipped to R knee 0  12. Head up from R  knee 0  13. Sitting head turns x5 2  14.Sitting head nods x5 1  15. In stance, 180  turn to L  0  16. In stance, 180  turn to R 0, no dizziness, just a little more challenged with coordination       M-CTSIB  Condition 1: Firm Surface, EO 30 Sec, Normal Sway  Condition 2: Firm Surface, EC 30 Sec, Normal Sway, felt unsteady   Condition 3: Foam Surface, EO 30 Sec, Normal Sway  Condition 4: Foam Surface, EC 30 Sec, Mild sway, felt rocky     NMR: DHI: 46/100; moderate handicap  MSIS-29:   [facility] PT Assessment - 04/19/24 0001       Standardized Balance Assessment   Five times sit to stand comments  15.31s with no UE         -standing on foam VOR x1 with pattern background   -horizontal (felt LOB anteriorly)   -vertical (felt  LOB posteriorly) -standing on foam horizontal head turns playing Spot It!   -progressed to placing cards on pattern papers with noted increased challenge finding matching symbols  -Surge deadlift x8  PATIENT EDUCATION: Education details: exam findings Person educated: Patient Education method: Programmer, multimedia, Demonstration, Verbal cues, and Handouts Education comprehension: verbalized understanding, returned demonstration, and needs further education  HOME EXERCISE PROGRAM: Seated VOR 30 seconds - horizontal and vertical  Sit to Side-Lying    Sit on edge of bed. 1. Turn head 45 to right. 2. Maintain head position and lie down slowly on left side. Hold until symptoms subside. 3. Sit up slowly. Hold until symptoms subside. 4. Turn head 45 to left. 5. Maintain head position and lie down slowly on right side. Hold until symptoms subside. 6. Sit up slowly. Repeat sequence ____ times per session. Do ____ sessions per day. Symptoms should get better or stay the same  Access Code: [email or web address] URL: [email or web address] Date: 04/12/2024 Prepared by: Delon Pop  Exercises - Seated VOR Cancellation  - 1 x daily - 7 x weekly - 3 sets - 30s hold  GOALS: Goals reviewed with patient? Yes  SHORT TERM GOALS: Target date: 04/14/2024   Pt will be independent with initial HEP for improved strength, balance, transfers and gait. Baseline:provided Goal status: MET  2.  Pt will improve 5 x STS to less than or equal to 20 seconds to demonstrate improved functional strength and transfer efficiency.  Baseline: 23 sec no UE (9/17); 15.31s Goal status: MET  3.  Vestibular system to be assessed and STG set as appropriate Baseline: LTG set Goal status: DEFERRED   LONG TERM GOALS: Target date: 05/05/2024   Pt will be independent with final HEP for improved strength, balance, transfers and gait. Baseline:  Goal status: INITIAL  2.  Pt will improve 5 x STS to less than or equal to  13 seconds to demonstrate improved functional strength and transfer efficiency.  Baseline: 23 sec no UE (9/17) Goal status: UPGRADED  3.  Patient will improve DHI score to </= 26 to demonstrate reduced symptoms  Baseline: 38 Goal status: REVISED  4.  Pt will improve FGA to 26/30 for decreased fall risk  Baseline: 22/30 (9/17) Goal status: INITIAL    ASSESSMENT:  CLINICAL IMPRESSION: Patient seen for skilled PT session with emphasis on STG assessment and progressing vestibular challenges. She met 2/2 remaining STG. Five times Sit to Stand Test (FTSS) Method: Use a straight back chair with a solid seat that is 17-18" high. Ask participant to sit on the chair with arms folded across their chest.   Instructions: "Stand up and sit down as quickly as possible 5 times, keeping your arms folded across your chest."   Measurement: Stop timing when the participant touches the chair in sitting the 5th time.  TIME: 15.31 sec  Cut off scores indicative of increased fall risk: >12 sec CVA, >16 sec PD, >13 sec vestibular (ANPTA Core Set of Outcome Measures for Adults with Neurologic Conditions, 2018). Mild LOB with horizontal > vertical head turns. Appropriate ankle strategy utilized on compliant surface with more complex visual tasks. Continue POC.    OBJECTIVE IMPAIRMENTS: decreased activity tolerance, decreased balance, decreased cognition, decreased coordination, decreased endurance, decreased knowledge of condition, decreased mobility, difficulty walking, decreased strength, impaired perceived functional ability, increased muscle spasms, impaired sensation, impaired UE functional use, impaired vision/preception, and pain.   ACTIVITY LIMITATIONS: carrying, lifting, bending, and squatting  PARTICIPATION LIMITATIONS: meal prep and cleaning  PERSONAL FACTORS: Sex and 1-2 comorbidities:  depression, allodynia, vertigo, MS (diagnosed 03/2023), HLD, prediabetes, HTNare also affecting patient's  functional outcome.   REHAB POTENTIAL: Good  CLINICAL DECISION MAKING: Evolving/moderate complexity  EVALUATION COMPLEXITY: Moderate  PLAN:  PT FREQUENCY: 1x/week  PT DURATION: 6 weeks  PLANNED INTERVENTIONS: 97164- PT Re-evaluation, 97750- Physical Performance Testing, 97110-Therapeutic exercises, 97530- Therapeutic activity, [id number]- Neuromuscular re-education, 97535- Self Care, [id number]- Manual therapy, [phone]- Gait training, [phone]- Orthotic Initial, [phone]- Orthotic/Prosthetic subsequent, [phone]- Aquatic Therapy, [phone]- Electrical stimulation (manual), Patient/Family education, Balance training, Stair training, Taping, Joint mobilization, Spinal mobilization, Vestibular training, Visual/preceptual remediation/compensation, Cognitive remediation, DME instructions, Cryotherapy, and Moist heat  PLAN FOR NEXT SESSION: VOR cancellation, habituation, standing balance NBOS, EC, head turns etc   assess R knee pain further PRN   Delon DELENA Pop, PT, DPT, CBIS 04/19/24 11:03 AM

## 2024-04-26 ENCOUNTER — Ambulatory Visit: Admitting: Physical Therapy

## 2024-04-26 ENCOUNTER — Ambulatory Visit: Admitting: Occupational Therapy

## 2024-05-03 ENCOUNTER — Encounter: Payer: Self-pay | Admitting: Physical Therapy

## 2024-05-03 ENCOUNTER — Ambulatory Visit: Admitting: Occupational Therapy

## 2024-05-03 ENCOUNTER — Ambulatory Visit: Admitting: Physical Therapy

## 2024-05-03 DIAGNOSIS — M6281 Muscle weakness (generalized): Secondary | ICD-10-CM

## 2024-05-03 DIAGNOSIS — R41842 Visuospatial deficit: Secondary | ICD-10-CM | POA: Diagnosis not present

## 2024-05-03 DIAGNOSIS — R278 Other lack of coordination: Secondary | ICD-10-CM

## 2024-05-03 DIAGNOSIS — R2689 Other abnormalities of gait and mobility: Secondary | ICD-10-CM

## 2024-05-03 DIAGNOSIS — R2681 Unsteadiness on feet: Secondary | ICD-10-CM

## 2024-05-03 DIAGNOSIS — R42 Dizziness and giddiness: Secondary | ICD-10-CM | POA: Diagnosis not present

## 2024-05-03 DIAGNOSIS — R41840 Attention and concentration deficit: Secondary | ICD-10-CM

## 2024-05-03 NOTE — Therapy (Addendum)
 OUTPATIENT OCCUPATIONAL THERAPY NEURO TREATMENT  Patient Name: Jamie Watson MRN: [id number] DOB:08-Dec-1970, 53 y.o., female Today's Date: 05/03/2024  PCP: Thedora Garnette CHRISTELLA, MD REFERRING PROVIDER: Dr. Vear  END OF SESSION:  OT End of Session - 05/03/24 0938     Visit Number 2    Number of Visits 8    Date for Recertification  06/06/24    Authorization Type [facility] [employer]    OT Start Time [id number]    OT Stop Time 1017    OT Time Calculation (min) 40 min    Activity Tolerance Patient tolerated treatment well;Other (comment)   Pt tearful regarding her job.   Behavior During Therapy [facility] for tasks assessed/performed          Past Medical History:  Diagnosis Date   Acute pain of left knee 04/19/2019   Chronic headaches    Fatigue    Hypertension    Irregular heart beat    Menopause    Migraine    Multiple sclerosis    Right arm pain 04/19/2019   Seasonal allergies    Snoring    Weight gain    Past Surgical History:  Procedure Laterality Date   TONSILLECTOMY AND ADENOIDECTOMY     Patient Active Problem List   Diagnosis Date Noted   Primary insomnia 12/02/2023   Dyshidrotic eczema 12/02/2023   Gait disturbance 08/13/2023   Depression, major, single episode, moderate (HCC) 07/18/2023   Allodynia 07/18/2023   Vertigo 04/17/2023   High risk medication use 04/08/2023   Vitamin D  deficiency 04/08/2023   Weakness 03/27/2023   Multiple sclerosis, relapsing-remitting 03/27/2023   Hypokalemia 03/27/2023   Obesity (BMI 30-39.9) 03/27/2023   Borderline hyperlipidemia 01/31/2022   Prediabetes 01/31/2022   Essential hypertension 01/28/2022   Marijuana use, continuous 01/28/2022    ONSET DATE: 03/24/24 (referral date)   REFERRING DIAG: G35 (ICD-10-CM) - Multiple sclerosis exacerbation R29.898 (ICD-10-CM) - Right hand weakness  THERAPY DIAG:  Muscle weakness (generalized)  Other lack of coordination  Attention and concentration deficit  Visuospatial  deficit  Unsteadiness on feet  Rationale for Evaluation and Treatment: Rehabilitation  SUBJECTIVE:   SUBJECTIVE STATEMENT: When reviewing goals regarding energy conservation, the patient became tearful. She explained that she does well conserving her energy at home; however, at work she feels overwhelmed with the demands placed on her. The patient reported that she has spoken with her manager and explained her condition related to multiple sclerosis. The manager informed the patient that if she is unable to complete her assigned job duties, she may need to seek another position. 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 The patient also reported that she was out of work last week due to vertigo and fatigue.   Pt accompanied by: self  PERTINENT HISTORY:  PMH: depression, allodynia, vertigo, MS (diagnosed 03/2023), HLD, prediabetes, HTN  PRECAUTIONS: None  WEIGHT BEARING RESTRICTIONS: No  PAIN:  Are you having pain? BUE's 6/10,constant  Patient reports pain in right elbow, left thigh, and entire left arm. Describes sensation as a "tense, freezer burn feeling. Pain and discomfort are partially relieved with medication. Patient reports that wearing long sleeves and layering clothing, including gloves, helps manage the cold sensation on the left side of her body.  FALLS: Has patient fallen in last 6 months? No  LIVING ENVIRONMENT: Lives with: lives alone Lives in: 4th floor apt Stairs:  Yes: External: 40 steps; bilateral but cannot reach both Has following equipment at home: None   PLOF: Independent with gait, Independent with transfers, and takes more time with cooking, cleaning  PATIENT GOALS: improve strength in hands, decrease dropping  OBJECTIVE:  Note: Objective measures were completed at Evaluation unless otherwise noted.  HAND  DOMINANCE: Right  ADLs: Overall ADLs: Mod I  Transfers/ambulation related to ADLs: independent  Eating: mod I  Grooming: mod I  UB Dressing: mod I  LB Dressing: mod I  (difficulty w/ button on pants)  Toileting: mod I  Bathing: mod I  Tub Shower transfers: mod I - pt reports has improved  Equipment: none  IADLs: Shopping: occasional assist getting groceries into apt on 4th floor Light housekeeping: mod I  Meal Prep: mod I  Community mobility: driving but difficulty at night Medication management: mod I  Financial management: mod I  Handwriting: 100% legible  MOBILITY STATUS: Independent   ACTIVITY TOLERANCE: Activity tolerance: fatigues and requires rest breaks after approx 15 min. Per pt report  FUNCTIONAL OUTCOME MEASURES: Upper Extremity Functional Scale (UEFS): TBA   UPPER EXTREMITY ROM:  BUE AROM WFL's   UPPER EXTREMITY MMT:   BUE MMT grossly WFL's - slightly weaker LUE   HAND FUNCTION: Grip strength: Right: 54 lbs; Left: 43.4 lbs  COORDINATION: 9 Hole Peg test: Right: 23.89 sec; Left: 29.74 sec   SENSATION: Hypersensitive on L side to light touch, feels like freezer burn, had to wear long sleeves day and night  EDEMA: None during evaluation, but notes sometimes jewelry is tighter   COGNITION: Overall cognitive status: pt reports issues with memory, problem solving, thinking but does not interfere with bill paying, medications. Pt reports occasional difficulty w/ job tasks (especially processing)   VISION: Subjective report: I have blurred vision, and mostly double vision w/o my glasses Baseline vision: Wears glasses all the time Visual history: only relating to MS, also keratoconus  VISION ASSESSMENT: To be further assessed in functional context  Patient has difficulty with following activities due to following visual impairments: job duties, reading, etc  PERCEPTION: Not tested  PRAXIS: Not tested  OBSERVATIONS: pt w/ overall fatigue, altered  sensation, decreased endurance, vision deficits and mild cognitive deficits                                                                                                                             TREATMENT: - Self-care/home management completed for duration as noted below including:  Therapist reviewed goals in relation to the patient's plan of care, as this is the patient's first visit since her evaluation. When reviewing energy conservation strategies, the patient was able to recall that positioning is important for her , such as bending or sitting down when able, in order to complete tasks safely and avoid dizziness and fatigue. The patient also recalled the importance of focusing on objects in her room upon waking in the morning and when getting out  of bed to help manage vertigo. Additionally, she was able to recall the importance of planning activities throughout the week to avoid taking on too much in a single day.  When cued about other strategies such as pacing and prioritizing, the patient expressed difficulty doing so at work due to the physical demands, therefore patient was provided with information regarding Presenter, [employer] and Employee Health at her workplace to explore options for job task modifications and workplace accommodations to improve efficiency and reduce fatigue.  OT provided the patient with information about resources available through [employer] [employer] (her workplace) where she could seek guidance or assistance regarding reasonable job modifications to make her work tasks more manageable and energy efficient. Pt also encouraged to write down what she feels would be reasonable job modification considerations in preparation for her discussion with Tax Adviser.   In addition, the patient reported frequently dropping objects due to weakness and decreased sensation in her L UE. The therapist instructed the patient to use both hands when picking up objects, to  slide items across surfaces (e.g., sliding pots across the counter) instead of lifting, and to incorporate visual monitoring (e.g., looking down when handling objects) to improve safety and control during daily tasks.  Pt also received a memory strategies handout aimed to help with recall of daily tasks, routines, and important information related to work and home responsibilities. These strategies include;  "WARM" strategy. W= write it down A=  associate it R=  repeat it M=  make a mental picture Please see pt's instructions for further specific details of recommendations.  Discussed specific ideas re: memory notebook with tabs for different topics and using sticky notes at different places at home to write down notes or info to remember ie) bedside, kitchen table and living room .  PATIENT EDUCATION: Education details: reviewed energy conservation techniques and goals, WARM handout, human resources for job Person educated: Patient Education method: Programmer, Multimedia, Facilities Manager, Actor cues, Verbal cues, and Handouts Education comprehension: verbalized understanding and needs further education  HOME EXERCISE PROGRAM: 04/12/24: energy conservation techniques 05/03/2024: WARM strategies   GOALS: Goals reviewed with patient? Yes  SHORT TERM GOALS: Target date: 05/06/24  Independent with HEP for BUE's (ROM/endurance)  Baseline: Goal status: IN PROGRESS  2.  Independent with HEP for hand strength and coordination Baseline:  Goal status: IN PROGRESS  3.  Pt to verbalize understanding with energy conservation techniques Baseline: Able to recall 2/4 on 05-03-24 Goal status: IN PROGRESS  4.  Pt to verbalize understanding with vision HEP and some accommodations for job duties related to vision Baseline:  Goal status: IN PROGRESS   LONG TERM GOALS: Target date: 06/06/24  Pt to improve UEFI scale by 10% Baseline: TBA Goal status: IN PROGRESS  2.  Pt to verbalize understanding  with education re:  impaired sensory including desensitization Lt hand, less dropping and  and potential A/E to increase safety w/ cooking RT hand Baseline:  Goal status: IN PROGRESS  3.  Pt to report overall less dropping w/ items Rt hand Baseline:  Goal status:IN PROGRESS  4.  Improve grip strength Lt hand by 5 lbs  Baseline: 43 lbs (Rt = 54 lbs)  Goal status: IN PROGRESS  5.  Pt to verbalize understanding with memory compensatory strategies and ways to keep thinking skills sharp  Baseline:  Goal status: IN PROGRESS   ASSESSMENT:  CLINICAL IMPRESSION: Patient presents with left-sided weakness, pain, and memory and vision deficits secondary to multiple sclerosis.  She demonstrates good rehab potential, as evidenced by her ability to verbalize understanding of the importance of energy conservation and identify strategies to support memory recall. She will continue to benefit from skilled outpatient OT to improve functional independence and safety in ADLs and IADLs.   PERFORMANCE DEFICITS: in functional skills including ADLs, IADLs, coordination, dexterity, sensation, edema, strength, pain, Fine motor control, Gross motor control, mobility, body mechanics, endurance, decreased knowledge of precautions, decreased knowledge of use of DME, vision, and UE functional use, cognitive skills including attention, memory, and problem solving, and psychosocial skills including coping strategies.   IMPAIRMENTS: are limiting patient from IADLs, work, and leisure.   CO-MORBIDITIES: may have co-morbidities  that affects occupational performance. Patient will benefit from skilled OT to address above impairments and improve overall function.  REHAB POTENTIAL: Good   PLAN:  OT FREQUENCY: 1x/week  OT DURATION: 8 weeks  PLANNED INTERVENTIONS: 97535 self care/ADL training, 02889 therapeutic exercise, 97530 therapeutic activity, 97112 neuromuscular re-education, 97140 manual therapy, 97113 aquatic  therapy, 97018 paraffin, 02960 fluidotherapy, 97010 moist heat, 97129 Cognitive training (first 15 min), 02869 Cognitive training(each additional 15 min), passive range of motion, visual/perceptual remediation/compensation, energy conservation, coping strategies training, patient/family education, and DME and/or AE instructions  RECOMMENDED OTHER SERVICES: none at this time  CONSULTED AND AGREED WITH PLAN OF CARE: Patient  PLAN FOR NEXT SESSION:  Follow up regarding job Assess UEFI scale AND update LTG #1 Further assess vision Cane/ putty HEP / coordination handout Golf solitaire- memory  Mala Gibbard, Student-OT 05/03/2024, 10:59 AM

## 2024-05-03 NOTE — Patient Instructions (Addendum)

## 2024-05-03 NOTE — Therapy (Signed)
 OUTPATIENT PHYSICAL THERAPY NEURO TREATMENT   Patient Name: Jamie Watson MRN: [id number] DOB:[birth year], 53 y.o., female Today's Date: 05/03/2024   PCP: Thedora Garnette CHRISTELLA, MD REFERRING PROVIDER: Vear Charlie LABOR, MD  END OF SESSION:  PT End of Session - 05/03/24 1019     Visit Number 5    Number of Visits 7    Date for Recertification  05/19/24    Authorization Type [facility] [employer]    PT Start Time 1019   handoff from OT   PT Stop Time 1059    PT Time Calculation (min) 40 min    Equipment Utilized During Treatment Gait belt    Activity Tolerance Patient tolerated treatment well    Behavior During Therapy [facility] for tasks assessed/performed          Past Medical History:  Diagnosis Date   Acute pain of left knee 04/19/2019   Chronic headaches    Fatigue    Hypertension    Irregular heart beat    Menopause    Migraine    Multiple sclerosis    Right arm pain 04/19/2019   Seasonal allergies    Snoring    Weight gain    Past Surgical History:  Procedure Laterality Date   TONSILLECTOMY AND ADENOIDECTOMY     Patient Active Problem List   Diagnosis Date Noted   Primary insomnia 12/02/2023   Dyshidrotic eczema 12/02/2023   Gait disturbance 08/13/2023   Depression, major, single episode, moderate (HCC) 07/18/2023   Allodynia 07/18/2023   Vertigo 04/17/2023   High risk medication use 04/08/2023   Vitamin D  deficiency 04/08/2023   Weakness 03/27/2023   Multiple sclerosis, relapsing-remitting 03/27/2023   Hypokalemia 03/27/2023   Obesity (BMI 30-39.9) 03/27/2023   Borderline hyperlipidemia 01/31/2022   Prediabetes 01/31/2022   Essential hypertension 01/28/2022   Marijuana use, continuous 01/28/2022    ONSET DATE: 02/25/2024  REFERRING DIAG: G35 (ICD-10-CM) - Multiple sclerosis exacerbation (HCC) R26.9 (ICD-10-CM) - Gait disturbance R20.8 (ICD-10-CM) - Dysesthesia  THERAPY DIAG:  Muscle weakness (generalized)  Unsteadiness on feet  Dizziness and  giddiness  Other abnormalities of gait and mobility  Rationale for Evaluation and Treatment: Rehabilitation  SUBJECTIVE:                                                                                                                                                                                             SUBJECTIVE STATEMENT: Has been feeling more fatigued recently in the last week. Had a dizzy spell at work Tuesday of last week. Had an overwhelming feeling come over and had to sit down.  Notes dizzy spells are  not as frequent or intense. Still working on getting a routine together, haven't been super consistent. Notes she has a hard time being still.   Pt accompanied by: self  PERTINENT HISTORY: PMH: depression, allodynia, vertigo, MS (diagnosed 03/2023), HLD, prediabetes, HTN  Per Dr. Vear note 02/25/2024 HISTORY OF PRESENT ILLNESS:  Jamie Watson is a 53 y.o. woman with multiple sclerosis.   Update 02/25/2024 She started Ocrevus  in November 2024 and tolerated the infusions well.  Next infusion November 2025.  No new neurologic symptoms.   She is not reporting a wearing off effect.   She has no systemic side effects with the infusions.   She is reporting bilateral arm pain.  On the right she reports the pain is near the elbow.   On the left, pain is more tingling dysesthesia , arm > leg   She saw Dr. Jerri of orthopedics who also did x-rays of her right elbow and shoulder.  No etiology of her pain was discovered    Currently, balance is still a little off and she use the bannister on stairs.   Leg strength improved.  Her riht knee buckles at times, especially down stairs, due to pain.    She has had some knee pain off/on in past since an injury 3-4 years ago - but seems worse recently.     Her left arm numbness has changed to a mild dysesthesia with allodynia.    Leg sensation is altere on the left as well.  The dysestheias feel like ice, sometimes uncomfortable.     Grip and writing improved.        She notes urinary urgency and some incontinence, ac ouple times a week now   She notes reduced left vision the past several years, even before any other neurologic symptom.   She notes more fatigue - always better for a week after the infusion but more of a problem th rest of the time.   She has had some anxiety.    She has irritability and mild depression.   She has insomnia and has a lot of thoughts running through her head.  She feels memory and attention are a little worse.  PAIN:  Are you having pain? No  PRECAUTIONS: None   PATIENT GOALS: get some type of regimen, she used to do chair yoga, getting a routine down for exercise  OBJECTIVE:  Note: Objective measures were completed at Evaluation unless otherwise noted.   VITALS There were no vitals filed for this visit.   VESTIBULAR ASSESSMENT   GENERAL OBSERVATION: Ambulates in with no AD independently     SYMPTOM BEHAVIOR:   Subjective history: Pt reports her dizziness has been going on since before her MS diagnosis, its not as intense. A couple days ago, did not feel steady on her feet and had to make herself sit down. Dizziness will come randomly and catch her off guard. Does not feel like she will faint, but feels like she might need to slow down a bit.    Non-Vestibular symptoms: changes in vision and blurred vision, saw eye doctor recently and was diagnosed with Keratoconus that can be contributing to her blurred vision    Type of dizziness: Lightheadedness/Faint and feeling a little weird, some slight spinning    Frequency: A day    Duration: 10+ hours, is an all day thing    Aggravating factors: Induced by motion: turning body quickly, turning head quickly, and quick movements, getting up out of bed  Relieving factors: sitting still    Progression of symptoms: better   OCULOMOTOR EXAM:   Ocular Alignment: normal   Ocular ROM: No Limitations   Spontaneous Nystagmus: absent   Gaze-Induced Nystagmus:  absent   Smooth Pursuits: intact   Saccades: intact and ~2 saccadic beats in superior vertical direction, no dizziness     VESTIBULAR - OCULAR REFLEX:    Slow VOR: Normal - very slightly weird/dizzy    VOR Cancellation: Normal - feels a little slightly imbalanced    Head-Impulse Test: HIT Right: positive HIT Left: positive Incr dizziness afterwards and imbalance, worse on R side compared to L   Positional testing:  -R roll: - -L roll: - -R sidelying: -  -L sidelying: no nystagmus, but endorses dizziness   MOTION SENSITIVITY:    Motion Sensitivity Quotient  Intensity: 0 = none, 1 = Lightheaded, 2 = Mild, 3 = Moderate, 4 = Severe, 5 = Vomiting  Intensity  1. Sitting to supine 0  2. Supine to L side 0  3. Supine to R side 0  4. Supine to sitting 1  5. L sidelying 2  6. Up from L  2  7. R sidelying 0  8. Up from R  0  9. Sitting, head  tipped to L knee 0  10. Head up from L  knee 0  11. Sitting, head  tipped to R knee 0  12. Head up from R  knee 0  13. Sitting head turns x5 2  14.Sitting head nods x5 1  15. In stance, 180  turn to L  0  16. In stance, 180  turn to R 0, no dizziness, just a little more challenged with coordination       M-CTSIB  Condition 1: Firm Surface, EO 30 Sec, Normal Sway  Condition 2: Firm Surface, EC 30 Sec, Normal Sway, felt unsteady   Condition 3: Foam Surface, EO 30 Sec, Normal Sway  Condition 4: Foam Surface, EC 30 Sec, Mild sway, felt rocky                                                                                                                                TREATMENT DATE: 05/03/24  Therapeutic Activity:  Reviewed energy conservation techniques with MS, pacing herself during daily tasks.   NMR: On air ex: 3/4 tandem stance with 2 sets of 10 reps ball toss with PT for visual scanning, coordination, pt needing intermittent taps to wall for balance  EC static stance 30 seconds  EC Feet hip width > more narrow BOS 2 x 10  reps head turns, 2 x 10 reps head nods Gait with head turns 2 x 30', gait with head nods 2 x 30', pt more unsteady with head turns, CGA for balance   Gaze Adaptation: x1 Viewing Horizontal: Position: Standing, Time: 30 seconds, Reps: 2, and Comment: pt reporting some queasiness after the 2nd rep when incr speed  x1 Viewing Vertical:  Position: Standing, Time: 30 seconds, Reps: 1, and Comment: no sx   Alternating forward stepping with head turns to R/L 10 reps, then progressed to standing on air ex for an additional 10 reps, pt needing to hold on to chair for balance   PATIENT EDUCATION: Education details: additions to HEP for balance, progressing VOR x1 to standing  Person educated: Patient Education method: Programmer, Multimedia, Demonstration, Verbal cues, and Handouts Education comprehension: verbalized understanding, returned demonstration, and needs further education  HOME EXERCISE PROGRAM: Standing VOR 30 seconds - horizontal and vertical (upgraded on 10/27)  Sit to Side-Lying    Sit on edge of bed. 1. Turn head 45 to right. 2. Maintain head position and lie down slowly on left side. Hold until symptoms subside. 3. Sit up slowly. Hold until symptoms subside. 4. Turn head 45 to left. 5. Maintain head position and lie down slowly on right side. Hold until symptoms subside. 6. Sit up slowly. Repeat sequence ____ times per session. Do ____ sessions per day. Symptoms should get better or stay the same  Access Code: [email or web address] URL: [email or web address] Date: 05/03/2024 Prepared by: Sheffield Senate  Exercises - Seated VOR Cancellation  - 1 x daily - 7 x weekly - 3 sets - 30s hold - Walking with Head Rotation  - 1 x daily - 5 x weekly - 3 sets - Standing Balance with Eyes Closed on Foam  - 1-2 x daily - 5 x weekly - 2 sets - 10 reps  GOALS: Goals reviewed with patient? Yes  SHORT TERM GOALS: Target date: 04/14/2024   Pt will be independent with initial HEP for improved  strength, balance, transfers and gait. Baseline:provided Goal status: MET  2.  Pt will improve 5 x STS to less than or equal to 20 seconds to demonstrate improved functional strength and transfer efficiency.  Baseline: 23 sec no UE (9/17); 15.31s Goal status: MET  3.  Vestibular system to be assessed and STG set as appropriate Baseline: LTG set Goal status: DEFERRED   LONG TERM GOALS: Target date: 05/05/2024   Pt will be independent with final HEP for improved strength, balance, transfers and gait. Baseline:  Goal status: INITIAL  2.  Pt will improve 5 x STS to less than or equal to 13 seconds to demonstrate improved functional strength and transfer efficiency.  Baseline: 23 sec no UE (9/17) Goal status: UPGRADED  3.  Patient will improve DHI score to </= 26 to demonstrate reduced symptoms  Baseline: 38 Goal status: REVISED  4.  Pt will improve FGA to 26/30 for decreased fall risk  Baseline: 22/30 (9/17) Goal status: INITIAL    ASSESSMENT:  CLINICAL IMPRESSION: Today's skilled session focused on progressing balance tasks in standing for vestibular input and progressing VOR. Pt challenged more for balance with head turns > head nods, esp during gait. Added to pt's HEP. Pt able to tolerate standing VOR x1 with cues to gradually incr the speed of movement. Pt has reported her dizzy spells are less intense since starting therapy. Will continue per POC.   OBJECTIVE IMPAIRMENTS: decreased activity tolerance, decreased balance, decreased cognition, decreased coordination, decreased endurance, decreased knowledge of condition, decreased mobility, difficulty walking, decreased strength, impaired perceived functional ability, increased muscle spasms, impaired sensation, impaired UE functional use, impaired vision/preception, and pain.   ACTIVITY LIMITATIONS: carrying, lifting, bending, and squatting  PARTICIPATION LIMITATIONS: meal prep and cleaning  PERSONAL FACTORS: Sex and 1-2  comorbidities:  depression, allodynia, vertigo, MS (diagnosed 03/2023),  HLD, prediabetes, HTNare also affecting patient's functional outcome.   REHAB POTENTIAL: Good  CLINICAL DECISION MAKING: Evolving/moderate complexity  EVALUATION COMPLEXITY: Moderate  PLAN:  PT FREQUENCY: 1x/week  PT DURATION: 6 weeks  PLANNED INTERVENTIONS: 97164- PT Re-evaluation, 97750- Physical Performance Testing, 97110-Therapeutic exercises, 97530- Therapeutic activity, [id number]- Neuromuscular re-education, 97535- Self Care, [id number]- Manual therapy, [phone]- Gait training, [phone]- Orthotic Initial, [phone]- Orthotic/Prosthetic subsequent, [phone]- Aquatic Therapy, [phone]- Electrical stimulation (manual), Patient/Family education, Balance training, Stair training, Taping, Joint mobilization, Spinal mobilization, Vestibular training, Visual/preceptual remediation/compensation, Cognitive remediation, DME instructions, Cryotherapy, and Moist heat  PLAN FOR NEXT SESSION: CHECK LTGS, determine re-cert or D/C?   VOR cancellation, habituation, standing balance NBOS, EC, head turns etc   assess R knee pain further PRN   Mechel Schutter, PT, DPT 05/03/24 1:15 PM

## 2024-05-03 NOTE — Patient Instructions (Signed)
 Gaze Stabilization: Standing Feet Apart    Feet shoulder width apart, keeping eyes on target on wall a few feet away, tilt head down 15-30 and move head side to side for __30__ seconds. Repeat while moving head up and down for _30___ seconds. Perform 2-3 sets of each, try to gradually incr speed  Do __2__ sessions per day.   Copyright  V[employer]. All rights reserved.

## 2024-05-10 ENCOUNTER — Encounter: Payer: Self-pay | Admitting: Physical Therapy

## 2024-05-10 ENCOUNTER — Ambulatory Visit: Admitting: Occupational Therapy

## 2024-05-10 ENCOUNTER — Encounter: Payer: Self-pay | Admitting: Radiology

## 2024-05-10 ENCOUNTER — Ambulatory Visit: Attending: Neurology | Admitting: Physical Therapy

## 2024-05-10 DIAGNOSIS — R42 Dizziness and giddiness: Secondary | ICD-10-CM | POA: Diagnosis not present

## 2024-05-10 DIAGNOSIS — R41840 Attention and concentration deficit: Secondary | ICD-10-CM | POA: Insufficient documentation

## 2024-05-10 DIAGNOSIS — M6281 Muscle weakness (generalized): Secondary | ICD-10-CM

## 2024-05-10 DIAGNOSIS — R41842 Visuospatial deficit: Secondary | ICD-10-CM | POA: Insufficient documentation

## 2024-05-10 DIAGNOSIS — R2681 Unsteadiness on feet: Secondary | ICD-10-CM | POA: Insufficient documentation

## 2024-05-10 DIAGNOSIS — R278 Other lack of coordination: Secondary | ICD-10-CM | POA: Diagnosis not present

## 2024-05-10 DIAGNOSIS — R2689 Other abnormalities of gait and mobility: Secondary | ICD-10-CM | POA: Insufficient documentation

## 2024-05-10 NOTE — Therapy (Addendum)
 OUTPATIENT OCCUPATIONAL THERAPY NEURO TREATMENT  Patient Name: Jamie Watson MRN: [id number] DOB:[birth year], 53 y.o., female Today's Date: 05/10/2024  PCP: Thedora Garnette CHRISTELLA, MD REFERRING PROVIDER: Dr. Vear  END OF SESSION:  OT End of Session - 05/10/24 1224     Visit Number 3    Number of Visits 8    Date for Recertification  06/06/24    Authorization Type [facility] [employer]    OT Start Time 0932    OT Stop Time 1018    OT Time Calculation (min) 46 min    Equipment Utilized During Treatment Red putty, coordination items    Activity Tolerance Patient tolerated treatment well    Behavior During Therapy [facility] for tasks assessed/performed           Past Medical History:  Diagnosis Date   Acute pain of left knee 04/19/2019   Chronic headaches    Fatigue    Hypertension    Irregular heart beat    Menopause    Migraine    Multiple sclerosis    Right arm pain 04/19/2019   Seasonal allergies    Snoring    Weight gain    Past Surgical History:  Procedure Laterality Date   TONSILLECTOMY AND ADENOIDECTOMY     Patient Active Problem List   Diagnosis Date Noted   Primary insomnia 12/02/2023   Dyshidrotic eczema 12/02/2023   Gait disturbance 08/13/2023   Depression, major, single episode, moderate (HCC) 07/18/2023   Allodynia 07/18/2023   Vertigo 04/17/2023   High risk medication use 04/08/2023   Vitamin D  deficiency 04/08/2023   Weakness 03/27/2023   Multiple sclerosis, relapsing-remitting 03/27/2023   Hypokalemia 03/27/2023   Obesity (BMI 30-39.9) 03/27/2023   Borderline hyperlipidemia 01/31/2022   Prediabetes 01/31/2022   Essential hypertension 01/28/2022   Marijuana use, continuous 01/28/2022    ONSET DATE: 03/24/24 (referral date)   REFERRING DIAG: G35 (ICD-10-CM) - Multiple sclerosis exacerbation R29.898 (ICD-10-CM) - Right hand weakness  THERAPY DIAG:  Muscle weakness (generalized)  Other lack of coordination  Attention and concentration  deficit  Visuospatial deficit  Unsteadiness on feet  Rationale for Evaluation and Treatment: Rehabilitation  SUBJECTIVE:   SUBJECTIVE STATEMENT: The patient reported having spoken with someone regarding follow-up for job demands and stated she has been referred to another professional for further assistance, with a meeting scheduled later this week.  Pt denied pain today but reported tingling in both arms. She noted that her left hand was numb for most of the day over the weekend. The patient also reported that her blood pressure was extremely high over the weekend and that she has been monitoring it at home, noting consistently elevated readings. She has not taken her blood pressure today.  Pt accompanied by: self  PERTINENT HISTORY:  PMH: depression, allodynia, vertigo, MS (diagnosed 03/2023), HLD, prediabetes, HTN  PRECAUTIONS: None  WEIGHT BEARING RESTRICTIONS: No  PAIN:  Are you having pain? BUE's 6/10,constant  Patient reports pain in right elbow, left thigh, and entire left arm. Describes sensation as a "tense, freezer burn feeling. Pain and discomfort are partially relieved with medication. Patient reports that wearing long sleeves and layering clothing, including gloves, helps manage the cold sensation on the left side of her body.  FALLS: Has patient fallen in last 6 months? No  LIVING ENVIRONMENT: Lives with: lives alone Lives in: 4th floor apt Stairs: Yes: External: 40 steps; bilateral but cannot reach both Has following equipment at home: None   PLOF: Independent with gait, Independent  with transfers, and takes more time with cooking, cleaning  PATIENT GOALS: improve strength in hands, decrease dropping  OBJECTIVE:  Note: Objective measures were completed at Evaluation unless otherwise noted.  HAND DOMINANCE: Right  ADLs: Overall ADLs: Mod I  Transfers/ambulation related to ADLs: independent  Eating: mod I  Grooming: mod I  UB Dressing: mod I  LB  Dressing: mod I  (difficulty w/ button on pants)  Toileting: mod I  Bathing: mod I  Tub Shower transfers: mod I - pt reports has improved  Equipment: none  IADLs: Shopping: occasional assist getting groceries into apt on 4th floor Light housekeeping: mod I  Meal Prep: mod I  Community mobility: driving but difficulty at night Medication management: mod I  Financial management: mod I  Handwriting: 100% legible  MOBILITY STATUS: Independent   ACTIVITY TOLERANCE: Activity tolerance: fatigues and requires rest breaks after approx 15 min. Per pt report  FUNCTIONAL OUTCOME MEASURES: Upper Extremity Functional Scale (UEFS): 52  UPPER EXTREMITY ROM:  BUE AROM WFL's   UPPER EXTREMITY MMT:   BUE MMT grossly WFL's - slightly weaker LUE   HAND FUNCTION: Grip strength: Right: 54 lbs; Left: 43.4 lbs  COORDINATION: 9 Hole Peg test: Right: 23.89 sec; Left: 29.74 sec   SENSATION: Hypersensitive on L side to light touch, feels like freezer burn, had to wear long sleeves day and night  EDEMA: None during evaluation, but notes sometimes jewelry is tighter   COGNITION: Overall cognitive status: pt reports issues with memory, problem solving, thinking but does not interfere with bill paying, medications. Pt reports occasional difficulty w/ job tasks (especially processing)   VISION: Subjective report: I have blurred vision, and mostly double vision w/o my glasses Baseline vision: Wears glasses all the time Visual history: only relating to MS, also keratoconus  VISION ASSESSMENT: To be further assessed in functional context  Patient has difficulty with following activities due to following visual impairments: job duties, reading, etc  PERCEPTION: Not tested  PRAXIS: Not tested  OBSERVATIONS: pt w/ overall fatigue, altered sensation, decreased endurance, vision deficits and mild cognitive deficits                                                                                                                              TREATMENT: - Self-care/home management completed for duration as noted below including:  At the start of the session, the patient completed the Upper Extremity Functional Index (UEFI) to assess areas of difficulty related to UE use. Please refer to the Objective section for specific details of scoring.   During the assessment, the patient reported experiencing increased fatigue when washing dishes. OT educated  encouraged pt to  use disposable products for cooking and eating to minimize dishwashing. The patient was also informed about adaptive equipment options that could facilitate task performance and help reduce fatigue during daily activities.  Additionally, pt has been working on initiating a binder to keep information and reminders for  memory support and organizational skills, which was initiated today by OT organizing information the patient has received thus far for tracking appointments, home exercise programs, and educational materials.  - Therapeutic activities completed for duration as noted below including:  Initiated Putty activities with red putty to begin strengthening, coordination and sensory stimulation of B UEs.  Patient provided visual demonstration, verbal and tactile cues as needed to improve performance of the various exercises/activities including:   - Putty Squeezes - cues to squeeze putty into log for use with other exercises and to fold putty in half with 1 hand  - Putty Rolls - encourage to roll putty into logs with sensory stimulation to entire length of hand, fingers and wrist as needed   - Pinch and Pull with Putty - this motion is combined with different pinches (3-Point Pinch, Tip Pinch, Key Pinch) - patient encouraged to combine tripod, pincer and/or key pinch with pinch and pull motion of putty pulling away from midline, changing between different pinches and changing different directions to change grip  - Finger  Extension with Putty - pt shown how to work on task with all fingers and thumb as well as individual fingers in opposition to thumb  - Finger Adduction with Putty - pt shown how to work on weaving putty between fingers/thumb and then squeeze fingers together while laying hand flat on table top.  - Removing Objects from Putty  - encouraged to hide items (coins, marble, dice etc) and use one hand at a time to find the objects and identify them by tactile input before s/he digs them out and can see them visually.  OT educated patient on theraputty recommendations: avoid small children, pets, hot environments, place in designated container and avoid contact with fabrics. Patient verbalized understanding.    Patient benefited from extra time, verbal/tactile cues, and modeling of task to allow time for processing of verbal instructions and improve motor planning of unfamiliar movements.   OT also initiated FM coordination HEP (handout provided, see pt instructions) - to improve B UE FM coordination, dexterity, proprioception. Pt returned demonstration of each activity: Picking up objects and placing in a container with slot Stacking towers of coins  Finger-to-palm then palm-to-finger translation of small items Shuffling cards Turning cards over 1 at a time Hold deck of cards in palm of hand and push off 1 card at a time from the top of the deck using only thumb Tear piece of tissue and rolling into small balls with fingertips Meditation balls - clockwise then counterclockwise Rolling pen between fingers and thumb, pen twirl (rotation), pen shift (translation)   In addition, OT educated pt on table top play of Golf Solitaire for RUE and LUE to address fine motor coordination, processing, and endurance/stamina. Pt required minimal cues for proper play.     PATIENT EDUCATION: Education details: Energy conservation for washing dishes, coordination, golf solitaire, red putty exercises Person  educated: Patient Education method: Explanation, Demonstration, Tactile cues, Verbal cues, and Handouts Education comprehension: verbalized understanding and needs further education  HOME EXERCISE PROGRAM: 04/12/24: energy conservation techniques 05/03/2024: WARM strategies,  05/10/2024: Coordination handout, golf solitaire, putty exercises- access code; [email or web address]   GOALS: Goals reviewed with patient? Yes  SHORT TERM GOALS: Target date: 05/06/24  Independent with HEP for BUE's (ROM/endurance)  Baseline: has received putty exercises 05/10/24 Goal status: IN PROGRESS  2.  Independent with HEP for hand strength and coordination Baseline:  Goal status: IN PROGRESS  3.  Pt to verbalize understanding  with energy conservation techniques Baseline: Able to recall 2/4 on 05-03-24 Goal status: IN PROGRESS  4.  Pt to verbalize understanding with vision HEP and some accommodations for job duties related to vision Baseline:  Goal status: IN PROGRESS   LONG TERM GOALS: Target date: 06/06/24  Pt to improve UEFI scale by 10% Baseline: Score 52- 05/10/24 Goal status: IN PROGRESS  2.  Pt to verbalize understanding with education re:  impaired sensory including desensitization Lt hand, less dropping and  and potential A/E to increase safety w/ cooking RT hand Baseline:  Goal status: IN PROGRESS  3.  Pt to report overall less dropping w/ items Rt hand Baseline:  Goal status:IN PROGRESS  4.  Improve grip strength Lt hand by 5 lbs  Baseline: 43 lbs (Rt = 54 lbs)  Goal status: IN PROGRESS  5.  Pt to verbalize understanding with memory compensatory strategies and ways to keep thinking skills sharp  Baseline:  Goal status: IN PROGRESS   ASSESSMENT:  CLINICAL IMPRESSION: Patient presents with left-sided weakness, pain, and memory and vision deficits secondary to multiple sclerosis. She demonstrates good rehab potential, as evidenced by her ability to verbalize understanding of the  importance of energy conservation and identify strategies to support memory recall. She will continue to benefit from skilled outpatient OT to improve functional independence and safety in ADLs and IADLs.   PERFORMANCE DEFICITS: in functional skills including ADLs, IADLs, coordination, dexterity, sensation, edema, strength, pain, Fine motor control, Gross motor control, mobility, body mechanics, endurance, decreased knowledge of precautions, decreased knowledge of use of DME, vision, and UE functional use, cognitive skills including attention, memory, and problem solving, and psychosocial skills including coping strategies.   IMPAIRMENTS: are limiting patient from IADLs, work, and leisure.   CO-MORBIDITIES: may have co-morbidities  that affects occupational performance. Patient will benefit from skilled OT to address above impairments and improve overall function.  REHAB POTENTIAL: Good   PLAN:  OT FREQUENCY: 1x/week  OT DURATION: 8 weeks  PLANNED INTERVENTIONS: 97535 self care/ADL training, 02889 therapeutic exercise, 97530 therapeutic activity, 97112 neuromuscular re-education, 97140 manual therapy, 97113 aquatic therapy, 97018 paraffin, 02960 fluidotherapy, 97010 moist heat, 97129 Cognitive training (first 15 min), 02869 Cognitive training(each additional 15 min), passive range of motion, visual/perceptual remediation/compensation, energy conservation, coping strategies training, patient/family education, and DME and/or AE instructions  RECOMMENDED OTHER SERVICES: none at this time  CONSULTED AND AGREED WITH PLAN OF CARE: Patient  PLAN FOR NEXT SESSION:  Follow up regarding job Functional task- washing dishes using EC strategies Further assess vision Memory activities   Donielle Radziewicz, Student-OT 05/10/2024, 1:44 PM

## 2024-05-10 NOTE — Patient Instructions (Addendum)
    Access Code: [email or web address] URL: [email or web address] Date: 05/10/2024 Prepared by: [email or web address] Malania Gawthrop  Exercises - Putty Squeezes  - 1-2 x daily - 10 reps - Rolling Putty on Table  - 1-2 x daily - 10 reps - Finger Pinch and Pull with Putty  - 1-2 x daily - 10 reps - 3-Point Pinch with Putty  - 1-2 x daily - 10 reps - Tip PUSH with Putty  - 1-2 x daily - 10 reps - Key Pinch with Putty  - 1-2 x daily - 10 reps - Finger Extension with Putty  - 1-2 x daily - 10 reps - Finger Adduction with Putty  - 1-2 x daily - 10 reps - Removing Marbles from Putty  - 1-2 x daily - 10 reps

## 2024-05-10 NOTE — Therapy (Signed)
 OUTPATIENT PHYSICAL THERAPY NEURO TREATMENT/DISCHARGE SUMMARY   Patient Name: Jamie Watson MRN: [id number] DOB:[birth year], 53 y.o., female Today's Date: 05/10/2024   PCP: Jamie Garnette CHRISTELLA, MD REFERRING PROVIDER: Vear Charlie LABOR, MD  PHYSICAL THERAPY DISCHARGE SUMMARY  Visits from Start of Care: 6  Current functional level related to goals / functional outcomes: See LTGs/Clinical Assessment Statement   Remaining deficits: Impaired high level balance, dizziness (central due to MS)   Education / Equipment: HEP, MS education and pacing strategies    Patient agrees to discharge. Patient goals were partially met. Patient is being discharged due to being pleased with the current functional level.  END OF SESSION:  PT End of Session - 05/10/24 1019     Visit Number 6    Number of Visits 7    Date for Recertification  05/19/24    Authorization Type [facility] [employer]    PT Start Time 1018    PT Stop Time 1046   full time not used due to D/C visit   PT Time Calculation (min) 28 min    Equipment Utilized During Treatment Gait belt    Activity Tolerance Patient tolerated treatment well    Behavior During Therapy WFL for tasks assessed/performed          Past Medical History:  Diagnosis Date   Acute pain of left knee 04/19/2019   Chronic headaches    Fatigue    Hypertension    Irregular heart beat    Menopause    Migraine    Multiple sclerosis    Right arm pain 04/19/2019   Seasonal allergies    Snoring    Weight gain    Past Surgical History:  Procedure Laterality Date   TONSILLECTOMY AND ADENOIDECTOMY     Patient Active Problem List   Diagnosis Date Noted   Primary insomnia 12/02/2023   Dyshidrotic eczema 12/02/2023   Gait disturbance 08/13/2023   Depression, major, single episode, moderate (HCC) 07/18/2023   Allodynia 07/18/2023   Vertigo 04/17/2023   High risk medication use 04/08/2023   Vitamin D  deficiency 04/08/2023   Weakness 03/27/2023   Multiple  sclerosis, relapsing-remitting 03/27/2023   Hypokalemia 03/27/2023   Obesity (BMI 30-39.9) 03/27/2023   Borderline hyperlipidemia 01/31/2022   Prediabetes 01/31/2022   Essential hypertension 01/28/2022   Marijuana use, continuous 01/28/2022    ONSET DATE: 02/25/2024  REFERRING DIAG: G35 (ICD-10-CM) - Multiple sclerosis exacerbation (HCC) R26.9 (ICD-10-CM) - Gait disturbance R20.8 (ICD-10-CM) - Dysesthesia  THERAPY DIAG:  Muscle weakness (generalized)  Unsteadiness on feet  Other abnormalities of gait and mobility  Dizziness and giddiness  Rationale for Evaluation and Treatment: Rehabilitation  SUBJECTIVE:  SUBJECTIVE STATEMENT: With the walking, found herself going to the R. Tried her exercises at home and noticed that she was holding her breath. No falls or dizziness episodes. Learning to pace herself. Trying to add the exercises into her routine.   Pt accompanied by: self  PERTINENT HISTORY: PMH: depression, allodynia, vertigo, MS (diagnosed 03/2023), HLD, prediabetes, HTN  Per Dr. Vear note 02/25/2024 HISTORY OF PRESENT ILLNESS:  Jamie Watson is a 53 y.o. woman with multiple sclerosis.   Update 02/25/2024 She started Ocrevus  in November 2024 and tolerated the infusions well.  Next infusion November 2025.  No new neurologic symptoms.   She is not reporting a wearing off effect.   She has no systemic side effects with the infusions.   She is reporting bilateral arm pain.  On the right she reports the pain is near the elbow.   On the left, pain is more tingling dysesthesia , arm > leg   She saw Dr. Jerri of orthopedics who also did x-rays of her right elbow and shoulder.  No etiology of her pain was discovered    Currently, balance is still a little off and she use the bannister on stairs.   Leg  strength improved.  Her riht knee buckles at times, especially down stairs, due to pain.    She has had some knee pain off/on in past since an injury 3-4 years ago - but seems worse recently.     Her left arm numbness has changed to a mild dysesthesia with allodynia.    Leg sensation is altere on the left as well.  The dysestheias feel like ice, sometimes uncomfortable.     Grip and writing improved.       She notes urinary urgency and some incontinence, ac ouple times a week now   She notes reduced left vision the past several years, even before any other neurologic symptom.   She notes more fatigue - always better for a week after the infusion but more of a problem th rest of the time.   She has had some anxiety.    She has irritability and mild depression.   She has insomnia and has a lot of thoughts running through her head.  She feels memory and attention are a little worse.  PAIN:  Are you having pain? Pt reports having some pain in her arms   PRECAUTIONS: None   PATIENT GOALS: get some type of regimen, she used to do chair yoga, getting a routine down for exercise  OBJECTIVE:  Note: Objective measures were completed at Evaluation unless otherwise noted.   VITALS There were no vitals filed for this visit.   VESTIBULAR ASSESSMENT   GENERAL OBSERVATION: Ambulates in with no AD independently     SYMPTOM BEHAVIOR:   Subjective history: Pt reports her dizziness has been going on since before her MS diagnosis, its not as intense. A couple days ago, did not feel steady on her feet and had to make herself sit down. Dizziness will come randomly and catch her off guard. Does not feel like she will faint, but feels like she might need to slow down a bit.    Non-Vestibular symptoms: changes in vision and blurred vision, saw eye doctor recently and was diagnosed with Keratoconus that can be contributing to her blurred vision    Type of dizziness: Lightheadedness/Faint and feeling a little  weird, some slight spinning    Frequency: A day    Duration: 10+ hours, is an  all day thing    Aggravating factors: Induced by motion: turning body quickly, turning head quickly, and quick movements, getting up out of bed    Relieving factors: sitting still    Progression of symptoms: better   OCULOMOTOR EXAM:   Ocular Alignment: normal   Ocular ROM: No Limitations   Spontaneous Nystagmus: absent   Gaze-Induced Nystagmus: absent   Smooth Pursuits: intact   Saccades: intact and ~2 saccadic beats in superior vertical direction, no dizziness     VESTIBULAR - OCULAR REFLEX:    Slow VOR: Normal - very slightly weird/dizzy    VOR Cancellation: Normal - feels a little slightly imbalanced    Head-Impulse Test: HIT Right: positive HIT Left: positive Incr dizziness afterwards and imbalance, worse on R side compared to L   Positional testing:  -R roll: - -L roll: - -R sidelying: -  -L sidelying: no nystagmus, but endorses dizziness   MOTION SENSITIVITY:    Motion Sensitivity Quotient  Intensity: 0 = none, 1 = Lightheaded, 2 = Mild, 3 = Moderate, 4 = Severe, 5 = Vomiting  Intensity  1. Sitting to supine 0  2. Supine to L side 0  3. Supine to R side 0  4. Supine to sitting 1  5. L sidelying 2  6. Up from L  2  7. R sidelying 0  8. Up from R  0  9. Sitting, head  tipped to L knee 0  10. Head up from L  knee 0  11. Sitting, head  tipped to R knee 0  12. Head up from R  knee 0  13. Sitting head turns x5 2  14.Sitting head nods x5 1  15. In stance, 180  turn to L  0  16. In stance, 180  turn to R 0, no dizziness, just a little more challenged with coordination       M-CTSIB  Condition 1: Firm Surface, EO 30 Sec, Normal Sway  Condition 2: Firm Surface, EC 30 Sec, Normal Sway, felt unsteady   Condition 3: Foam Surface, EO 30 Sec, Normal Sway  Condition 4: Foam Surface, EC 30 Sec, Mild sway, felt rocky                                                                                                                                 TREATMENT DATE: 05/10/24  Therapeutic Activity:  DHI: 52/100  = moderate handicap in regards to dizziness  5x sit <> stand: 16.5 seconds with no UE support   DVA: Static: Line 10 Dynamic: Line 6  Reports feeling a little bit more off   [facility] PT Assessment - 05/10/24 1030       Functional Gait  Assessment   Gait assessed  Yes    Gait Level Surface Walks 20 ft in less than 7 sec but greater than 5.5 sec, uses assistive device, slower speed, mild gait deviations, or deviates 6-10  in outside of the 12 in walkway width.   6.3   Change in Gait Speed Able to smoothly change walking speed without loss of balance or gait deviation. Deviate no more than 6 in outside of the 12 in walkway width.    Gait with Horizontal Head Turns Performs head turns smoothly with no change in gait. Deviates no more than 6 in outside 12 in walkway width    Gait with Vertical Head Turns Performs head turns with no change in gait. Deviates no more than 6 in outside 12 in walkway width.    Gait and Pivot Turn Pivot turns safely within 3 sec and stops quickly with no loss of balance.    Step Over Obstacle Is able to step over 2 stacked shoe boxes taped together (9 in total height) without changing gait speed. No evidence of imbalance.    Gait with Narrow Base of Support Is able to ambulate for 10 steps heel to toe with no staggering.    Gait with Eyes Closed Walks 20 ft, slow speed, abnormal gait pattern, evidence for imbalance, deviates 10-15 in outside 12 in walkway width. Requires more than 9 sec to ambulate 20 ft.   11.2 seconds   Ambulating Backwards Walks 20 ft, uses assistive device, slower speed, mild gait deviations, deviates 6-10 in outside 12 in walkway width.   19 seconds   Steps Alternating feet, no rail.    Total Score 26    FGA comment: 26/30 = Low Fall Risk         Discussed exercise program going forwards - pt planning on starting chair yoga  exercises online and also planning on walking more to get more in a routine  Provided education on goals, with pt in improvement with FGA and sit <> stands, pt reports that she would like to D/C at this time as she feels good with her exercises at home and feels improvement. Discussed if pt notices any changes in the future or wants to come back to work on her balance then she can get a new referral. Pt verbalized understanding    PATIENT EDUCATION: Education details: verbally reviewed HEP (pt with no further questions), results of goals, D/C from PT  Person educated: Patient Education method: Explanation, Demonstration, and Verbal cues Education comprehension: verbalized understanding, returned demonstration, and needs further education    HOME EXERCISE PROGRAM: Standing VOR 30 seconds - horizontal and vertical (upgraded on 10/27)  Sit to Side-Lying    Sit on edge of bed. 1. Turn head 45 to right. 2. Maintain head position and lie down slowly on left side. Hold until symptoms subside. 3. Sit up slowly. Hold until symptoms subside. 4. Turn head 45 to left. 5. Maintain head position and lie down slowly on right side. Hold until symptoms subside. 6. Sit up slowly. Repeat sequence ____ times per session. Do ____ sessions per day. Symptoms should get better or stay the same  Access Code: [email or web address] URL: [email or web address] Date: 05/03/2024 Prepared by: Sheffield Senate  Exercises - Seated VOR Cancellation  - 1 x daily - 7 x weekly - 3 sets - 30s hold - Walking with Head Rotation  - 1 x daily - 5 x weekly - 3 sets - Standing Balance with Eyes Closed on Foam  - 1-2 x daily - 5 x weekly - 2 sets - 10 reps   GOALS: Goals reviewed with patient? Yes  SHORT TERM GOALS: Target date: 04/14/2024   Pt will be independent with  initial HEP for improved strength, balance, transfers and gait. Baseline:provided Goal status: MET  2.  Pt will improve 5 x STS to less than or equal  to 20 seconds to demonstrate improved functional strength and transfer efficiency.  Baseline: 23 sec no UE (9/17); 15.31s Goal status: MET  3.  Vestibular system to be assessed and STG set as appropriate Baseline: LTG set Goal status: DEFERRED   LONG TERM GOALS: Target date: 05/05/2024   Pt will be independent with final HEP for improved strength, balance, transfers and gait. Baseline:  Goal status: MET  2.  Pt will improve 5 x STS to less than or equal to 13 seconds to demonstrate improved functional strength and transfer efficiency.  Baseline: 23 sec no UE (9/17)  16.5 seconds with no UE support (11/3) Goal status: NOT MET  3.  Patient will improve DHI score to </= 26 to demonstrate reduced symptoms  Baseline: 38  52 (11/3) Goal status: NOT MET  4.  Pt will improve FGA to 26/30 for decreased fall risk  Baseline: 22/30 (9/17)  26/30 (11/3) Goal status: MET     ASSESSMENT:  CLINICAL IMPRESSION: Today's skilled session focused on assessing pt's LTGs. Pt met LTGs #1 and #4 in regards to HEP and FGA. Pt improved FGA to a 26/30, indicating a low fall risk (previously was a medium fall risk). Pt improved 5x sit <> stand time from eval with no UE support, but did not quite meet LTG. Pt scored higher on DHI today, indicating more handicap in regards to dizziness, but pt reporting that overall her sx of dizziness have been better. Pt has been doing her HEP for balance at home and feels good with her exercises. Pt would like to D/C at this time from PT due to being pleased with current functional level and her improvements with her balance. Pt also planning on starting to walk more and doing chair yoga Youtube classes.   OBJECTIVE IMPAIRMENTS: decreased activity tolerance, decreased balance, decreased cognition, decreased coordination, decreased endurance, decreased knowledge of condition, decreased mobility, difficulty walking, decreased strength, impaired perceived functional  ability, increased muscle spasms, impaired sensation, impaired UE functional use, impaired vision/preception, and pain.   ACTIVITY LIMITATIONS: carrying, lifting, bending, and squatting  PARTICIPATION LIMITATIONS: meal prep and cleaning  PERSONAL FACTORS: Sex and 1-2 comorbidities:  depression, allodynia, vertigo, MS (diagnosed 03/2023), HLD, prediabetes, HTNare also affecting patient's functional outcome.   REHAB POTENTIAL: Good  CLINICAL DECISION MAKING: Evolving/moderate complexity  EVALUATION COMPLEXITY: Moderate  PLAN:  PT FREQUENCY: 1x/week  PT DURATION: 6 weeks  PLANNED INTERVENTIONS: 97164- PT Re-evaluation, 97750- Physical Performance Testing, 97110-Therapeutic exercises, 97530- Therapeutic activity, [id number]- Neuromuscular re-education, 97535- Self Care, [id number]- Manual therapy, [phone]- Gait training, [phone]- Orthotic Initial, [phone]- Orthotic/Prosthetic subsequent, [phone]- Aquatic Therapy, [phone]- Electrical stimulation (manual), Patient/Family education, Balance training, Stair training, Taping, Joint mobilization, Spinal mobilization, Vestibular training, Visual/preceptual remediation/compensation, Cognitive remediation, DME instructions, Cryotherapy, and Moist heat  PLAN FOR NEXT SESSION: D/C   Sheffield Senate, PT, DPT 05/10/24 10:52 AM

## 2024-05-17 ENCOUNTER — Ambulatory Visit: Admitting: Occupational Therapy

## 2024-05-17 ENCOUNTER — Encounter: Payer: Self-pay | Admitting: Neurology

## 2024-05-17 ENCOUNTER — Other Ambulatory Visit (HOSPITAL_COMMUNITY): Payer: Self-pay

## 2024-05-17 ENCOUNTER — Other Ambulatory Visit: Payer: Self-pay | Admitting: Neurology

## 2024-05-17 MED ORDER — METHYLPREDNISOLONE 4 MG PO TBPK
ORAL_TABLET | ORAL | 0 refills | Status: AC
  Filled 2024-05-17: qty 21, 6d supply, fill #0

## 2024-05-18 ENCOUNTER — Other Ambulatory Visit (HOSPITAL_COMMUNITY): Payer: Self-pay

## 2024-05-25 DIAGNOSIS — G35A Relapsing-remitting multiple sclerosis: Secondary | ICD-10-CM | POA: Diagnosis not present

## 2024-05-28 ENCOUNTER — Ambulatory Visit: Admitting: Occupational Therapy

## 2024-05-28 ENCOUNTER — Encounter: Payer: Self-pay | Admitting: Occupational Therapy

## 2024-05-28 DIAGNOSIS — R41842 Visuospatial deficit: Secondary | ICD-10-CM | POA: Diagnosis not present

## 2024-05-28 DIAGNOSIS — R41840 Attention and concentration deficit: Secondary | ICD-10-CM

## 2024-05-28 DIAGNOSIS — M6281 Muscle weakness (generalized): Secondary | ICD-10-CM

## 2024-05-28 DIAGNOSIS — R278 Other lack of coordination: Secondary | ICD-10-CM

## 2024-05-28 DIAGNOSIS — R2681 Unsteadiness on feet: Secondary | ICD-10-CM

## 2024-05-28 DIAGNOSIS — R42 Dizziness and giddiness: Secondary | ICD-10-CM | POA: Diagnosis not present

## 2024-05-28 DIAGNOSIS — R2689 Other abnormalities of gait and mobility: Secondary | ICD-10-CM | POA: Diagnosis not present

## 2024-05-28 NOTE — Therapy (Signed)
 OUTPATIENT OCCUPATIONAL THERAPY NEURO TREATMENT  Patient Name: Jamie Watson MRN: [id number] DOB:02/23/1971, 53 y.o., female Today's Date: 05/28/2024  PCP: Thedora Garnette CHRISTELLA, MD REFERRING PROVIDER: Dr. Vear  END OF SESSION:  OT End of Session - 05/28/24 0847     Visit Number 4    Number of Visits 8    Date for Recertification  06/06/24    Authorization Type [facility] [employer]    OT Start Time [phone]    OT Stop Time 0930    OT Time Calculation (min) 44 min    Equipment Utilized During Treatment Red putty, coordination items    Activity Tolerance Patient tolerated treatment well    Behavior During Therapy [facility] for tasks assessed/performed           Past Medical History:  Diagnosis Date   Acute pain of left knee 04/19/2019   Chronic headaches    Fatigue    Hypertension    Irregular heart beat    Menopause    Migraine    Multiple sclerosis    Right arm pain 04/19/2019   Seasonal allergies    Snoring    Weight gain    Past Surgical History:  Procedure Laterality Date   TONSILLECTOMY AND ADENOIDECTOMY     Patient Active Problem List   Diagnosis Date Noted   Primary insomnia 12/02/2023   Dyshidrotic eczema 12/02/2023   Gait disturbance 08/13/2023   Depression, major, single episode, moderate (HCC) 07/18/2023   Allodynia 07/18/2023   Vertigo 04/17/2023   High risk medication use 04/08/2023   Vitamin D  deficiency 04/08/2023   Weakness 03/27/2023   Multiple sclerosis, relapsing-remitting 03/27/2023   Hypokalemia 03/27/2023   Obesity (BMI 30-39.9) 03/27/2023   Borderline hyperlipidemia 01/31/2022   Prediabetes 01/31/2022   Essential hypertension 01/28/2022   Marijuana use, continuous 01/28/2022    ONSET DATE: 03/24/24 (referral date)   REFERRING DIAG: G35 (ICD-10-CM) - Multiple sclerosis exacerbation R29.898 (ICD-10-CM) - Right hand weakness  THERAPY DIAG:  Other lack of coordination  Muscle weakness (generalized)  Attention and concentration  deficit  Visuospatial deficit  Unsteadiness on feet  Rationale for Evaluation and Treatment: Rehabilitation  SUBJECTIVE:   SUBJECTIVE STATEMENT: I feel like my coordination is worse and I'm dropping more things. Pt reports only doing coin exercises, not card ex's from coordination HEP  Pain still the same everywhere but also Rt knee pain occasionally  Pt accompanied by: self  PERTINENT HISTORY:  PMH: depression, allodynia, vertigo, MS (diagnosed 03/2023), HLD, prediabetes, HTN  PRECAUTIONS: None  WEIGHT BEARING RESTRICTIONS: No  PAIN:  Are you having pain? BUE's 6/10,constant  Patient reports pain in right elbow, left thigh, and entire left arm. Describes sensation as a "tense, freezer burn feeling. Pain and discomfort are partially relieved with medication. Patient reports that wearing long sleeves and layering clothing, including gloves, helps manage the cold sensation on the left side of her body.  FALLS: Has patient fallen in last 6 months? No  LIVING ENVIRONMENT: Lives with: lives alone Lives in: 4th floor apt Stairs: Yes: External: 40 steps; bilateral but cannot reach both Has following equipment at home: None   PLOF: Independent with gait, Independent with transfers, and takes more time with cooking, cleaning  PATIENT GOALS: improve strength in hands, decrease dropping  OBJECTIVE:  Note: Objective measures were completed at Evaluation unless otherwise noted.  HAND DOMINANCE: Right  ADLs: Overall ADLs: Mod I  Transfers/ambulation related to ADLs: independent  Eating: mod I  Grooming: mod I  UB  Dressing: mod I  LB Dressing: mod I  (difficulty w/ button on pants)  Toileting: mod I  Bathing: mod I  Tub Shower transfers: mod I - pt reports has improved  Equipment: none  IADLs: Shopping: occasional assist getting groceries into apt on 4th floor Light housekeeping: mod I  Meal Prep: mod I  Community mobility: driving but difficulty at night Medication  management: mod I  Financial management: mod I  Handwriting: 100% legible  MOBILITY STATUS: Independent   ACTIVITY TOLERANCE: Activity tolerance: fatigues and requires rest breaks after approx 15 min. Per pt report  FUNCTIONAL OUTCOME MEASURES: Upper Extremity Functional Scale (UEFS): 52  UPPER EXTREMITY ROM:  BUE AROM WFL's   UPPER EXTREMITY MMT:   BUE MMT grossly WFL's - slightly weaker LUE   HAND FUNCTION: Grip strength: Right: 54 lbs; Left: 43.4 lbs  COORDINATION: 9 Hole Peg test: Right: 23.89 sec; Left: 29.74 sec   SENSATION: Hypersensitive on L side to light touch, feels like freezer burn, had to wear long sleeves day and night  EDEMA: None during evaluation, but notes sometimes jewelry is tighter   COGNITION: Overall cognitive status: pt reports issues with memory, problem solving, thinking but does not interfere with bill paying, medications. Pt reports occasional difficulty w/ job tasks (especially processing)   VISION: Subjective report: I have blurred vision, and mostly double vision w/o my glasses Baseline vision: Wears glasses all the time Visual history: only relating to MS, also keratoconus  VISION ASSESSMENT: To be further assessed in functional context  Patient has difficulty with following activities due to following visual impairments: job duties, reading, etc  PERCEPTION: Not tested  PRAXIS: Not tested  OBSERVATIONS: pt w/ overall fatigue, altered sensation, decreased endurance, vision deficits and mild cognitive deficits                                                                                                                             TREATMENT:  Reviewed coordination HEP as pt only doing coin exercise. Pt reports she forgets exercises but doesn't refer back to handout. Extensively reviewed coordination HEP - pt return demo of each multiple times w/ cues to remember to practice on Lt side.   Pt encouraged to bring in all exercises  including P.T. ex's and her binder to help her organize and develop Ex chart for greater carryover, as pt reports she is not doing most ex's (except putty and coin ex) including no longer doing P.T. ex's Pt agrees  Reviewed golf solitaire and played game with cues to remember game and to use Lt hand. Therapist explained what this activity is working on including: visual scanning, cognition, and coordination  Issued cane HEP - see pt instructions for details. Pt return demo of each x 10 reps with min cues to perform correctly  PATIENT EDUCATION: Education details: Energy conservation for washing dishes, coordination, golf solitaire, red putty exercises Person educated: Patient Education method: Explanation, Demonstration, Tactile cues, Verbal  cues, and Handouts Education comprehension: verbalized understanding and needs further education  HOME EXERCISE PROGRAM: 04/12/24: energy conservation techniques 05/03/2024: WARM strategies,  05/10/2024: Coordination handout, golf solitaire, putty exercises- access code; [facility] 05/28/24: cane HEP    GOALS: Goals reviewed with patient? Yes  SHORT TERM GOALS: Target date: 05/06/24  Independent with HEP for BUE's (ROM/endurance)  Baseline: has received putty exercises 05/10/24 Goal status: IN PROGRESS  2.  Independent with HEP for hand strength and coordination Baseline:  Goal status: IN PROGRESS  3.  Pt to verbalize understanding with energy conservation techniques Baseline: Able to recall 2/4 on 05-03-24 Goal status: IN PROGRESS  4.  Pt to verbalize understanding with vision HEP and some accommodations for job duties related to vision Baseline:  Goal status: IN PROGRESS   LONG TERM GOALS: Target date: 06/06/24  Pt to improve UEFI scale by 10% Baseline: Score 52- 05/10/24 Goal status: IN PROGRESS  2.  Pt to verbalize understanding with education re:  impaired sensory including desensitization Lt hand, less dropping and  and potential  A/E to increase safety w/ cooking RT hand Baseline:  Goal status: IN PROGRESS  3.  Pt to report overall less dropping w/ items Rt hand Baseline:  Goal status:IN PROGRESS  4.  Improve grip strength Lt hand by 5 lbs  Baseline: 43 lbs (Rt = 54 lbs)  Goal status: IN PROGRESS  5.  Pt to verbalize understanding with memory compensatory strategies and ways to keep thinking skills sharp  Baseline:  Goal status: IN PROGRESS   ASSESSMENT:  CLINICAL IMPRESSION: Patient presents with left-sided weakness, pain, and memory and vision deficits secondary to multiple sclerosis. She demonstrates good rehab potential, as evidenced by her ability to verbalize understanding of the importance of energy conservation and identify strategies to support memory recall, however will benefit from organized binder for HEP's and memory strategies. She will continue to benefit from skilled outpatient OT to improve functional independence and safety in ADLs and IADLs.   PERFORMANCE DEFICITS: in functional skills including ADLs, IADLs, coordination, dexterity, sensation, edema, strength, pain, Fine motor control, Gross motor control, mobility, body mechanics, endurance, decreased knowledge of precautions, decreased knowledge of use of DME, vision, and UE functional use, cognitive skills including attention, memory, and problem solving, and psychosocial skills including coping strategies.   IMPAIRMENTS: are limiting patient from IADLs, work, and leisure.   CO-MORBIDITIES: may have co-morbidities  that affects occupational performance. Patient will benefit from skilled OT to address above impairments and improve overall function.  REHAB POTENTIAL: Good   PLAN:  OT FREQUENCY: 1x/week  OT DURATION: 8 weeks  PLANNED INTERVENTIONS: 97535 self care/ADL training, 02889 therapeutic exercise, 97530 therapeutic activity, 97112 neuromuscular re-education, 97140 manual therapy, 97113 aquatic therapy, 97018 paraffin, 02960  fluidotherapy, 97010 moist heat, 97129 Cognitive training (first 15 min), 02869 Cognitive training(each additional 15 min), passive range of motion, visual/perceptual remediation/compensation, energy conservation, coping strategies training, patient/family education, and DME and/or AE instructions  RECOMMENDED OTHER SERVICES: none at this time  CONSULTED AND AGREED WITH PLAN OF CARE: Patient  PLAN FOR NEXT SESSION:  Follow up regarding job Functional task- washing dishes using EC strategies Further assess vision Memory activities/strategies  *Pt to bring in her binder with all her ex's to develop ex chart Review cane HEP   Burnard JINNY Roads, OT 05/28/2024, 8:47 AM

## 2024-05-28 NOTE — Patient Instructions (Signed)
 Cane Overhead - Standing   SIT with good posture - with arms straight, hold cane forward at waist. Raise cane above head. Hold 3 seconds. Repeat 10 times. Do 2 times per day.   ROM: Abduction - Wand   Holding wand with left hand palm up, push wand directly out to side, leading with other hand palm down, until stretch is felt. Hold 5 seconds. Repeat 10 times per set. Do 2-3 sessions per day.    ROM: Extension - Wand (Standing)   Stand holding wand behind back. Raise arms as far as possible. Repeat 10 times per set.  Do 2-3 sessions per day.

## 2024-06-01 ENCOUNTER — Ambulatory Visit: Admitting: Occupational Therapy

## 2024-06-07 ENCOUNTER — Ambulatory Visit: Attending: Neurology | Admitting: Occupational Therapy

## 2024-06-07 ENCOUNTER — Telehealth: Payer: Self-pay | Admitting: Occupational Therapy

## 2024-06-07 NOTE — Telephone Encounter (Signed)
 Called and left message re: missed O.T. appointments on 06/01/24 and today 06/07/24. Reminded patient of next O.T. appt on 06/14/24 and that we would have to cancel all remaining appts if she does not on 06/14/24. Gave return phone number to call if she has any further questions.

## 2024-06-12 ENCOUNTER — Ambulatory Visit
Admission: EM | Admit: 2024-06-12 | Discharge: 2024-06-12 | Disposition: A | Discharge status: Home / Self Care | Attending: Family Medicine | Admitting: Family Medicine

## 2024-06-12 ENCOUNTER — Other Ambulatory Visit (HOSPITAL_COMMUNITY): Payer: Self-pay

## 2024-06-12 ENCOUNTER — Other Ambulatory Visit: Payer: Self-pay

## 2024-06-12 ENCOUNTER — Encounter (HOSPITAL_BASED_OUTPATIENT_CLINIC_OR_DEPARTMENT_OTHER): Payer: Self-pay

## 2024-06-12 ENCOUNTER — Emergency Department (HOSPITAL_BASED_OUTPATIENT_CLINIC_OR_DEPARTMENT_OTHER)
Admission: EM | Admit: 2024-06-12 | Discharge: 2024-06-12 | Disposition: A | Discharge status: Home / Self Care | Attending: Emergency Medicine | Admitting: Emergency Medicine

## 2024-06-12 ENCOUNTER — Emergency Department (HOSPITAL_BASED_OUTPATIENT_CLINIC_OR_DEPARTMENT_OTHER)

## 2024-06-12 DIAGNOSIS — R100 Acute abdomen: Secondary | ICD-10-CM

## 2024-06-12 DIAGNOSIS — K2900 Acute gastritis without bleeding: Secondary | ICD-10-CM | POA: Diagnosis not present

## 2024-06-12 DIAGNOSIS — I10 Essential (primary) hypertension: Secondary | ICD-10-CM | POA: Diagnosis not present

## 2024-06-12 DIAGNOSIS — Z79899 Other long term (current) drug therapy: Secondary | ICD-10-CM | POA: Diagnosis not present

## 2024-06-12 DIAGNOSIS — R1012 Left upper quadrant pain: Secondary | ICD-10-CM | POA: Diagnosis not present

## 2024-06-12 DIAGNOSIS — R1013 Epigastric pain: Secondary | ICD-10-CM | POA: Diagnosis not present

## 2024-06-12 DIAGNOSIS — R1084 Generalized abdominal pain: Secondary | ICD-10-CM

## 2024-06-12 DIAGNOSIS — R1011 Right upper quadrant pain: Secondary | ICD-10-CM | POA: Diagnosis not present

## 2024-06-12 LAB — COMPREHENSIVE METABOLIC PANEL WITH GFR
ALT: 35 U/L (ref 0–44)
AST: 26 U/L (ref 15–41)
Albumin: 4.7 g/dL (ref 3.5–5.0)
Alkaline Phosphatase: 63 U/L (ref 38–126)
Anion gap: 13 (ref 5–15)
BUN: 8 mg/dL (ref 6–20)
CO2: 26 mmol/L (ref 22–32)
Calcium: 10.2 mg/dL (ref 8.9–10.3)
Chloride: 103 mmol/L (ref 98–111)
Creatinine, Ser: 0.75 mg/dL (ref 0.44–1.00)
GFR, Estimated: >60 mL/min (ref >60)
Glucose, Bld: 102 mg/dL — ABNORMAL HIGH (ref 70–99)
Potassium: 3.5 mmol/L (ref 3.5–5.1)
Sodium: 142 mmol/L (ref 135–145)
Total Bilirubin: 0.8 mg/dL (ref 0.0–1.2)
Total Protein: 7.3 g/dL (ref 6.5–8.1)

## 2024-06-12 LAB — URINALYSIS, MICROSCOPIC (REFLEX)

## 2024-06-12 LAB — TROPONIN T, HIGH SENSITIVITY: Troponin T High Sensitivity: <15 ng/L (ref 0–19)

## 2024-06-12 LAB — URINALYSIS, ROUTINE W REFLEX MICROSCOPIC
Bilirubin Urine: NEGATIVE
Glucose, UA: NEGATIVE mg/dL
Ketones, ur: 15 mg/dL — AB
Leukocytes,Ua: NEGATIVE
Nitrite: NEGATIVE
Protein, ur: NEGATIVE mg/dL
Specific Gravity, Urine: 1.01 (ref 1.005–1.030)
pH: 6 (ref 5.0–8.0)

## 2024-06-12 LAB — LIPASE, BLOOD: Lipase: 23 U/L (ref 11–51)

## 2024-06-12 LAB — D-DIMER, QUANTITATIVE: D-Dimer, Quant: <0.27 ug{FEU}/mL (ref 0.00–0.50)

## 2024-06-12 LAB — CBC
HCT: 43.7 % (ref 36.0–46.0)
Hemoglobin: 14.7 g/dL (ref 12.0–15.0)
MCH: 30.2 pg (ref 26.0–34.0)
MCHC: 33.6 g/dL (ref 30.0–36.0)
MCV: 89.7 fL (ref 80.0–100.0)
Platelets: 277 K/uL (ref 150–400)
RBC: 4.87 MIL/uL (ref 3.87–5.11)
RDW: 12.9 % (ref 11.5–15.5)
WBC: 4.5 K/uL (ref 4.0–10.5)
nRBC: 0 % (ref 0.0–0.2)

## 2024-06-12 MED ORDER — OMEPRAZOLE 20 MG PO CPDR
20.0000 mg | DELAYED_RELEASE_CAPSULE | Freq: Every day | ORAL | 0 refills | Status: DC
  Filled 2024-06-12: qty 14, 14d supply, fill #0

## 2024-06-12 MED ORDER — ONDANSETRON HCL 4 MG PO TABS
4.0000 mg | ORAL_TABLET | Freq: Three times a day (TID) | ORAL | 0 refills | Status: AC | PRN
  Filled 2024-06-12: qty 28, 10d supply, fill #0

## 2024-06-12 MED ORDER — ONDANSETRON HCL 4 MG PO TABS
4.0000 mg | ORAL_TABLET | Freq: Four times a day (QID) | ORAL | 0 refills | Status: DC
  Filled 2024-06-12: qty 12, 3d supply, fill #0

## 2024-06-12 MED ORDER — ONDANSETRON HCL 4 MG/2ML IJ SOLN
4.0000 mg | Freq: Once | INTRAMUSCULAR | Status: AC
  Administered 2024-06-12: 4 mg via INTRAVENOUS
  Filled 2024-06-12: qty 2

## 2024-06-12 MED ORDER — OMEPRAZOLE 20 MG PO CPDR
20.0000 mg | DELAYED_RELEASE_CAPSULE | Freq: Every day | ORAL | 0 refills | Status: AC
  Filled 2024-06-12: qty 14, 14d supply, fill #0

## 2024-06-12 MED ORDER — HYDROMORPHONE HCL 1 MG/ML IJ SOLN
1.0000 mg | Freq: Once | INTRAMUSCULAR | Status: AC
  Administered 2024-06-12: 1 mg via INTRAVENOUS
  Filled 2024-06-12: qty 1

## 2024-06-12 MED ORDER — SODIUM CHLORIDE 0.9 % IV BOLUS
500.0000 mL | Freq: Once | INTRAVENOUS | Status: AC
  Administered 2024-06-12: 500 mL via INTRAVENOUS

## 2024-06-12 MED ORDER — IOHEXOL 300 MG/ML  SOLN
100.0000 mL | Freq: Once | INTRAMUSCULAR | Status: AC | PRN
  Administered 2024-06-12: 100 mL via INTRAVENOUS

## 2024-06-12 NOTE — ED Notes (Signed)
 Pt stated that she is unable to provide urine sample at this time. Will re-evaluate shortly.

## 2024-06-12 NOTE — ED Notes (Signed)
 Pt stated that she is still unable to provide urine sample at this time. Given another cup of water. Fluid bolus completed.

## 2024-06-12 NOTE — ED Notes (Signed)
 ED Provider at bedside.

## 2024-06-12 NOTE — ED Triage Notes (Signed)
 Pt c/o left side abd pain that radiates across to right side yesterday, nausea x 5 days-NAD-steady gait

## 2024-06-12 NOTE — ED Notes (Signed)
 Patient is being discharged from the Urgent Care and sent to the Emergency Department via POV . Per [place] PA C, patient is in need of higher level of care due to acute abd. Patient is aware and verbalizes understanding of plan of care.  Vitals:   06/12/24 0919  BP: (!) 143/93  Pulse: (!) 103  Resp: 20  Temp: 99.1 F (37.3 C)  SpO2: 99%

## 2024-06-12 NOTE — Discharge Instructions (Addendum)
 Thank you for visiting the Emergency Department today. It was a pleasure to be part of your healthcare team.  You were seen today for abdominal pain and your workup was reassuring with no acute findings.  As discussed, I am sending a prescription for omeprazole  for mild gastritis, please take 1 every morning on an empty stomach until you are able to see your primary care provider for further evaluation.  I am also sending you in a prescription for ondansetron , please take as needed for nausea.  If you have any questions about your medicines, please call your pharmacy or healthcare provider. At home, rest, hydrate, and eat a bland, nonacidic diet. It is important to watch for warning signs such as worsening pain, fever, trouble breathing, or chest pain. If any of these happen, return to the Emergency Department or call 911. Thank you for trusting us  with your health.

## 2024-06-12 NOTE — ED Provider Notes (Signed)
 [facility] - URGENT CARE C[facility]  Note:  This document was prepared using Conservation officer, historic buildings and may include unintentional dictation errors.  MRN: [id number] DOB: 1970/08/08  Subjective:   Jamie Watson is a 53 y.o. female presenting for 5 day history of moderate-severe mid-upper abdominal pain that radiates across the LUQ radiates across the RUQ in waves. Has had subjective fever, nausea without vomiting. Had one bout of diarrhea. Generally urinates frequently but has not experienced any change in this. No bloody stools. No recent antibiotic use, hospitalizations or long distance travel.  Has not eaten raw foods, drank unfiltered water.  No history of GI disorders including Crohn's, IBS, ulcerative colitis. Has a history of MS. Denies dysuria, hematuria, vaginal discharge.    No current facility-administered medications for this encounter.  Current Outpatient Medications:    ALPRAZolam  (XANAX ) 0.5 MG tablet, Take 1-2 tablets 30-60 minutes prior to MRI. Can take 1 additional tablet at time of MRI if needed. Must have driver, can cause drowsiness., Disp: 3 tablet, Rfl: 0   amLODipine  (NORVASC ) 5 MG tablet, Take 1 tablet (5 mg total) by mouth at bedtime., Disp: 90 tablet, Rfl: 3   meclizine  (ANTIVERT ) 25 MG tablet, Take 1 tablet (25 mg total) by mouth 3 (three) times daily as needed for dizziness., Disp: 30 tablet, Rfl: 0   meloxicam  (MOBIC ) 7.5 MG tablet, Take 1 tablet (7.5 mg total) by mouth daily as needed., Disp: 30 tablet, Rfl: 5   ocrelizumab  300 mg in sodium chloride  0.9 % 250 mL, Inject 300 mg into the vein once., Disp: , Rfl:    oxybutynin  (DITROPAN  XL) 10 MG 24 hr tablet, Take 1 tablet (10 mg total) by mouth at bedtime., Disp: 30 tablet, Rfl: 11   traZODone  (DESYREL ) 50 MG tablet, Take 0.5-1 tablets (25-50 mg total) by mouth at bedtime as needed for sleep., Disp: 30 tablet, Rfl: 3   valsartan -hydrochlorothiazide  (DIOVAN -HCT) 160-12.5 MG tablet, Take 1 tablet by mouth  daily., Disp: 90 tablet, Rfl: 3   venlafaxine  XR (EFFEXOR  XR) 75 MG 24 hr capsule, Take 1 capsule (75 mg total) by mouth daily with breakfast., Disp: 30 capsule, Rfl: 5   Vitamin D , Ergocalciferol , (DRISDOL ) 1.25 MG (50000 UNIT) CAPS capsule, Take 1 capsule (50,000 Units total) by mouth every 7 (seven) days., Disp: 13 capsule, Rfl: 1   No Known Allergies  Past Medical History:  Diagnosis Date   Acute pain of left knee 04/19/2019   Chronic headaches    Fatigue    Hypertension    Irregular heart beat    Menopause    Migraine    Multiple sclerosis    Right arm pain 04/19/2019   Seasonal allergies    Snoring    Weight gain      Past Surgical History:  Procedure Laterality Date   TONSILLECTOMY AND ADENOIDECTOMY      Family History  Problem Relation Age of Onset   Heart disease Mother    Atrial fibrillation Mother    Cancer Father        Brain   Diabetes Father    Stroke Maternal Aunt    Cancer Maternal Aunt        Stomach   Diabetes Maternal Uncle    Diabetes Maternal Uncle    Diabetes Maternal Uncle    Diabetes Maternal Grandmother    Stroke Maternal Grandmother    Heart disease Maternal Grandfather     Social History   Tobacco Use   Smoking status:  Former    Current packs/day: 0.00    Types: Cigarettes    Quit date: 2012    Years since quitting: 13.9   Smokeless tobacco: Never  Vaping Use   Vaping status: Never Used  Substance Use Topics   Alcohol use: Yes    Comment: Rarely   Drug use: Yes    Types: Marijuana    ROS   Objective:   Vitals: BP (!) 143/93 (BP Location: Left Arm)   Pulse (!) 103   Temp 99.1 F (37.3 C) (Oral)   Resp 20   LMP 03/11/2014   SpO2 99%   Physical Exam Constitutional:      General: She is not in acute distress.    Appearance: Normal appearance. She is well-developed. She is not ill-appearing, toxic-appearing or diaphoretic.  HENT:     Head: Normocephalic and atraumatic.     Nose: Nose normal.     Mouth/Throat:      Mouth: Mucous membranes are moist.     Pharynx: Oropharynx is clear.  Eyes:     General: No scleral icterus.       Right eye: No discharge.        Left eye: No discharge.     Extraocular Movements: Extraocular movements intact.     Conjunctiva/sclera: Conjunctivae normal.  Cardiovascular:     Rate and Rhythm: Normal rate.  Pulmonary:     Effort: Pulmonary effort is normal.  Abdominal:     General: Bowel sounds are normal. There is distension.     Palpations: Abdomen is soft. There is no mass.     Tenderness: There is abdominal tenderness in the epigastric area and left upper quadrant. There is guarding. There is no right CVA tenderness, left CVA tenderness or rebound.  Skin:    General: Skin is warm and dry.  Neurological:     General: No focal deficit present.     Mental Status: She is alert and oriented to person, place, and time.  Psychiatric:        Mood and Affect: Mood normal.        Behavior: Behavior normal.        Thought Content: Thought content normal.        Judgment: Judgment normal.     Assessment and Plan :   PDMP not reviewed this encounter.  1. Acute abdomen    Patient is in need of a higher level of care than we can provide in the urgent care setting including rule out of an acute abdomen, acute diverticulitis, pancreatitis, gallbladder disease, gastritis, etc.  Patient was unable to provide urine sample in clinic given that his severity of her abdominal pain, overall symptom set, low suspicion for urinary tract infection, pyelonephritis, renal stone.  Discussed this with patient and she is in agreement.  She is hemodynamically stable and will present to the [facility] emergency room now where she prefers to be seen.   Christopher Savannah, [place] 06/12/24 [phone]

## 2024-06-12 NOTE — ED Triage Notes (Signed)
 Reports LUQ abdominal pain that radiates to the right X 8 days. Worsens today. Endorses nausea. Denies emesis. One episode of diarrhea  Sent from UC for further eval

## 2024-06-12 NOTE — Discharge Instructions (Signed)
 Please head straight to the emergency room as I am concerned you are in need of a higher level of care than we can provide in the urgent care setting.  This includes consideration for blood work, CT scan imaging to evaluate your severe belly pain.  Do not eat or drink anything until you are seen by a provider.  Go straight to the emergency room now.

## 2024-06-12 NOTE — ED Provider Notes (Signed)
 [place] [facility] AT [facility] Provider Note   CSN: [id number] Arrival date & time: 06/12/24  9056     Patient presents with: Abdominal Pain   Jamie Watson is a 53 y.o. female with a history of multiple sclerosis and hypertension, who presents to the ED with left upper quadrant abdominal pain. The symptoms started 8 days ago and have been progressively worsening since onset. The patient describes the symptoms as a pain that moves like a wave from the left side of her abdomen into the right, pain is localized to the epigastric region.  Patient states that the pain has been wax/wane until Thursday and it has stayed constant. Associated symptoms include nausea without vomiting, and a couple episodes of nonbloody diarrhea. The patient reports no chest pain, shortness of breath, or fevers.  The patient states that she drinks alcohol very rarely. The patient is no acute distress.    Abdominal Pain      Prior to Admission medications   Medication Sig Start Date End Date Taking? Authorizing Provider  omeprazole  (PRILOSEC) 20 MG capsule Take 1 capsule (20 mg total) by mouth daily for 14 days. 06/12/24 06/26/24 Yes Itzel Mckibbin L, PA  ondansetron  (ZOFRAN ) 4 MG tablet Take 1 tablet (4 mg total) by mouth every 8 (eight) hours as needed for nausea or vomiting. 06/12/24  Yes Tobby Fawcett L, PA  ALPRAZolam  (XANAX ) 0.5 MG tablet Take 1-2 tablets 30-60 minutes prior to MRI. Can take 1 additional tablet at time of MRI if needed. Must have driver, can cause drowsiness. 03/03/24   Sater, Charlie LABOR, MD  amLODipine  (NORVASC ) 5 MG tablet Take 1 tablet (5 mg total) by mouth at bedtime. 04/17/23   Thedora Garnette CHRISTELLA, MD  meclizine  (ANTIVERT ) 25 MG tablet Take 1 tablet (25 mg total) by mouth 3 (three) times daily as needed for dizziness. 04/17/23   Thedora Garnette CHRISTELLA, MD  meloxicam  (MOBIC ) 7.5 MG tablet Take 1 tablet (7.5 mg total) by mouth daily as needed. 08/13/23   Sater, Charlie LABOR, MD   ocrelizumab  300 mg in sodium chloride  0.9 % 250 mL Inject 300 mg into the vein once.    [provider]  oxybutynin  (DITROPAN  XL) 10 MG 24 hr tablet Take 1 tablet (10 mg total) by mouth at bedtime. 08/13/23   Sater, Charlie LABOR, MD  traZODone  (DESYREL ) 50 MG tablet Take 0.5-1 tablets (25-50 mg total) by mouth at bedtime as needed for sleep. 12/02/23   Thedora Garnette CHRISTELLA, MD  valsartan -hydrochlorothiazide  (DIOVAN -HCT) 160-12.5 MG tablet Take 1 tablet by mouth daily. 04/17/23   Thedora Garnette CHRISTELLA, MD  venlafaxine  XR (EFFEXOR  XR) 75 MG 24 hr capsule Take 1 capsule (75 mg total) by mouth daily with breakfast. 11/13/23   Sater, Charlie LABOR, MD  Vitamin D , Ergocalciferol , (DRISDOL ) 1.25 MG (50000 UNIT) CAPS capsule Take 1 capsule (50,000 Units total) by mouth every 7 (seven) days. 02/26/24   Sater, Charlie LABOR, MD    Allergies: Patient has no known allergies.    Review of Systems  Gastrointestinal:  Positive for abdominal pain.    Updated Vital Signs BP 100/73 (BP Location: Right Arm)   Pulse 73   Temp 97.9 F (36.6 C) (Oral)   Resp 11   LMP 03/11/2014   SpO2 100%   Physical Exam Vitals and nursing note reviewed.  Constitutional:      General: She is not in acute distress.    Appearance: Normal appearance.  HENT:  Head: Normocephalic and atraumatic.  Eyes:     Extraocular Movements: Extraocular movements intact.     Conjunctiva/sclera: Conjunctivae normal.     Pupils: Pupils are equal, round, and reactive to light.  Cardiovascular:     Rate and Rhythm: Normal rate and regular rhythm.     Pulses: Normal pulses.  Pulmonary:     Effort: Pulmonary effort is normal. No respiratory distress.     Breath sounds: Normal breath sounds.     Comments: Patient has no difficulty speaking complete sentences. Abdominal:     General: Abdomen is flat.     Palpations: Abdomen is soft.     Tenderness: There is abdominal tenderness in the right upper quadrant, epigastric area and left upper quadrant.      Comments: Tenderness to palpation of epigastric region.  No CVA tenderness, guarding, rebound, or distention.  Musculoskeletal:        General: Normal range of motion.     Cervical back: Normal range of motion.     Right lower leg: No edema.     Left lower leg: No edema.  Skin:    General: Skin is warm and dry.     Capillary Refill: Capillary refill takes less than 2 seconds.  Neurological:     General: No focal deficit present.     Mental Status: She is alert. Mental status is at baseline.  Psychiatric:        Mood and Affect: Mood normal.     (all labs ordered are listed, but only abnormal results are displayed) Labs Reviewed  COMPREHENSIVE METABOLIC PANEL WITH GFR - Abnormal; Notable for the following components:      Result Value   Glucose, Bld 102 (*)    All other components within normal limits  URINALYSIS, ROUTINE W REFLEX MICROSCOPIC - Abnormal; Notable for the following components:   APPearance HAZY (*)    Hgb urine dipstick SMALL (*)    Ketones, ur 15 (*)    All other components within normal limits  URINALYSIS, MICROSCOPIC (REFLEX) - Abnormal; Notable for the following components:   Bacteria, UA RARE (*)    All other components within normal limits  LIPASE, BLOOD  CBC  D-DIMER, QUANTITATIVE (NOT AT [facility])  TROPONIN T, HIGH SENSITIVITY    EKG: EKG Interpretation Date/Time:  Saturday June 12 2024 10:20:57 EST Ventricular Rate:  85 PR Interval:  187 QRS Duration:  89 QT Interval:  358 QTC Calculation: 426 R Axis:   41  Text Interpretation: Sinus rhythm No significant change since last tracing Confirmed by Dreama Longs ([id number]) on 06/12/2024 2:51:26 PM  Radiology: CT ABDOMEN PELVIS W CONTRAST Result Date: 06/12/2024 EXAM: CT ABDOMEN AND PELVIS WITH CONTRAST 06/12/2024 11:36:56 AM TECHNIQUE: CT of the abdomen and pelvis was performed with the administration of 100 mL of iohexol  (OMNIPAQUE ) 300 MG/ML solution. Multiplanar reformatted images are provided  for review. Automated exposure control, iterative reconstruction, and/or weight-based adjustment of the mA/kV was utilized to reduce the radiation dose to as low as reasonably achievable. COMPARISON: None available. CLINICAL HISTORY: Abdominal pain, acute, nonlocalized. FINDINGS: LOWER CHEST: No acute abnormality. LIVER: Hepatic steatosis. GALLBLADDER AND BILE DUCTS: Gallbladder is unremarkable. No biliary ductal dilatation. SPLEEN: No acute abnormality. PANCREAS: No acute abnormality. ADRENAL GLANDS: No acute abnormality. KIDNEYS, URETERS AND BLADDER: No stones in the kidneys or ureters. No hydronephrosis. No perinephric or periureteral stranding. Urinary bladder is unremarkable. GI AND BOWEL: Stomach demonstrates no acute abnormality. There is no bowel obstruction. PERITONEUM AND RETROPERITONEUM:  No ascites. No free air. VASCULATURE: Aorta is normal in caliber. LYMPH NODES: No lymphadenopathy. REPRODUCTIVE ORGANS: No acute abnormality. BONES AND SOFT TISSUES: No acute osseous abnormality. No focal soft tissue abnormality. IMPRESSION: 1. No acute findings in the abdomen or pelvis. 2. Hepatic steatosis. Electronically signed by: Lynwood Seip MD 06/12/2024 12:00 PM EST RP Workstation: [id number]     Procedures   Medications Ordered in the ED  HYDROmorphone  (DILAUDID ) injection 1 mg (1 mg Intravenous Given 06/12/24 1020)  ondansetron  (ZOFRAN ) injection 4 mg (4 mg Intravenous Given 06/12/24 1020)  iohexol  (OMNIPAQUE ) 300 MG/ML solution 100 mL (100 mLs Intravenous Contrast Given 06/12/24 1125)  sodium chloride  0.9 % bolus 500 mL (0 mLs Intravenous Stopped 06/12/24 1308)  HYDROmorphone  (DILAUDID ) injection 1 mg (1 mg Intravenous Given 06/12/24 1533)    Clinical Course as of 06/12/24 2017  Sat Jun 12, 2024  1001 Temp: 98.5 F (36.9 C) Afebrile, vital stable, patient in no acute distress. [ML]  1126 Comprehensive metabolic panel(!) No acute findings [ML]  1126 Lipase, blood WNL [ML]  1126 Patient given  hydromorphone  and Zofran  for pain and nausea relief [ML]  1151 On reassessment, patient states that hydromorphone  worked well for pain. [ML]  1213 CT ABDOMEN PELVIS W CONTRAST No acute findings [ML]  1214 ED EKG Sinus rhythm [ML]  1429 Urinalysis, Routine w reflex microscopic -Urine, Clean Catch(!) Urinalysis negative [ML]  1632 Troponin T, High Sensitivity Negative -no need for repeat [ML]  1633 D-dimer, quantitative Negative [ML]  1731 Temp: 97.9 F (36.6 C) Patient remained afebrile, vitals WNL. [ML]    Clinical Course User Index [ML] Willma Duwaine CROME, [place]                                 Medical Decision Making Amount and/or Complexity of Data Reviewed Labs: ordered. Decision-making details documented in ED Course. Radiology: ordered. Decision-making details documented in ED Course. ECG/medicine tests:  Decision-making details documented in ED Course.  Risk Prescription drug management.   Patient presents to the ED for: Epigastric pain This involves an extensive number of treatment options  Differential diagnosis includes: Gastritis/peptic ulcer Viral gastroenteritis Pancreatitis Cardiac etiology Co-morbid conditions: Obesity, multiple sclerosis, hypertension  Additional history/records obtained and reviewed: Additional history obtained from  outside medical records External records from outside source obtained and reviewed including PCP visits to include medical history, allergies, medications.  Clinical Course as of 06/12/24 2017  Sat Jun 12, 2024  1001 Temp: 98.5 F (36.9 C) Afebrile, vital stable, patient in no acute distress. [ML]  1126 Comprehensive metabolic panel(!) No acute findings [ML]  1126 Lipase, blood WNL [ML]  1126 Patient given hydromorphone  and Zofran  for pain and nausea relief [ML]  1151 On reassessment, patient states that hydromorphone  worked well for pain. [ML]  1213 CT ABDOMEN PELVIS W CONTRAST No acute findings [ML]  1214 ED EKG Sinus  rhythm [ML]  1429 Urinalysis, Routine w reflex microscopic -Urine, Clean Catch(!) Urinalysis negative [ML]  1632 Troponin T, High Sensitivity Negative -no need for repeat [ML]  1633 D-dimer, quantitative Negative [ML]  1731 Temp: 97.9 F (36.6 C) Patient remained afebrile, vitals WNL. [ML]    Clinical Course User Index [ML] Willma Duwaine CROME, [place]    Data Reviewed / Actions Taken: Labs ordered/reviewed with my independent interpretation in ED course above. Imaging ordered/reviewed with my independent interpretation in ED course above. I agree with the radiologists interpretation.  EKG ordered/reviewed  with my independent interpretation in ED course above. The patient was kept on continuous cardiac monitoring during the ED stay. Key findings for the patient were reviewed with the attending physician.  Management / Treatments: See ED course above for medications, treatments administered, and clinical rationale.   Reevaluation of the patient after these medicines showed that the patient improved. I have reviewed the patients home medicines and have made adjustments as needed  ED Course / Reassessments: Problem List: Gastritis 53 year old female presented for epigastric pain. Initial assessment included history, physical exam, and review of prior medical records. Patients physical exam included revealed epigastric tenderness. Laboratory studies, imaging, and other ancillary studies were obtained revealed no acute findings. Pain and symptoms were addressed during the visit. Vital signs were obtained and monitored, and the patient remained stable throughout the stay.  Given reassuring workup, patient to be discharged with supportive care measures and prescription for ondansetron  and omeprazole  for symptom relief. Patient response: Improved with pain management Serial reassessments performed: Yes    Disposition: Disposition: Discharge with close follow-up with PCP for further evaluation and  care. Rationale for disposition: Stable for discharge The disposition plan and rationale were discussed with the patient at the bedside, all questions were addressed, and the patient demonstrated understanding.  This note was produced using Electronics Engineer. While I have reviewed and verified all clinical information, transcription errors may remain.      Final diagnoses:  Generalized abdominal pain  Acute gastritis without hemorrhage, unspecified gastritis type    ED Discharge Orders          Ordered    ondansetron  (ZOFRAN ) 4 MG tablet  Every 6 hours,   Status:  Discontinued        06/12/24 1730    omeprazole  (PRILOSEC) 20 MG capsule  Daily,   Status:  Discontinued        06/12/24 1730    omeprazole  (PRILOSEC) 20 MG capsule  Daily        06/12/24 1744    ondansetron  (ZOFRAN ) 4 MG tablet  Every 8 hours PRN        06/12/24 1744               Aslee Such L, [place] 06/12/24 2022    Dreama Longs, MD 06/15/24 [phone]

## 2024-06-13 ENCOUNTER — Other Ambulatory Visit (HOSPITAL_COMMUNITY): Payer: Self-pay

## 2024-06-14 ENCOUNTER — Ambulatory Visit: Payer: Self-pay

## 2024-06-14 ENCOUNTER — Ambulatory Visit: Admitting: Occupational Therapy

## 2024-06-14 ENCOUNTER — Telehealth: Payer: Self-pay | Admitting: Family Medicine

## 2024-06-14 NOTE — Telephone Encounter (Signed)
 Call disconnected prior to warm transfer. Nurse will attempt to contact patient.

## 2024-06-14 NOTE — Telephone Encounter (Signed)
 FYI Only or Action Required?: FYI only for provider: patient calling back today to schedule appointment within 24 hours.  Patient was last seen in primary care on 12/02/2023 by Jamie Garnette HERO, MD.  Called Nurse Triage reporting Abdominal Pain.Hospital Follow Up  Symptoms began several weeks ago.  Interventions attempted: Prescription medications: Omeprazole  and Rest, hydration, or home remedies.  Symptoms are: unchanged.  Triage Disposition: See Physician Within 24 Hours  Patient/caregiver understands and will follow disposition?: Yes    Copied from CRM [id number]. Topic: Clinical - Red Word Triage >> Jun 14, 2024  8:06 AM Jamie Jamie wrote: Red Word that prompted transfer to Nurse Triage: Hospital follow up - severe abdominal pain and would like to be test for Jamie Jamie .Jamie Watson Still having the severe upper abdominal pain, chills, headache, nausea. Reason for Disposition  [1] MODERATE pain (e.g., interferes with normal activities) AND [2] pain comes and goes (cramps) AND [3] present > 24 hours  (Exception: Pain with Vomiting or Diarrhea - see that Guideline.)  Answer Assessment - Initial Assessment Questions Additional info: 1) She would like Jamie.Watson testing.  2) Advised patient of evaluation within 24 hours, pcp does not have availability until Friday, Reviewed other provider appointment availability with patient and she stated she will need to call back to schedule.  3) Patient calling back today to schedule hospital follow up.   1. LOCATION: Where does it hurt?      Just below left breast moves across upper abdomen to right side 2. RADIATION: Does the pain shoot anywhere else? (e.g., chest, back)     Right sided abdomen 3. ONSET: When did the pain begin? (e.g., minutes, hours or days ago)      1.5 weeks ago 4. SUDDEN: Gradual or sudden onset?     Sudden 5. PATTERN Does the pain come and go, or is it constant?     Intermittent  6. SEVERITY: How bad is the pain?  (e.g.,  Scale 1-10; mild, moderate, or severe)     8/10, current is 5/10 7. RECURRENT SYMPTOM: Have you ever had this type of stomach pain before? If Yes, ask: When was the last time? and What happened that time?      no 8. CAUSE: What do you think is causing the stomach pain? (e.g., gallstones, recent abdominal surgery)     Unsure 9. RELIEVING/AGGRAVATING FACTORS: What makes it better or worse? (e.g., antacids, bending or twisting motion, bowel movement)    ED Rx Ondansetron , omeprazole   10. OTHER SYMPTOMS: Do you have any other symptoms? (e.g., back pain, diarrhea, fever, urination pain, vomiting)       Headache, nausea, chills 11. PREGNANCY: Is there any chance you are pregnant? When was your last menstrual period?  Protocols used: Abdominal Pain - Female-A-AH

## 2024-06-14 NOTE — Telephone Encounter (Signed)
 E2C2 tried to bring pt on but the call dropped so I called her back. She was seen in ED [facility] over the weekend for severe abdominal pain, nausea, vomiting, and fever. She was told to be seen by her pcp. She was triaged when she called this am. She was told to see Dr Thedora within 24 hours. Dr Thedora was full so I asked Dedra what we should do. It was recommended that she return to the ED due to it sounds like her pain is severe. She was in much discomfort. She understood and agreed.

## 2024-06-14 NOTE — Telephone Encounter (Signed)
See other message.  Dm/cma  

## 2024-06-14 NOTE — Telephone Encounter (Signed)
 Noted. Dm/cma

## 2024-06-14 NOTE — Telephone Encounter (Signed)
   FYI Only or Action Required?: FYI only for provider: ED advised.  Patient was last seen in primary care on 12/02/2023 by Thedora Garnette HERO, MD.  Called Nurse Triage reporting Abdominal Pain.  Symptoms began several days ago.  Interventions attempted: Prescription medications: omeprazole .  Symptoms are: unchanged.  Triage Disposition: Go to ED Now (Notify PCP)  Patient/caregiver understands and will follow disposition?: No, wishes to speak with PCP   Copied from CRM #[id number]. Topic: Clinical - Red Word Triage >> Jun 14, 2024  9:23 AM Carrielelia G wrote: Red Word that prompted transfer to Nurse Triage: upper abdominal pain, recently went to UC then was transferred to the ER Reason for Disposition  [1] SEVERE pain (e.g., excruciating) AND [2] present > 1 hour    Per CAL  Answer Assessment - Initial Assessment Questions Per clinical notes, patient was advised ED.  Nurse triaged patient and advised ED and to call back after evaluation to scheduled f/u appt.  Advised 911 if symptoms worsen, patient verbalized understanding.  Patient reports may just wait to see if medication helps.  1. LOCATION: Where does it hurt?      Under left breast to abdomen, laying down makes worse and sitting up makes feel better, burning sensation, burping 2. RADIATION: Does the pain shoot anywhere else? (e.g., chest, back)     Under left breast, upper left moves to right side, occurs every 3 to 6 minutes 3. ONSET: When did the pain begin? (e.g., minutes, hours or days ago)      Friday 4. SUDDEN: Gradual or sudden onset?     sudden 5. PATTERN Does the pain come and go, or is it constant?     constant 6. SEVERITY: How bad is the pain?  (e.g., Scale 1-10; mild, moderate, or severe)     5/10 7. RECURRENT SYMPTOM: Have you ever had this type of stomach pain before? If Yes, ask: When was the last time? and What happened that time?      yes 8. CAUSE: What do you think is causing the  stomach pain? (e.g., gallstones, recent abdominal surgery)     unsure 9. RELIEVING/AGGRAVATING FACTORS: What makes it better or worse? (e.g., antacids, bending or twisting motion, bowel movement)     Took omeprazole , last night 10. OTHER SYMPTOMS: Do you have any other symptoms? (e.g., back pain, diarrhea, fever, urination pain, vomiting)       Denies fever, n/v/d, back pain, chest pain, urination pain,  Protocols used: Abdominal Pain - Female-A-AH

## 2024-06-15 ENCOUNTER — Ambulatory Visit: Payer: Self-pay

## 2024-06-15 NOTE — Telephone Encounter (Signed)
 Pt states that she was seen in the ED, pt states pain is not worse. Pt inquiring about H. Pylori testing and where the testing can be done. Pt states that she would like PCP to advise where the testing can be completed. Pt does not want to schedule ED f/u at this time.   Copied from CRM #[id number]. Topic: Clinical - Red Word Triage >> Jun 15, 2024 11:18 AM Suzen RAMAN wrote: Red Word that prompted transfer to Nurse Triage: severe abdominal pain, needs a hospital follow up(06/12/24) and is requesting H. Pylori testing.

## 2024-06-16 NOTE — Telephone Encounter (Signed)
 Pt has an appt scheduled with you on Friday but the reason is blank. I have messaged the lab to see if we do H Pylori testing here.

## 2024-06-18 ENCOUNTER — Inpatient Hospital Stay: Admitting: Family Medicine

## 2024-06-21 ENCOUNTER — Ambulatory Visit: Admitting: Family Medicine

## 2024-06-21 ENCOUNTER — Encounter: Payer: Self-pay | Admitting: Family Medicine

## 2024-06-21 ENCOUNTER — Ambulatory Visit: Admitting: Occupational Therapy

## 2024-06-21 VITALS — BP 120/66 | HR 88 | Temp 98.1°F | Ht 66.0 in | Wt 207.4 lb

## 2024-06-21 DIAGNOSIS — K2900 Acute gastritis without bleeding: Secondary | ICD-10-CM

## 2024-06-21 NOTE — Progress Notes (Signed)
 [facility] [address] [place] [address] Dept: [phone] Dept Fax: [phone]  Office Visit  Subjective:    Patient ID: Jamie Watson, female    DOB: February 08, 1971, 53 y.o..   MRN: [id number]  Chief Complaint  Patient presents with   Hospitalization Follow-up    Hospital f/u for abdominal pain on 06/12/24.      History of Present Illness:  Patient is in today for emergency department follow-up. Jamie Watson was seen on 12/6, initially in UC, with acute epigastric and RUQ abdominal pain. She was transferred from [facility] to [facility] ED for further eval. she notes that she had eaten wings from [employer] and had acute onset of a burning pain across the upper abdomen. The ED treated her with hydromorphone  and ondansetron , which calmed her symptoms. He labs and CT were normal. Since returning home, she had a 2nd bout of nausea last week. She has had no further symptoms over the weekend. She is now on omeprazole  20 mg daily.  Past Medical History: Patient Active Problem List   Diagnosis Date Noted   Primary insomnia 12/02/2023   Dyshidrotic eczema 12/02/2023   Gait disturbance 08/13/2023   Depression, major, single episode, moderate (HCC) 07/18/2023   Allodynia 07/18/2023   Vertigo 04/17/2023   High risk medication use 04/08/2023   Vitamin D  deficiency 04/08/2023   Weakness 03/27/2023   Multiple sclerosis, relapsing-remitting 03/27/2023   Hypokalemia 03/27/2023   Obesity (BMI 30-39.9) 03/27/2023   Borderline hyperlipidemia 01/31/2022   Prediabetes 01/31/2022   Essential hypertension 01/28/2022   Marijuana use, continuous 01/28/2022   Past Surgical History:  Procedure Laterality Date   TONSILLECTOMY AND ADENOIDECTOMY     Family History  Problem Relation Age of Onset   Heart disease Mother    Atrial fibrillation Mother    Cancer Father        Brain   Diabetes Father    Stroke Maternal Aunt    Cancer Maternal  Aunt        Stomach   Diabetes Maternal Uncle    Diabetes Maternal Uncle    Diabetes Maternal Uncle    Diabetes Maternal Grandmother    Stroke Maternal Grandmother    Heart disease Maternal Grandfather    Outpatient Medications Prior to Visit  Medication Sig Dispense Refill   ALPRAZolam  (XANAX ) 0.5 MG tablet Take 1-2 tablets 30-60 minutes prior to MRI. Can take 1 additional tablet at time of MRI if needed. Must have driver, can cause drowsiness. 3 tablet 0   amLODipine  (NORVASC ) 5 MG tablet Take 1 tablet (5 mg total) by mouth at bedtime. 90 tablet 3   meclizine  (ANTIVERT ) 25 MG tablet Take 1 tablet (25 mg total) by mouth 3 (three) times daily as needed for dizziness. 30 tablet 0   meloxicam  (MOBIC ) 7.5 MG tablet Take 1 tablet (7.5 mg total) by mouth daily as needed. 30 tablet 5   ocrelizumab  300 mg in sodium chloride  0.9 % 250 mL Inject 300 mg into the vein once.     omeprazole  (PRILOSEC) 20 MG capsule Take 1 capsule (20 mg total) by mouth daily for 14 days. 14 capsule 0   ondansetron  (ZOFRAN ) 4 MG tablet Take 1 tablet (4 mg total) by mouth every 8 (eight) hours as needed for nausea or vomiting. 28 tablet 0   oxybutynin  (DITROPAN  XL) 10 MG 24 hr tablet Take 1 tablet (10 mg total) by mouth at bedtime. 30 tablet 11  traZODone  (DESYREL ) 50 MG tablet Take 0.5-1 tablets (25-50 mg total) by mouth at bedtime as needed for sleep. 30 tablet 3   valsartan -hydrochlorothiazide  (DIOVAN -HCT) 160-12.5 MG tablet Take 1 tablet by mouth daily. 90 tablet 3   venlafaxine  XR (EFFEXOR  XR) 75 MG 24 hr capsule Take 1 capsule (75 mg total) by mouth daily with breakfast. 30 capsule 5   Vitamin D , Ergocalciferol , (DRISDOL ) 1.25 MG (50000 UNIT) CAPS capsule Take 1 capsule (50,000 Units total) by mouth every 7 (seven) days. 13 capsule 1   No facility-administered medications prior to visit.   Allergies[1]   Objective:   Today's Vitals   06/21/24 1102  BP: 120/66  Pulse: 88  Temp: 98.1 F (36.7 C)  TempSrc:  Temporal  SpO2: 99%  Weight: 207 lb 6.4 oz (94.1 kg)  Height: 5' 6 (1.676 m)   Body mass index is 33.48 kg/m.   General: Well developed, well nourished. No acute distress. Abdomen: Soft. Mild tenderness in RUQ, but no guarding.  Psych: Alert and oriented. Normal mood and affect.  Health Maintenance Due  Topic Date Due   Hepatitis B Vaccines 19-59 Average Risk (2 of 3 - 19+ 3-dose series) 03/15/2004   COVID-19 Vaccine (3 - Pfizer risk series) 04/26/2020   Pneumococcal Vaccine: 50+ Years (1 of 1 - PCV) Never done   Influenza Vaccine  02/06/2024     Imaging: CT of Abdomen and Pelvis w contrast (06/12/2024) IMPRESSION: 1. No acute findings in the abdomen or pelvis. 2. Hepatic steatosis.  Lab Results:    Latest Ref Rng & Units 06/12/2024    9:52 AM 02/25/2024    3:48 PM 04/02/2023   11:30 AM  CMP  Glucose 70 - 99 mg/dL 897  74  895   BUN 6 - 20 mg/dL 8  9  18    Creatinine 0.44 - 1.00 mg/dL 9.24  9.14  9.12   Sodium 135 - 145 mmol/L 142  140  139   Potassium 3.5 - 5.1 mmol/L 3.5  4.0  3.1   Chloride 98 - 111 mmol/L 103  101  100   CO2 22 - 32 mmol/L 26  24  30    Calcium 8.9 - 10.3 mg/dL 89.7  9.8  9.2   Total Protein 6.5 - 8.1 g/dL 7.3  6.6  6.6   Total Bilirubin 0.0 - 1.2 mg/dL 0.8  0.3  1.0   Alkaline Phos 38 - 126 U/L 63  72  58   AST 15 - 41 U/L 26  22  13    ALT 0 - 44 U/L 35  25  33       Latest Ref Rng & Units 06/12/2024    9:52 AM 02/25/2024    3:48 PM 04/02/2023   11:30 AM  CBC  WBC 4.0 - 10.5 K/uL 4.5  4.1  5.9   Hemoglobin 12.0 - 15.0 g/dL 85.2  86.1  84.9   Hematocrit 36.0 - 46.0 % 43.7  42.8  45.3   Platelets 150 - 400 K/uL 277  286  289.0    Lipase     Component Value Date/Time   LIPASE 23 06/12/2024 0952   Urinalysis    Component Value Date/Time   COLORURINE YELLOW 06/12/2024 1327   APPEARANCEUR HAZY (A) 06/12/2024 1327   LABSPEC 1.010 06/12/2024 1327   PHURINE 6.0 06/12/2024 1327   GLUCOSEU NEGATIVE 06/12/2024 1327   HGBUR SMALL (A) 06/12/2024  1327   BILIRUBINUR NEGATIVE 06/12/2024 1327   KETONESUR 15 (  A) 06/12/2024 1327   PROTEINUR NEGATIVE 06/12/2024 1327   NITRITE NEGATIVE 06/12/2024 1327   LEUKOCYTESUR NEGATIVE 06/12/2024 1327   Assessment & Plan:   Problem List Items Addressed This Visit   None Visit Diagnoses       Acute gastritis without hemorrhage, unspecified gastritis type    -  Primary   Possible this was secondary to food poisoning. I recommend she continue Prilosec for 1 month and then give a trial off. If recurs, return to see me.       Return in about 3 months (around 09/19/2024).    Jamie CHRISTELLA Simpler, MD  I,Emily Lagle,acting as a scribe for Jamie CHRISTELLA Simpler, MD.,have documented all relevant documentation on the behalf of Jamie CHRISTELLA Simpler, MD.  I, Jamie CHRISTELLA Simpler, MD, have reviewed all documentation for this visit. The documentation on 06/21/2024 for the exam, diagnosis, procedures, and orders are all accurate and complete.     [1] No Known Allergies

## 2024-06-23 DIAGNOSIS — Z1231 Encounter for screening mammogram for malignant neoplasm of breast: Secondary | ICD-10-CM | POA: Diagnosis not present

## 2024-06-30 ENCOUNTER — Other Ambulatory Visit (HOSPITAL_COMMUNITY): Payer: Self-pay

## 2024-06-30 ENCOUNTER — Other Ambulatory Visit: Payer: Self-pay | Admitting: Family Medicine

## 2024-06-30 DIAGNOSIS — I10 Essential (primary) hypertension: Secondary | ICD-10-CM

## 2024-07-02 ENCOUNTER — Other Ambulatory Visit (HOSPITAL_COMMUNITY): Payer: Self-pay

## 2024-07-02 MED ORDER — VALSARTAN-HYDROCHLOROTHIAZIDE 160-12.5 MG PO TABS
1.0000 | ORAL_TABLET | Freq: Every day | ORAL | 3 refills | Status: AC
  Filled 2024-07-02: qty 90, 90d supply, fill #0

## 2024-07-02 MED ORDER — AMLODIPINE BESYLATE 5 MG PO TABS
5.0000 mg | ORAL_TABLET | Freq: Every day | ORAL | 3 refills | Status: AC
  Filled 2024-07-02: qty 90, 90d supply, fill #0

## 2024-07-26 ENCOUNTER — Other Ambulatory Visit (HOSPITAL_COMMUNITY): Payer: Self-pay

## 2024-07-26 ENCOUNTER — Ambulatory Visit: Admitting: Internal Medicine

## 2024-07-26 ENCOUNTER — Encounter: Payer: Self-pay | Admitting: Internal Medicine

## 2024-07-26 VITALS — BP 120/80 | HR 117 | Temp 98.5°F | Ht 66.0 in | Wt 200.4 lb

## 2024-07-26 DIAGNOSIS — J069 Acute upper respiratory infection, unspecified: Secondary | ICD-10-CM | POA: Diagnosis not present

## 2024-07-26 LAB — POCT INFLUENZA A/B
Influenza A, POC: NEGATIVE
Influenza B, POC: NEGATIVE

## 2024-07-26 LAB — POC COVID19 BINAXNOW: SARS Coronavirus 2 Ag: NEGATIVE

## 2024-07-26 MED ORDER — AZITHROMYCIN 250 MG PO TABS
ORAL_TABLET | ORAL | 0 refills | Status: AC
  Filled 2024-07-26: qty 6, 5d supply, fill #0

## 2024-07-26 NOTE — Patient Instructions (Signed)
 Rest, drink plenty of fluids.  Tylenol  for fever, body aches.   For cough: Take Mucinex DM or Robitussin-DM over-the-counter.  Follow the instructions in the box.  For sore throat:  Use over-the-counter throat lozenges as needed  Use over-the-counter chloraseptic spray as needed  Warm salt water gargles  Tylenol : take as directed on packaging.   For nasal and sinus congestion: Use over-the-counter Flonase: 2 nasal sprays on each side of the nose in the morning until you feel better  Use over-the-counter Astepro 2 nasal sprays on each side of the nose twice daily until better  Patients with high blood pressure can use Coricidin HBP for decongestant, it will not raise your blood pressure.     If you have been prescribed an antibiotic, take as prescribed.  Call if not gradually better over the next  10 days  Call anytime if the symptoms are severe, you have high fever, short of breath, chest pain

## 2024-07-26 NOTE — Progress Notes (Signed)
 " [facility] [address] [place] [address] Dept: [phone] Dept Fax: [phone]  Acute Care Office Visit  Subjective:   Jamie Watson 06-Jan-1971 07/26/2024  Chief Complaint  Patient presents with   Sore Throat    Started Saturday itchy ear, cough, sinus drainage    HPI:  Jamie Watson is a 54 year old female who presents with symptoms of an upper respiratory infection.  Symptoms began on Saturday night at work with a scratchy throat. By Sunday morning, she experienced significant sinus drainage and a sore throat, although not severely painful. She describes a sensation of drainage trickling down her throat and into her chest, along with itchy ears.  No fever, chest pain, difficulty breathing, or wheezing. She mentions an occasional cough and a sensation of ear pressure, but not a true earache. She also reports a stuffy head feeling but no sinus pressure around her eyes.  She has taken an over-the-counter sinus tablet last night, which did not provide relief. 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 She has not been around anyone known to be sick.  No fever, body aches, nausea, vomiting, or diarrhea.      The following portions of the patient's history were reviewed and updated as appropriate: past medical history, past surgical history, family history, social history, allergies, medications, and problem list.   Patient Active Problem List   Diagnosis Date Noted   Primary insomnia 12/02/2023   Dyshidrotic eczema 12/02/2023   Gait disturbance 08/13/2023   Depression, major, single episode, moderate (HCC) 07/18/2023   Allodynia 07/18/2023   Vertigo 04/17/2023   High risk medication use 04/08/2023   Vitamin D  deficiency 04/08/2023   Weakness 03/27/2023   Multiple sclerosis, relapsing-remitting 03/27/2023   Hypokalemia 03/27/2023   Obesity (BMI 30-39.9) 03/27/2023   Borderline  hyperlipidemia 01/31/2022   Prediabetes 01/31/2022   Essential hypertension 01/28/2022   Marijuana use, continuous 01/28/2022   Past Medical History:  Diagnosis Date   Acute pain of left knee 04/19/2019   Chronic headaches    Fatigue    Hypertension    Irregular heart beat    Menopause    Migraine    Multiple sclerosis    Right arm pain 04/19/2019   Seasonal allergies    Snoring    Weight gain    Past Surgical History:  Procedure Laterality Date   TONSILLECTOMY AND ADENOIDECTOMY     Family History  Problem Relation Age of Onset   Heart disease Mother    Atrial fibrillation Mother    Cancer Father        Brain   Diabetes Father    Stroke Maternal Aunt    Cancer Maternal Aunt        Stomach   Diabetes Maternal Uncle    Diabetes Maternal Uncle    Diabetes Maternal Uncle    Diabetes Maternal Grandmother    Stroke Maternal Grandmother    Heart disease Maternal Grandfather    Current Medications[1] Allergies[2]   ROS: A complete ROS was performed with pertinent positives/negatives noted in the HPI. The remainder of the ROS are negative.    Objective:   Today's Vitals   07/26/24 0950  BP: 120/80  Pulse: (!) 117  Temp: 98.5 F (36.9 C)  TempSrc: Temporal  SpO2: 98%  Weight: 200 lb 6.4 oz (90.9 kg)  Height: 5' 6 (1.676 m)    GENERAL: Well-appearing, in NAD. Well nourished.  SKIN:  Pink, warm and dry. No rash.  HEENT:    HEAD: Normocephalic, non-traumatic.  EYES: Conjunctive pink without exudate. PERRL.  EARS: External ear w/o redness, swelling, masses, or lesions. EAC clear. TM's intact, translucent w/o bulging, appropriate landmarks visualized.  NOSE: Septum midline w/o deformity. Nares patent, mucosa pink and inflamed with clear-white drainage. No sinus tenderness.  THROAT: Uvula midline. Oropharynx with clear PND. Tonsils absent. Mucus membranes pink and moist.  NECK: Trachea midline. Full ROM w/o pain or tenderness. Enlarged bilateral submandibular  lymph nodes  RESPIRATORY: Chest wall symmetrical. Respirations even and non-labored. Breath sounds clear to auscultation bilaterally.  CARDIAC: S1, S2 present, regular rate and rhythm. Peripheral pulses 2+ bilaterally.  EXTREMITIES: Without clubbing, cyanosis, or edema.  NEUROLOGIC: Steady, even gait.  PSYCH/MENTAL STATUS: Alert, oriented x 3. Cooperative, appropriate mood and affect.    Results for orders placed or performed in visit on 07/26/24  POC COVID-19  Result Value Ref Range   SARS Coronavirus 2 Ag Negative Negative  POCT Influenza A/B  Result Value Ref Range   Influenza A, POC Negative Negative   Influenza B, POC Negative Negative      Assessment & Plan:  Assessment and Plan    Acute upper respiratory infection Likely viral etiology with negative COVID-19 and influenza tests.  - Treat with Mucinex DM, Coricidin HBP, throat lozenges or chloraseptic spray, warm salt water gargles, and Tylenol  as needed. - Start azithromycin  if no improvement in 4-5 days.       Meds ordered this encounter  Medications   azithromycin  (ZITHROMAX ) 250 MG tablet    Sig: Take 2 tablets on day 1, then 1 tablet daily on days 2 through 5    Dispense:  6 tablet    Refill:  0    Supervising Provider:   THOMPSON, AARON B [[id number]]   Orders Placed This Encounter  Procedures   POC COVID-19   POCT Influenza A/B   Lab Orders         POC COVID-19         POCT Influenza A/B     No images are attached to the encounter or orders placed in the encounter.  Return if symptoms worsen or fail to improve.   Rosina Senters, FNP     [1]  Current Outpatient Medications:    amLODipine  (NORVASC ) 5 MG tablet, Take 1 tablet (5 mg total) by mouth at bedtime., Disp: 90 tablet, Rfl: 3   azithromycin  (ZITHROMAX ) 250 MG tablet, Take 2 tablets on day 1, then 1 tablet daily on days 2 through 5, Disp: 6 tablet, Rfl: 0   meclizine  (ANTIVERT ) 25 MG tablet, Take 1 tablet (25 mg total) by mouth 3 (three) times  daily as needed for dizziness., Disp: 30 tablet, Rfl: 0   meloxicam  (MOBIC ) 7.5 MG tablet, Take 1 tablet (7.5 mg total) by mouth daily as needed., Disp: 30 tablet, Rfl: 5   ocrelizumab  300 mg in sodium chloride  0.9 % 250 mL, Inject 300 mg into the vein once., Disp: , Rfl:    omeprazole  (PRILOSEC) 20 MG capsule, Take 1 capsule (20 mg total) by mouth daily for 14 days. (Patient taking differently: Take 20 mg by mouth as needed.), Disp: 14 capsule, Rfl: 0   ondansetron  (ZOFRAN ) 4 MG tablet, Take 1 tablet (4 mg total) by mouth every 8 (eight) hours as needed for nausea or vomiting., Disp: 28 tablet, Rfl: 0   valsartan -hydrochlorothiazide  (DIOVAN -HCT) 160-12.5 MG tablet, Take 1 tablet by mouth daily., Disp: 90  tablet, Rfl: 3   Vitamin D , Ergocalciferol , (DRISDOL ) 1.25 MG (50000 UNIT) CAPS capsule, Take 1 capsule (50,000 Units total) by mouth every 7 (seven) days., Disp: 13 capsule, Rfl: 1   ALPRAZolam  (XANAX ) 0.5 MG tablet, Take 1-2 tablets 30-60 minutes prior to MRI. Can take 1 additional tablet at time of MRI if needed. Must have driver, can cause drowsiness. (Patient not taking: Reported on 07/26/2024), Disp: 3 tablet, Rfl: 0   oxybutynin  (DITROPAN  XL) 10 MG 24 hr tablet, Take 1 tablet (10 mg total) by mouth at bedtime. (Patient not taking: Reported on 07/26/2024), Disp: 30 tablet, Rfl: 11   traZODone  (DESYREL ) 50 MG tablet, Take 0.5-1 tablets (25-50 mg total) by mouth at bedtime as needed for sleep. (Patient not taking: Reported on 07/26/2024), Disp: 30 tablet, Rfl: 3 [2] No Known Allergies  "
